# Patient Record
Sex: Female | Born: 1955
Health system: Southern US, Community
[De-identification: ages and names within clinical notes are randomized; demographics above are authoritative.]

## PROBLEM LIST (undated history)

## (undated) DIAGNOSIS — K219 Gastro-esophageal reflux disease without esophagitis: Secondary | ICD-10-CM

## (undated) DIAGNOSIS — M1990 Unspecified osteoarthritis, unspecified site: Secondary | ICD-10-CM

## (undated) DIAGNOSIS — M1612 Unilateral primary osteoarthritis, left hip: Secondary | ICD-10-CM

## (undated) DIAGNOSIS — R0602 Shortness of breath: Secondary | ICD-10-CM

## (undated) DIAGNOSIS — G5600 Carpal tunnel syndrome, unspecified upper limb: Secondary | ICD-10-CM

## (undated) DIAGNOSIS — R569 Unspecified convulsions: Secondary | ICD-10-CM

## (undated) DIAGNOSIS — I10 Essential (primary) hypertension: Secondary | ICD-10-CM

## (undated) DIAGNOSIS — K589 Irritable bowel syndrome without diarrhea: Secondary | ICD-10-CM

## (undated) DIAGNOSIS — T148XXA Other injury of unspecified body region, initial encounter: Secondary | ICD-10-CM

## (undated) HISTORY — PX: FOOT ARTHROTOMY: SUR104

## (undated) HISTORY — DX: Irritable bowel syndrome, unspecified: K58.9

## (undated) HISTORY — PX: ANTERIOR CERVICAL DECOMP/DISCECTOMY FUSION: SHX1161

## (undated) HISTORY — PX: OTHER SURGICAL HISTORY: SHX169

## (undated) HISTORY — PX: VAGINAL HYSTERECTOMY: SUR661

## (undated) HISTORY — DX: Carpal tunnel syndrome, unspecified upper limb: G56.00

## (undated) HISTORY — PX: JOINT REPLACEMENT: SHX530

---

## 1976-12-09 HISTORY — PX: TUBAL LIGATION: SHX77

## 2001-12-09 HISTORY — PX: CHOLECYSTECTOMY: SHX55

## 2002-03-30 ENCOUNTER — Encounter: Payer: Self-pay | Admitting: Unknown Physician Specialty

## 2002-03-30 ENCOUNTER — Ambulatory Visit (HOSPITAL_COMMUNITY): Admission: RE | Admit: 2002-03-30 | Discharge: 2002-03-30 | Payer: Self-pay | Admitting: Unknown Physician Specialty

## 2002-05-02 ENCOUNTER — Encounter: Payer: Self-pay | Admitting: Orthopaedic Surgery

## 2002-05-02 ENCOUNTER — Ambulatory Visit (HOSPITAL_COMMUNITY): Admission: RE | Admit: 2002-05-02 | Discharge: 2002-05-02 | Payer: Self-pay | Admitting: Orthopaedic Surgery

## 2002-05-31 ENCOUNTER — Inpatient Hospital Stay (HOSPITAL_COMMUNITY): Admission: RE | Admit: 2002-05-31 | Discharge: 2002-06-01 | Payer: Self-pay | Admitting: Neurosurgery

## 2002-10-12 ENCOUNTER — Encounter: Admission: RE | Admit: 2002-10-12 | Discharge: 2002-12-01 | Payer: Self-pay | Admitting: Neurosurgery

## 2003-04-01 ENCOUNTER — Encounter: Payer: Self-pay | Admitting: Unknown Physician Specialty

## 2003-04-01 ENCOUNTER — Ambulatory Visit (HOSPITAL_COMMUNITY): Admission: RE | Admit: 2003-04-01 | Discharge: 2003-04-01 | Payer: Self-pay | Admitting: Unknown Physician Specialty

## 2007-04-28 ENCOUNTER — Ambulatory Visit (HOSPITAL_COMMUNITY): Admission: RE | Admit: 2007-04-28 | Discharge: 2007-04-28 | Payer: Self-pay | Admitting: Family Medicine

## 2009-02-22 ENCOUNTER — Ambulatory Visit (HOSPITAL_COMMUNITY): Admission: RE | Admit: 2009-02-22 | Discharge: 2009-02-22 | Payer: Self-pay | Admitting: Family Medicine

## 2011-04-26 NOTE — Op Note (Signed)
. [facility]  Patient:    Kathy Barr, Kathy Barr Visit Number: [id number] MRN: [id number]          Service Type: SUR Location: [id number] Attending Physician:  Cristi Loron Dictated by:   Cristi Loron, M.D. Proc. Date: 05/31/02 Admit Date:  05/31/2002 Discharge Date: 06/01/2002                             Operative Report  BRIEF HISTORY:  The patient is a 55 year old white female who has suffered from neck and right arm pain.  She failed medical management and was worked up with a cervical MRI that demonstrated a herniated disk at C6-7 on the right. The patients signs, symptoms and physical examination are consistent with a right C7 radiculopathy.  She therefore weighed the risks, benefits and alternatives of surgery and decided to proceed with an anterior cervical diskectomy, fusion and plating.  PREOPERATIVE DIAGNOSES: 1. C6-7 herniated nucleus pulposus. 2. Spinal stenosis. 3. Degenerative disease. 4. Cervical radiculopathy and cervicalgia.  POSTOPERATIVE DIAGNOSES: 1. C6-7 herniated nucleus pulposus. 2. Spinal stenosis. 3. Degenerative disease. 4. Cervical radiculopathy and cervicalgia.  PROCEDURE: C6-7 extensive anterior cervical diskectomy, interbody iliac crest Allograft arthrodesis, anterior cervical plating (Synthes titanium plate and screws).  SURGEON:  Cristi Loron, M.D.  ASSISTANT:  Barnett Abu, M.D.  ANESTHESIA:  General endotracheal anesthesia.  ESTIMATED BLOOD LOSS: 100 cc.  SPECIMENS:  None.  DRAINS:  None.  COMPLICATIONS: None.  DESCRIPTION OF PROCEDURE: The patient was brought to the operating room by the anesthesia team.  General endotracheal anesthesia was induced.  The patient remained in the supine position. A roll was placed under her shoulder and placed in slight extension.  The anterior cervical region was then prepared with Betadine scrub and Betadine solution. Sterile drapes were  applied.  I then injected the area to be incised with Marcaine with epinephrine solution and then used the scalpel to make a transverse incision in the patients left anterior neck.  I used the Metzenbaum scissors to divide the platysma muscle and then dissect medial to the sternocleidomastoid muscle, jugular vein and carotid artery.  I carefully dissected down towards the anterior cervical spine carefully identifying the esophagus and retracting it medially.  I exposed the soft tissue from the anterior cervical spine using Kitner swabs and then inserted a bent spinal needle into the upper exposed interspace.  I then obtained an intraoperative radiograph to confirm the location and then used electrocautery to detach the medial border up the longus coli muscle bilaterally from the C6-7 intervertebral disk space.  I then inserted the Caspar self-retaining retractor for exposure and then incised the C6-7 intervertebral disk with a 15 blade scalpel.  I performed a partial diskectomy using the pituitary forceps and the Carlens curettes.  I inserted the distraction screws at C6 and C7, distracting the interspace.  I then brought the operative microscope into the field and under magnification and illumination, I completed the decompression.  I used a high speed drill decorticate the few membranes at C6-7 and drilled the remainder of the C6-7 intervertebral disk.  I thinned out the posterior longitudinal ligament with the drill and then incised it with a vascular knife and removed it with the Kerrison punch.  I then incised the vertebral end plates at C5-8 decompressing the thecal sac.  I performed a foraminotomy about the left C7 nerve root and then the right C7 nerve  root.  On the right there was a combination of spondylosis and a soft herniated disk which was compressing the right C7 nerve root significantly. I performed a generous foraminotomy about the nerve root getting a good  decompression.  Having completed the decompression of the thecal sac and the bilateral C7 nerve roots, I now turned my attention to the arthrodesis.  I obtained iliac crest tricortical Allograft bone graft and fashioned it to its approximate dimension, 7 mm in height and 1 cm in depth.  I placed the bone graft into the C6-7 intervertebral disk space.  Dr. Danielle Dess carefully tapped it into place using the mount and bone tamp.  I then removed the distraction screws.  There was a good snug fit of the bone graft.  I now turned my attention to the anterior spinal instrumentation.  I obtained the appropriate length of the Synthes anterior cervical plate and laid it along the anterior aspect of the vertebral bodies at C6 and C7 and while Dr. Danielle Dess held it in place, I drilled two holes at C6 and two at C7 and tapped the holes and secured the plate to the vertebral bodies using two 14 mm screws at each vertebral body.  We then obtained an intraoperative radiograph and it was suboptimal because of the patients body habitus, but the instrumentation looked good in vivo.  We then secured the screws to the plates and the locking screws at each screw.  I then achieved hemostasis using bipolar cautery.  I copiously irrigated the wound out with Bacitracin solution.  I removed the solution and then removed the Caspar self-retaining retractor.  I inspected the esophagus for any damage; there was none apparent. I then reapproximated the patients platysma muscle with the 3-0 Vicryl suture and subcutaneous tissues with 3-0 Vicryl suture and the skin with Steri-Strips and Benzoin. The wound was then covered with Bacitracin ointment and a sterile dressing was applied.  The drapes were removed.  The patient was subsequently extubated by the anesthesia team and transported to the post-anesthesia care unit in stable condition. All sponge, instrument and needle counts were correct at the end of the case. Dictated  by:   Cristi Loron, M.D. Attending Physician:  Tressie Stalker D DD:  05/31/02 TD:  06/01/02 Job: [id number]

## 2011-12-10 DIAGNOSIS — T148XXA Other injury of unspecified body region, initial encounter: Secondary | ICD-10-CM

## 2011-12-10 HISTORY — DX: Other injury of unspecified body region, initial encounter: T14.8XXA

## 2012-05-26 ENCOUNTER — Encounter (HOSPITAL_COMMUNITY): Payer: Self-pay | Admitting: Pharmacy Technician

## 2012-05-27 ENCOUNTER — Encounter (HOSPITAL_COMMUNITY): Payer: Self-pay

## 2012-05-27 ENCOUNTER — Encounter (HOSPITAL_COMMUNITY)
Admission: RE | Admit: 2012-05-27 | Discharge: 2012-05-27 | Disposition: A | Discharge status: Home / Self Care | Payer: PRIVATE HEALTH INSURANCE | Source: Ambulatory Visit | Attending: Physician Assistant | Admitting: Physician Assistant

## 2012-05-27 ENCOUNTER — Encounter (HOSPITAL_COMMUNITY)
Admission: RE | Admit: 2012-05-27 | Discharge: 2012-05-27 | Disposition: A | Discharge status: Home / Self Care | Payer: PRIVATE HEALTH INSURANCE | Source: Ambulatory Visit | Attending: Specialist | Admitting: Specialist

## 2012-05-27 DIAGNOSIS — Z01812 Encounter for preprocedural laboratory examination: Secondary | ICD-10-CM | POA: Insufficient documentation

## 2012-05-27 DIAGNOSIS — Z01818 Encounter for other preprocedural examination: Secondary | ICD-10-CM | POA: Insufficient documentation

## 2012-05-27 DIAGNOSIS — Z01810 Encounter for preprocedural cardiovascular examination: Secondary | ICD-10-CM | POA: Insufficient documentation

## 2012-05-27 DIAGNOSIS — R0602 Shortness of breath: Secondary | ICD-10-CM | POA: Insufficient documentation

## 2012-05-27 DIAGNOSIS — I10 Essential (primary) hypertension: Secondary | ICD-10-CM | POA: Insufficient documentation

## 2012-05-27 DIAGNOSIS — F17200 Nicotine dependence, unspecified, uncomplicated: Secondary | ICD-10-CM | POA: Insufficient documentation

## 2012-05-27 HISTORY — DX: Unspecified osteoarthritis, unspecified site: M19.90

## 2012-05-27 HISTORY — DX: Essential (primary) hypertension: I10

## 2012-05-27 HISTORY — DX: Shortness of breath: R06.02

## 2012-05-27 LAB — CBC
Hemoglobin: 13.5 g/dL (ref 12.0–15.0)
MCV: 82.8 fL (ref 78.0–100.0)
Platelets: 209 10*3/uL (ref 150–400)
RBC: 4.82 MIL/uL (ref 3.87–5.11)
WBC: 8.7 10*3/uL (ref 4.0–10.5)

## 2012-05-27 LAB — TYPE AND SCREEN: ABO/RH(D): O NEG

## 2012-05-27 LAB — COMPREHENSIVE METABOLIC PANEL
ALT: 13 U/L (ref 0–35)
AST: 17 U/L (ref 0–37)
CO2: 28 mEq/L (ref 19–32)
Chloride: 100 mEq/L (ref 96–112)
Creatinine, Ser: 0.66 mg/dL (ref 0.50–1.10)
GFR calc non Af Amer: >90 mL/min (ref >90)
Glucose, Bld: 86 mg/dL (ref 70–99)
Total Bilirubin: 0.9 mg/dL (ref 0.3–1.2)

## 2012-05-27 LAB — URINE MICROSCOPIC-ADD ON

## 2012-05-27 LAB — APTT: aPTT: 31 seconds (ref 24–37)

## 2012-05-27 LAB — URINALYSIS, ROUTINE W REFLEX MICROSCOPIC
Hgb urine dipstick: NEGATIVE
Protein, ur: NEGATIVE mg/dL
Urobilinogen, UA: 0.2 mg/dL (ref 0.0–1.0)

## 2012-05-27 NOTE — Progress Notes (Signed)
ekg at [facility]. ED for chest pain & told that it was muscle strain. >1 yr. Ago

## 2012-05-27 NOTE — Pre-Procedure Instructions (Signed)
20 SHATYRA BECKA  05/27/2012   Your procedure is scheduled on:  06/02/2012  Report to [facility] at 10:30 AM.  Call this number if you have problems the morning of surgery: [phone]   Remember:   Do not eat food:After Midnight.  MONDAY    Take these medicines the morning of surgery with A SIP OF WATER:  NOTHING   Do not wear jewelry, make-up or nail polish.  Do not wear lotions, powders, or perfumes. You may wear deodorant.  Do not shave 48 hours prior to surgery. Men may shave face and neck.  Do not bring valuables to the hospital.  Contacts, dentures or bridgework may not be worn into surgery.  Leave suitcase in the car. After surgery it may be brought to your room.  For patients admitted to the hospital, checkout time is 11:00 AM the day of discharge.   Patients discharged the day of surgery will not be allowed to drive home.  Name and phone number of your driver: /w boyfriend   Special Instructions: CHG Shower Use Special Wash: 1/2 bottle night before surgery and 1/2 bottle morning of surgery.   Please read over the following fact sheets that you were given: Pain Booklet, Coughing and Deep Breathing, Blood Transfusion Information, Total Joint Packet, MRSA Information and Surgical Site Infection Prevention

## 2012-06-01 DIAGNOSIS — M1612 Unilateral primary osteoarthritis, left hip: Secondary | ICD-10-CM | POA: Diagnosis present

## 2012-06-01 MED ORDER — CEFAZOLIN SODIUM-DEXTROSE 2-3 GM-% IV SOLR: 2.0000 g | INTRAVENOUS | Status: DC

## 2012-06-01 NOTE — H&P (Signed)
Kathy Barr is an 56 y.o. female.   Chief Complaint: left hip pain HPI: Pt with chronic and progressive pain in the left hip.  Now with worsening of her symptoms with inability to ambulate distances.  She has difficulty rising from a seated position and putting on her shoes.  Radiographs show severe degenerative changes of the left hip with loss of joint space.  She has failed conservative treatment with NSAIDS and activity modification.  She would like to proceed with left total hip replacement.  Past Medical History  Diagnosis Date  . Hypertension   . Shortness of breath   . Arthritis     L hip- osteoarthritis    Past Surgical History  Procedure Date  . Cervical fusion   . Tubal ligation     1978  . Foot arthrotomy     R foot - pin, after removing a bone   . Abdominal hysterectomy   . Cholecystectomy     2003    No family history on file. Social History:  reports that she has been smoking Cigarettes.  She has been smoking about 1 pack per day. She does not have any smokeless tobacco history on file. She reports that she does not drink alcohol or use illicit drugs.  Allergies: No Known Allergies  No prescriptions prior to admission    No results found for this or any previous visit (from the past 48 hour(s)). No results found.  Review of Systems  Constitutional: Negative.   HENT: Negative.   Eyes: Negative.   Cardiovascular: Negative.   Gastrointestinal: Negative.   Genitourinary: Negative.   Musculoskeletal: Positive for joint pain.       Left hip  Skin: Negative.   Neurological: Negative.   Endo/Heme/Allergies: Negative.   Psychiatric/Behavioral: Negative.     There were no vitals taken for this visit. Physical Exam  Constitutional: She is oriented to person, place, and time. She appears well-developed and well-nourished.  HENT:  Head: Normocephalic and atraumatic.  Eyes: EOM are normal. Pupils are equal, round, and reactive to light.  Neck: Normal range  of motion. Neck supple.  Cardiovascular: Normal rate, regular rhythm, normal heart sounds and intact distal pulses.   Respiratory: Effort normal and breath sounds normal.  GI: Soft. Bowel sounds are normal.  Musculoskeletal:       Limited and painful ROM left hip.  Pain radiating to groin and down anterior thigh with weight bearing of left hip  Neurological: She is alert and oriented to person, place, and time.  Skin: Skin is warm and dry.  Psychiatric: She has a normal mood and affect.     Assessment/Plan Osteoarthritis of left hip  PLAN:  Left total hip replacement.  Wende Neighbors 06/01/2012, 3:43 PM

## 2012-06-01 NOTE — H&P (Signed)
Reviewed, jen 

## 2012-06-02 ENCOUNTER — Encounter (HOSPITAL_COMMUNITY): Admission: RE | Payer: Self-pay | Source: Ambulatory Visit

## 2012-06-02 ENCOUNTER — Ambulatory Visit (HOSPITAL_COMMUNITY): Admission: RE | Admit: 2012-06-02 | Payer: PRIVATE HEALTH INSURANCE | Source: Ambulatory Visit | Admitting: Specialist

## 2012-06-02 SURGERY — ARTHROPLASTY, HIP, TOTAL,POSTERIOR APPROACH
Anesthesia: General | Laterality: Left

## 2012-06-16 ENCOUNTER — Encounter (HOSPITAL_COMMUNITY): Payer: Self-pay | Admitting: Pharmacy Technician

## 2012-06-24 ENCOUNTER — Inpatient Hospital Stay (HOSPITAL_COMMUNITY): Admission: RE | Admit: 2012-06-24 | Discharge: 2012-06-24 | Payer: PRIVATE HEALTH INSURANCE | Source: Ambulatory Visit

## 2012-06-24 ENCOUNTER — Encounter (HOSPITAL_COMMUNITY): Payer: Self-pay

## 2012-06-24 HISTORY — DX: Other injury of unspecified body region, initial encounter: T14.8XXA

## 2012-06-24 NOTE — Pre-Procedure Instructions (Signed)
20 Kathy Barr  06/24/2012   Your procedure is scheduled on:  06/30/12  Report to [facility] at 1000AM.  Call this number if you have problems the morning of surgery: [phone]   Remember:   Do not eat food or drink:After Midnight.  .  Take these medicines the morning of surgery with A SIP OF WATER:none   STOP naproxen, any other nsaids, aspirin, herbal meds ,blood thinners*   Do not wear jewelry, make-up or nail polish.  Do not wear lotions, powders, or perfumes. You may wear deodorant.  Do not shave 48 hours prior to surgery. Men may shave face and neck.  Do not bring valuables to the hospital.  Contacts, dentures or bridgework may not be worn into surgery.  Leave suitcase in the car. After surgery it may be brought to your room.  For patients admitted to the hospital, checkout time is 11:00 AM the day of discharge.   Patients discharged the day of surgery will not be allowed to drive home.  Name and phone number of your driver:  Special Instructions: Incentive Spirometry - Practice and bring it with you on the day of surgery. and CHG Shower Use Special Wash: 1/2 bottle night before surgery and 1/2 bottle morning of surgery.   Please read over the following fact sheets that you were given: Pain Booklet, Coughing and Deep Breathing, Blood Transfusion Information, Total Joint Packet, MRSA Information and Surgical Site Infection Prevention

## 2012-06-24 NOTE — Progress Notes (Addendum)
Patient told by sheila pa at office did not have to come back for preadmit appt Office also called for new orders. Spoke with sherry

## 2012-06-29 MED ORDER — CHLORHEXIDINE GLUCONATE 4 % EX LIQD: 60.0000 mL | Freq: Once | CUTANEOUS | Status: DC

## 2012-06-29 MED ORDER — CEFAZOLIN SODIUM-DEXTROSE 2-3 GM-% IV SOLR
2.0000 g | INTRAVENOUS | Status: AC
  Administered 2012-06-30: 2 g via INTRAVENOUS
  Filled 2012-06-29: qty 50

## 2012-06-30 ENCOUNTER — Inpatient Hospital Stay (HOSPITAL_COMMUNITY)
Admission: RE | Admit: 2012-06-30 | Discharge: 2012-07-03 | DRG: 470 | Disposition: A | Discharge status: Home Health | Payer: PRIVATE HEALTH INSURANCE | Source: Ambulatory Visit | Attending: Specialist | Admitting: Specialist

## 2012-06-30 ENCOUNTER — Inpatient Hospital Stay (HOSPITAL_COMMUNITY): Payer: PRIVATE HEALTH INSURANCE

## 2012-06-30 ENCOUNTER — Encounter (HOSPITAL_COMMUNITY)
Admission: RE | Disposition: A | Discharge status: Home Health | Payer: Self-pay | Source: Ambulatory Visit | Attending: Specialist

## 2012-06-30 ENCOUNTER — Encounter (HOSPITAL_COMMUNITY): Payer: Self-pay | Admitting: *Deleted

## 2012-06-30 ENCOUNTER — Encounter (HOSPITAL_COMMUNITY): Payer: Self-pay | Admitting: Anesthesiology

## 2012-06-30 ENCOUNTER — Ambulatory Visit (HOSPITAL_COMMUNITY): Payer: PRIVATE HEALTH INSURANCE | Admitting: Anesthesiology

## 2012-06-30 DIAGNOSIS — Z23 Encounter for immunization: Secondary | ICD-10-CM

## 2012-06-30 DIAGNOSIS — M1612 Unilateral primary osteoarthritis, left hip: Secondary | ICD-10-CM | POA: Diagnosis present

## 2012-06-30 DIAGNOSIS — Q762 Congenital spondylolisthesis: Secondary | ICD-10-CM

## 2012-06-30 DIAGNOSIS — Z79899 Other long term (current) drug therapy: Secondary | ICD-10-CM

## 2012-06-30 DIAGNOSIS — I10 Essential (primary) hypertension: Secondary | ICD-10-CM | POA: Diagnosis present

## 2012-06-30 DIAGNOSIS — D62 Acute posthemorrhagic anemia: Secondary | ICD-10-CM | POA: Diagnosis not present

## 2012-06-30 DIAGNOSIS — Z981 Arthrodesis status: Secondary | ICD-10-CM

## 2012-06-30 DIAGNOSIS — M87059 Idiopathic aseptic necrosis of unspecified femur: Secondary | ICD-10-CM | POA: Diagnosis present

## 2012-06-30 DIAGNOSIS — M169 Osteoarthritis of hip, unspecified: Principal | ICD-10-CM | POA: Diagnosis present

## 2012-06-30 DIAGNOSIS — F17200 Nicotine dependence, unspecified, uncomplicated: Secondary | ICD-10-CM | POA: Diagnosis present

## 2012-06-30 DIAGNOSIS — Z7901 Long term (current) use of anticoagulants: Secondary | ICD-10-CM

## 2012-06-30 DIAGNOSIS — M1610 Unilateral primary osteoarthritis, unspecified hip: Principal | ICD-10-CM | POA: Diagnosis present

## 2012-06-30 HISTORY — PX: TOTAL HIP ARTHROPLASTY: SHX124

## 2012-06-30 LAB — BASIC METABOLIC PANEL
CO2: 29 mEq/L (ref 19–32)
Calcium: 9.8 mg/dL (ref 8.4–10.5)
Creatinine, Ser: 0.67 mg/dL (ref 0.50–1.10)
GFR calc non Af Amer: >90 mL/min (ref >90)
Sodium: 142 mEq/L (ref 135–145)

## 2012-06-30 LAB — TYPE AND SCREEN
ABO/RH(D): O NEG
Antibody Screen: NEGATIVE

## 2012-06-30 LAB — CBC
MCH: 28.3 pg (ref 26.0–34.0)
MCHC: 34.5 g/dL (ref 30.0–36.0)
MCV: 81.9 fL (ref 78.0–100.0)
Platelets: 233 10*3/uL (ref 150–400)
RBC: 4.92 MIL/uL (ref 3.87–5.11)
RDW: 13.5 % (ref 11.5–15.5)

## 2012-06-30 LAB — PROTIME-INR
INR: 1.03 (ref 0.00–1.49)
Prothrombin Time: 13.7 seconds (ref 11.6–15.2)

## 2012-06-30 LAB — SURGICAL PCR SCREEN: MRSA, PCR: NEGATIVE

## 2012-06-30 SURGERY — ARTHROPLASTY, HIP, TOTAL,POSTERIOR APPROACH
Anesthesia: General | Site: Hip | Laterality: Left | Wound class: Clean

## 2012-06-30 MED ORDER — MUPIROCIN 2 % EX OINT
TOPICAL_OINTMENT | Freq: Two times a day (BID) | CUTANEOUS | Status: DC
  Administered 2012-06-30: 10:00:00 via NASAL

## 2012-06-30 MED ORDER — ACETAMINOPHEN 650 MG RE SUPP: 650.0000 mg | Freq: Four times a day (QID) | RECTAL | Status: DC | PRN

## 2012-06-30 MED ORDER — METHOCARBAMOL 100 MG/ML IJ SOLN
500.0000 mg | Freq: Four times a day (QID) | INTRAVENOUS | Status: DC | PRN
  Filled 2012-06-30: qty 5

## 2012-06-30 MED ORDER — ALUM & MAG HYDROXIDE-SIMETH 200-200-20 MG/5ML PO SUSP: 30.0000 mL | ORAL | Status: DC | PRN

## 2012-06-30 MED ORDER — METOCLOPRAMIDE HCL 10 MG PO TABS: 5.0000 mg | ORAL_TABLET | Freq: Three times a day (TID) | ORAL | Status: DC | PRN

## 2012-06-30 MED ORDER — ONDANSETRON HCL 4 MG/2ML IJ SOLN
INTRAMUSCULAR | Status: DC | PRN
  Administered 2012-06-30: 4 mg via INTRAVENOUS

## 2012-06-30 MED ORDER — ONDANSETRON HCL 4 MG/2ML IJ SOLN: 4.0000 mg | Freq: Four times a day (QID) | INTRAMUSCULAR | Status: DC | PRN

## 2012-06-30 MED ORDER — DEXTROSE 5 % IV SOLN
INTRAVENOUS | Status: DC | PRN
  Administered 2012-06-30: 13:00:00 via INTRAVENOUS

## 2012-06-30 MED ORDER — ENOXAPARIN SODIUM 30 MG/0.3ML ~~LOC~~ SOLN
30.0000 mg | Freq: Two times a day (BID) | SUBCUTANEOUS | Status: DC
Start: 2012-07-01 — End: 2012-07-03
  Administered 2012-07-01 – 2012-07-03 (×5): 30 mg via SUBCUTANEOUS
  Filled 2012-06-30 (×7): qty 0.3

## 2012-06-30 MED ORDER — CEFAZOLIN SODIUM 1-5 GM-% IV SOLN
1.0000 g | Freq: Four times a day (QID) | INTRAVENOUS | Status: AC
  Administered 2012-06-30 – 2012-07-01 (×2): 1 g via INTRAVENOUS
  Filled 2012-06-30 (×2): qty 50

## 2012-06-30 MED ORDER — OXYCODONE HCL 5 MG PO TABS
5.0000 mg | ORAL_TABLET | ORAL | Status: DC | PRN
  Administered 2012-07-01 – 2012-07-03 (×13): 10 mg via ORAL
  Filled 2012-06-30 (×13): qty 2

## 2012-06-30 MED ORDER — PROPOFOL 10 MG/ML IV EMUL
INTRAVENOUS | Status: DC | PRN
  Administered 2012-06-30: 160 mg via INTRAVENOUS

## 2012-06-30 MED ORDER — LIDOCAINE HCL (CARDIAC) 20 MG/ML IV SOLN
INTRAVENOUS | Status: DC | PRN
  Administered 2012-06-30: 100 mg via INTRAVENOUS

## 2012-06-30 MED ORDER — MORPHINE SULFATE (PF) 1 MG/ML IV SOLN
INTRAVENOUS | Status: DC
  Administered 2012-06-30: 25 mL via INTRAVENOUS
  Administered 2012-07-01: 05:00:00 via INTRAVENOUS
  Administered 2012-07-01: 3 mg via INTRAVENOUS
  Filled 2012-06-30: qty 25

## 2012-06-30 MED ORDER — HYDROMORPHONE HCL PF 1 MG/ML IJ SOLN
0.2500 mg | INTRAMUSCULAR | Status: DC | PRN
  Administered 2012-06-30 (×2): 0.5 mg via INTRAVENOUS

## 2012-06-30 MED ORDER — LACTATED RINGERS IV SOLN
INTRAVENOUS | Status: DC | PRN
  Administered 2012-06-30 (×2): via INTRAVENOUS

## 2012-06-30 MED ORDER — CHLORHEXIDINE GLUCONATE 4 % EX LIQD: 60.0000 mL | Freq: Once | CUTANEOUS | Status: DC

## 2012-06-30 MED ORDER — COUMADIN BOOK
Freq: Once | Status: DC
  Filled 2012-06-30: qty 1

## 2012-06-30 MED ORDER — WARFARIN - PHARMACIST DOSING INPATIENT: Freq: Every day | Status: DC

## 2012-06-30 MED ORDER — DIPHENHYDRAMINE HCL 12.5 MG/5ML PO ELIX: 12.5000 mg | ORAL_SOLUTION | ORAL | Status: DC | PRN

## 2012-06-30 MED ORDER — VALSARTAN-HYDROCHLOROTHIAZIDE 160-25 MG PO TABS: 1.0000 | ORAL_TABLET | Freq: Every day | ORAL | Status: DC

## 2012-06-30 MED ORDER — ONDANSETRON HCL 4 MG PO TABS: 4.0000 mg | ORAL_TABLET | Freq: Four times a day (QID) | ORAL | Status: DC | PRN

## 2012-06-30 MED ORDER — PHENOL 1.4 % MT LIQD: 1.0000 | OROMUCOSAL | Status: DC | PRN

## 2012-06-30 MED ORDER — HYDROCHLOROTHIAZIDE 25 MG PO TABS
25.0000 mg | ORAL_TABLET | Freq: Every day | ORAL | Status: DC
  Administered 2012-07-02: 25 mg via ORAL
  Filled 2012-06-30 (×3): qty 1

## 2012-06-30 MED ORDER — LACTATED RINGERS IV SOLN
INTRAVENOUS | Status: DC
  Administered 2012-06-30: 11:00:00 via INTRAVENOUS

## 2012-06-30 MED ORDER — METHOCARBAMOL 500 MG PO TABS
500.0000 mg | ORAL_TABLET | Freq: Four times a day (QID) | ORAL | Status: DC | PRN
  Administered 2012-07-01 (×2): 500 mg via ORAL
  Filled 2012-06-30 (×2): qty 1

## 2012-06-30 MED ORDER — SODIUM CHLORIDE 0.9 % IJ SOLN: 9.0000 mL | INTRAMUSCULAR | Status: DC | PRN

## 2012-06-30 MED ORDER — SODIUM CHLORIDE 0.9 % IR SOLN
Status: DC | PRN
  Administered 2012-06-30: 1000 mL

## 2012-06-30 MED ORDER — ACETAMINOPHEN 325 MG PO TABS: 650.0000 mg | ORAL_TABLET | Freq: Four times a day (QID) | ORAL | Status: DC | PRN

## 2012-06-30 MED ORDER — HYDROMORPHONE HCL PF 1 MG/ML IJ SOLN
INTRAMUSCULAR | Status: AC
  Filled 2012-06-30: qty 1

## 2012-06-30 MED ORDER — KETOROLAC TROMETHAMINE 30 MG/ML IJ SOLN
15.0000 mg | Freq: Once | INTRAMUSCULAR | Status: AC | PRN
  Administered 2012-06-30: 30 mg via INTRAVENOUS

## 2012-06-30 MED ORDER — NALOXONE HCL 0.4 MG/ML IJ SOLN: 0.4000 mg | INTRAMUSCULAR | Status: DC | PRN

## 2012-06-30 MED ORDER — PROMETHAZINE HCL 25 MG/ML IJ SOLN: 6.2500 mg | INTRAMUSCULAR | Status: DC | PRN

## 2012-06-30 MED ORDER — FENTANYL CITRATE 0.05 MG/ML IJ SOLN
INTRAMUSCULAR | Status: DC | PRN
  Administered 2012-06-30: 100 ug via INTRAVENOUS
  Administered 2012-06-30: 25 ug via INTRAVENOUS
  Administered 2012-06-30 (×2): 50 ug via INTRAVENOUS
  Administered 2012-06-30: 25 ug via INTRAVENOUS

## 2012-06-30 MED ORDER — WARFARIN SODIUM 7.5 MG PO TABS
7.5000 mg | ORAL_TABLET | Freq: Once | ORAL | Status: AC
  Administered 2012-06-30: 7.5 mg via ORAL
  Filled 2012-06-30: qty 1

## 2012-06-30 MED ORDER — MIDAZOLAM HCL 5 MG/5ML IJ SOLN
INTRAMUSCULAR | Status: DC | PRN
  Administered 2012-06-30 (×2): 0.5 mg via INTRAVENOUS

## 2012-06-30 MED ORDER — MENTHOL 3 MG MT LOZG: 1.0000 | LOZENGE | OROMUCOSAL | Status: DC | PRN

## 2012-06-30 MED ORDER — KETOROLAC TROMETHAMINE 30 MG/ML IJ SOLN
INTRAMUSCULAR | Status: AC
  Filled 2012-06-30: qty 1

## 2012-06-30 MED ORDER — MUPIROCIN 2 % EX OINT
TOPICAL_OINTMENT | CUTANEOUS | Status: AC
  Filled 2012-06-30: qty 22

## 2012-06-30 MED ORDER — MEPERIDINE HCL 25 MG/ML IJ SOLN: 6.2500 mg | INTRAMUSCULAR | Status: DC | PRN

## 2012-06-30 MED ORDER — POLYETHYLENE GLYCOL 3350 17 G PO PACK: 17.0000 g | PACK | Freq: Every day | ORAL | Status: DC | PRN

## 2012-06-30 MED ORDER — IRBESARTAN 150 MG PO TABS
150.0000 mg | ORAL_TABLET | Freq: Every day | ORAL | Status: DC
  Administered 2012-07-02: 150 mg via ORAL
  Filled 2012-06-30 (×3): qty 1

## 2012-06-30 MED ORDER — POTASSIUM CHLORIDE IN NACL 20-0.9 MEQ/L-% IV SOLN
INTRAVENOUS | Status: DC
  Administered 2012-06-30 – 2012-07-01 (×2): via INTRAVENOUS
  Filled 2012-06-30 (×7): qty 1000

## 2012-06-30 MED ORDER — TEMAZEPAM 15 MG PO CAPS: 15.0000 mg | ORAL_CAPSULE | Freq: Every evening | ORAL | Status: DC | PRN

## 2012-06-30 MED ORDER — DIPHENHYDRAMINE HCL 50 MG/ML IJ SOLN: 12.5000 mg | Freq: Four times a day (QID) | INTRAMUSCULAR | Status: DC | PRN

## 2012-06-30 MED ORDER — ROCURONIUM BROMIDE 100 MG/10ML IV SOLN
INTRAVENOUS | Status: DC | PRN
  Administered 2012-06-30: 50 mg via INTRAVENOUS
  Administered 2012-06-30: 10 mg via INTRAVENOUS

## 2012-06-30 MED ORDER — WARFARIN VIDEO: Freq: Once | Status: DC

## 2012-06-30 MED ORDER — SORBITOL 70 % SOLN: 30.0000 mL | Freq: Every day | Status: DC | PRN

## 2012-06-30 MED ORDER — MORPHINE SULFATE 2 MG/ML IJ SOLN: 1.0000 mg | INTRAMUSCULAR | Status: DC | PRN

## 2012-06-30 MED ORDER — MORPHINE SULFATE (PF) 1 MG/ML IV SOLN
INTRAVENOUS | Status: AC
  Filled 2012-06-30: qty 25

## 2012-06-30 MED ORDER — DOCUSATE SODIUM 100 MG PO CAPS
100.0000 mg | ORAL_CAPSULE | Freq: Two times a day (BID) | ORAL | Status: DC
  Administered 2012-06-30 – 2012-07-03 (×6): 100 mg via ORAL
  Filled 2012-06-30 (×7): qty 1

## 2012-06-30 MED ORDER — GABAPENTIN 300 MG PO CAPS
300.0000 mg | ORAL_CAPSULE | Freq: Every day | ORAL | Status: DC
  Administered 2012-06-30 – 2012-07-02 (×3): 300 mg via ORAL
  Filled 2012-06-30 (×4): qty 1

## 2012-06-30 MED ORDER — METOCLOPRAMIDE HCL 5 MG/ML IJ SOLN
5.0000 mg | Freq: Three times a day (TID) | INTRAMUSCULAR | Status: DC | PRN
  Administered 2012-06-30: 10 mg via INTRAVENOUS

## 2012-06-30 MED ORDER — DIPHENHYDRAMINE HCL 12.5 MG/5ML PO ELIX: 12.5000 mg | ORAL_SOLUTION | Freq: Four times a day (QID) | ORAL | Status: DC | PRN

## 2012-06-30 MED ORDER — CELECOXIB 200 MG PO CAPS
200.0000 mg | ORAL_CAPSULE | Freq: Two times a day (BID) | ORAL | Status: DC
  Administered 2012-06-30 – 2012-07-03 (×6): 200 mg via ORAL
  Filled 2012-06-30 (×7): qty 1

## 2012-06-30 SURGICAL SUPPLY — 64 items
ADH SKN CLS APL DERMABOND .7 (GAUZE/BANDAGES/DRESSINGS) ×1
APL SKNCLS STERI-STRIP NONHPOA (GAUZE/BANDAGES/DRESSINGS) ×1
BENZOIN TINCTURE PRP APPL 2/3 (GAUZE/BANDAGES/DRESSINGS) ×2 IMPLANT
BLADE SAW SAG 73X25 THK (BLADE) ×1
BLADE SAW SGTL 73X25 THK (BLADE) ×1 IMPLANT
BRUSH FEMORAL CANAL (MISCELLANEOUS) IMPLANT
CLOTH BEACON ORANGE TIMEOUT ST (SAFETY) ×2 IMPLANT
COVER BACK TABLE 24X17X13 BIG (DRAPES) ×1 IMPLANT
COVER SURGICAL LIGHT HANDLE (MISCELLANEOUS) ×2 IMPLANT
DERMABOND ADVANCED (GAUZE/BANDAGES/DRESSINGS) ×1
DERMABOND ADVANCED .7 DNX12 (GAUZE/BANDAGES/DRESSINGS) ×1 IMPLANT
DRAPE INCISE IOBAN 66X45 STRL (DRAPES) ×2 IMPLANT
DRAPE ORTHO SPLIT 77X108 STRL (DRAPES) ×4
DRAPE SURG ORHT 6 SPLT 77X108 (DRAPES) ×2 IMPLANT
DRAPE U-SHAPE 47X51 STRL (DRAPES) ×2 IMPLANT
DRILL BIT 7/64X5 (BIT) ×2 IMPLANT
DRSG MEPILEX BORDER 4X12 (GAUZE/BANDAGES/DRESSINGS) ×1 IMPLANT
DRSG MEPILEX BORDER 4X4 (GAUZE/BANDAGES/DRESSINGS) ×1 IMPLANT
DRSG MEPILEX BORDER 4X8 (GAUZE/BANDAGES/DRESSINGS) ×2 IMPLANT
DURAPREP 26ML APPLICATOR (WOUND CARE) ×2 IMPLANT
ELECT BLADE 6.5 EXT (BLADE) IMPLANT
ELECT REM PT RETURN 9FT ADLT (ELECTROSURGICAL) ×2
ELECTRODE REM PT RTRN 9FT ADLT (ELECTROSURGICAL) ×1 IMPLANT
EVACUATOR 1/8 PVC DRAIN (DRAIN) ×2 IMPLANT
FACESHIELD LNG OPTICON STERILE (SAFETY) ×4 IMPLANT
FILTER STRAW FLUID ASPIR (MISCELLANEOUS) ×2 IMPLANT
GLOVE BIOGEL PI IND STRL 7.5 (GLOVE) ×1 IMPLANT
GLOVE BIOGEL PI INDICATOR 7.5 (GLOVE) ×1
GLOVE ECLIPSE 7.0 STRL STRAW (GLOVE) ×2 IMPLANT
GLOVE ECLIPSE 8.5 STRL (GLOVE) ×2 IMPLANT
GLOVE SURG 8.5 LATEX PF (GLOVE) ×2 IMPLANT
GOWN PREVENTION PLUS LG XLONG (DISPOSABLE) ×2 IMPLANT
GOWN PREVENTION PLUS XXLARGE (GOWN DISPOSABLE) ×4 IMPLANT
GOWN STRL NON-REIN LRG LVL3 (GOWN DISPOSABLE) ×4 IMPLANT
HANDPIECE INTERPULSE COAX TIP (DISPOSABLE)
IMMOBILIZER KNEE 20 (SOFTGOODS) ×2
IMMOBILIZER KNEE 20 THIGH 36 (SOFTGOODS) ×1 IMPLANT
KIT BASIN OR (CUSTOM PROCEDURE TRAY) ×2 IMPLANT
KIT ROOM TURNOVER OR (KITS) ×2 IMPLANT
MANIFOLD NEPTUNE II (INSTRUMENTS) ×2 IMPLANT
NEEDLE 22X1 1/2 (OR ONLY) (NEEDLE) ×2 IMPLANT
NS IRRIG 1000ML POUR BTL (IV SOLUTION) ×2 IMPLANT
PACK TOTAL JOINT (CUSTOM PROCEDURE TRAY) ×2 IMPLANT
PAD ARMBOARD 7.5X6 YLW CONV (MISCELLANEOUS) ×4 IMPLANT
PASSER SUT SWANSON 36MM LOOP (INSTRUMENTS) ×2 IMPLANT
PRESSURIZER FEMORAL UNIV (MISCELLANEOUS) IMPLANT
SET HNDPC FAN SPRY TIP SCT (DISPOSABLE) IMPLANT
SPONGE LAP 4X18 X RAY DECT (DISPOSABLE) ×2 IMPLANT
STRIP CLOSURE SKIN 1/2X4 (GAUZE/BANDAGES/DRESSINGS) ×4 IMPLANT
SUCTION FRAZIER TIP 10 FR DISP (SUCTIONS) ×2 IMPLANT
SUT ETHIBOND NAB CT1 #1 30IN (SUTURE) ×12 IMPLANT
SUT VIC AB 0 CT1 27 (SUTURE) ×4
SUT VIC AB 0 CT1 27XBRD ANBCTR (SUTURE) ×2 IMPLANT
SUT VIC AB 1 CT1 27 (SUTURE) ×4
SUT VIC AB 1 CT1 27XBRD ANBCTR (SUTURE) ×2 IMPLANT
SUT VIC AB 2-0 CT1 27 (SUTURE) ×4
SUT VIC AB 2-0 CT1 TAPERPNT 27 (SUTURE) ×2 IMPLANT
SUT VICRYL 4-0 PS2 18IN ABS (SUTURE) ×2 IMPLANT
SYR CONTROL 10ML LL (SYRINGE) ×2 IMPLANT
TOWEL OR 17X24 6PK STRL BLUE (TOWEL DISPOSABLE) ×2 IMPLANT
TOWEL OR 17X26 10 PK STRL BLUE (TOWEL DISPOSABLE) ×2 IMPLANT
TOWER CARTRIDGE SMART MIX (DISPOSABLE) IMPLANT
TRAY FOLEY CATH 14FR (SET/KITS/TRAYS/PACK) ×2 IMPLANT
WATER STERILE IRR 1000ML POUR (IV SOLUTION) ×8 IMPLANT

## 2012-06-30 NOTE — Op Note (Signed)
06/30/2012  2:32 PM  OPERATIVE REPORT   PATIENT:  Kathy Barr  56 y.o. female  MRN: [id number]  PRE-OPERATIVE DIAGNOSIS:  Severe osteoarthritis left hip  POST-OPERATIVE DIAGNOSIS:  Severe osteoarthritis left hip  PROCEDURE:  Procedure(s): TOTAL HIP ARTHROPLASTY Left, Depuy summit #4 pore coated femoral component with 54mm pinnacle pore coated acetabular shell +4 10 degree acetabular poly liner, -2mm 36mm femoral neck and head.     SURGEON: Kerrin Champagne, MD    ASSISTANT: Maud Deed, PA-C  (Present throughout the entire procedure and necessary for completion of procedure in a timely manner)     ANESTHESIA:  General with supplemental femoral nerve block.     COMPLICATIONS:  None.     COMPONENTS:  DePuy Summit press fit #4 femoral component, a 36 mm  outer diameter hip ball with a -2mm neck length, a 54mm outer  diameter Pinnacle acetabulum, and a polyethylene liner +4, 10-degree   posterior lip.  Components were Press-Fit.    PROCEDURE IN DETAIL: The patient was met in the holding area and  identified. The appropriate left hip was identified and marked at the operative site. Preoperative antibiotics 2 g Ancef were given . The patient was then transported to the OR and  placed under general l anesthesia. At that point, the patient was  placed in the lateral decubitus position with the operative left side up and  secured to the operating room table with the Innomed hip system.  The operative left lower extremity was prepped from the iliac crest to the distal  leg with DuraPrep. Sterile draping was  performed.  A routine southern incision was utilized and via sharp dissection  carried down to the subcutaneous tissue. Gross bleeders were Bovie  coagulated. The iliotibial band was quickly identified and incised  along the length of the skin incision. Self-retaining retractors were  inserted. With the hip internally rotated, the short external rotators  were identified.  Tendinous structures were tagged with 0 Ethibond  suture. The hip capsule was identified and incised along the femoral  neck and head. There was a moderate clear yellow joint effusion. Hip was  dislocated posteriorly. It was misshapen and subchondral fracture with cartilage  flap. The femoral neck was then osteotomized using a  calcar guide and removed from the wound. Labral resection was performed  from the acetabulum.  The femoral neck osteotomy was placed about 5 mm proximal to the lesser trochanter.  A starter hole was then made through the piriformis fossa. Canal finder was utilized then lateralizing reamer. Reaming was  performed to the appropriate size #4. I had nice endosteal purchase. Rasping was performed sequentially to the appropriate #4 uncemented rasp.  Retractors were then placed about the acetabulum. Further capsule section  was performed. There was a large degenerative labrum that was also  excised. Retractor was placed about the acetabulum. It was somewhat  misshapen because of the superior migration of the femoral head.  Reaming was performed sequentially to the 53mm size.  It had very nice bleeding circumferentially and a nice strong thick  acetabulum. I then trialed the 52 mm acetabular component. It had  complete seating. Accordingly, the trial acetabular component was  impacted into the acetabulum. It was a very nice fit. It was nice and  stable.  The trial 10 polyethylene liner was inserted followed by the femoral rasp. We trialed a number of neck lengths and felt like trial -2 mm neck and 36 mm ball was the  most stable. At that point, there was minimal toggling and complete stability. Leg lengths were appropriate.  The trial components were then removed. The joint was copiously  irrigated with saline solution. A permanent Pinnacle porous-coated 54mm acetabular implant was then impacted into place observed to be fully seated in approximately 30 of abduction and 20  anteversion. Apex hole eliminator was inserted into  the acetabular component followed by the final Marathon 10 polyethylene liner.  The femoral component was then impacted onto the calcar. Wound was again irrigated.  The trial head was then removed. We cleaned the [facility] taper neck and  inserted the final head. This was reduced, and through a full range of motion, it was perfectly stable and there was no subluxation. There was no evidence of  instability. It had a very nice construct.  Wound was then irrigated with saline solution. The capsule was closed  anatomically with #1 Ethibond. The short external rotators were closed  with similar material through drill hole to the greater trochanter using a suture passer to pass the suture. The wound was again irrigated with saline  solution. The iliotibial band was closed with reluctant #1 Ethibond, subcu  was closed with #1 Vicryl and 0 Vicryl, subcutaneous layers were reapproximated with interrupted 0 and 2-0 Vicryl sutures.Skin was closed with skin  clips. All instruments and sponge counts were correct Mepilex dressing was applied and held in place with Hypafix tape. Knee immobilizer was placed. The patient was then placed in the supine position,  awoken, placed on the operating stretcher, and returned to the  postanesthesia recovery room in satisfactory condition.   NITKA,JAMES E  06/30/2012 2:32 PM

## 2012-06-30 NOTE — Preoperative (Signed)
Beta Blockers   Reason not to administer Beta Blockers:Not Applicable 

## 2012-06-30 NOTE — Transfer of Care (Signed)
Immediate Anesthesia Transfer of Care Note  Patient: Kathy Barr  Procedure(s) Performed: Procedure(s) (LRB): TOTAL HIP ARTHROPLASTY (Left)  Patient Location: PACU  Anesthesia Type: General  Level of Consciousness: awake and sedated  Airway & Oxygen Therapy: Patient Spontanous Breathing and Patient connected to nasal cannula oxygen  Post-op Assessment: Report given to PACU RN, Post -op Vital signs reviewed and stable and Patient moving all extremities  Post vital signs: Reviewed and stable  Complications: No apparent anesthesia complications

## 2012-06-30 NOTE — Brief Op Note (Signed)
06/30/2012  2:26 PM  PATIENT:  Kathy Barr  56 y.o. female  PRE-OPERATIVE DIAGNOSIS:  Severe osteoarthritis left hip  POST-OPERATIVE DIAGNOSIS:  Severe osteoarthritis left hip  PROCEDURE:  Procedure(s) (LRB): TOTAL HIP ARTHROPLASTY (Left)Depuy summit #4 pore coated femoral component with 54mm pinnacle acetabular shell +4 10degree acetabular poly liner, -2mm 36mm femoral neck and head.  SURGEON:  Surgeon(s) and Role:    * Kerrin Champagne, MD - Primary  PHYSICIAN ASSISTANT: Maud Deed PA-C  ANESTHESIA:   general, Dr. Jacklynn Bue  EBL:  Total I/O In: 1050 [I.V.:1050] Out: 575 [Urine:300; Blood:275]  BLOOD ADMINISTERED:none  DRAINS: (one ) Hemovact drain(s) in the left hip with  Suction Open and Urinary Catheter (Foley)   LOCAL MEDICATIONS USED:  NONE  DISPOSITION OF SPECIMEN:  PATHOLOGY  COUNTS:  YES  TOURNIQUET:  * No tourniquets in log *  DICTATION: .Dragon Dictation  PLAN OF CARE: Admit to inpatient   PATIENT DISPOSITION:  PACU - hemodynamically stable.   Delay start of Pharmacological VTE agent (>24hrs) due to surgical blood loss or risk of bleeding: no

## 2012-06-30 NOTE — Anesthesia Preprocedure Evaluation (Signed)
Anesthesia Evaluation  Patient identified by MRN, date of birth, ID band Patient awake    Reviewed: Allergy & Precautions, H&P , NPO status   History of Anesthesia Complications Negative for: history of anesthetic complications  Airway Mallampati: I  Neck ROM: Full    Dental  (+) Edentulous Upper and Edentulous Lower   Pulmonary neg pulmonary ROS, shortness of breath,  breath sounds clear to auscultation        Cardiovascular hypertension, Rhythm:Regular Rate:Normal     Neuro/Psych    GI/Hepatic hiatal hernia,   Endo/Other    Renal/GU      Musculoskeletal   Abdominal (+) + obese,   Peds  Hematology   Anesthesia Other Findings   Reproductive/Obstetrics                           Anesthesia Physical Anesthesia Plan  ASA: II  Anesthesia Plan: General   Post-op Pain Management:    Induction: Intravenous  Airway Management Planned: Oral ETT  Additional Equipment:   Intra-op Plan:   Post-operative Plan: Extubation in OR  Informed Consent: I have reviewed the patients History and Physical, chart, labs and discussed the procedure including the risks, benefits and alternatives for the proposed anesthesia with the patient or authorized representative who has indicated his/her understanding and acceptance.   Dental advisory given  Plan Discussed with: CRNA and Surgeon  Anesthesia Plan Comments:         Anesthesia Quick Evaluation

## 2012-06-30 NOTE — Progress Notes (Addendum)
ANTICOAGULATION CONSULT NOTE - Initial Consult  Pharmacy Consult for coumadin Indication: VTE prophylaxis  No Known Allergies  Patient Measurements: Height: 5\' 5"  (165.1 cm) Weight: 188 lb (85.276 kg) IBW/kg (Calculated) : 57  Heparin Dosing Weight:   Vital Signs: Temp: 97 F (36.1 C) (07/23 1454) Temp src: Oral (07/23 1006) BP: 105/71 mmHg (07/23 1554) Pulse Rate: 93  (07/23 1600)  Labs:  Basename 06/30/12 1010  HGB 13.9  HCT 40.3  PLT 233  APTT --  LABPROT --  INR --  HEPARINUNFRC --  CREATININE 0.67  CKTOTAL --  CKMB --  TROPONINI --    Estimated Creatinine Clearance: 83.7 ml/min (by C-G formula based on Cr of 0.67).   Medical History: Past Medical History  Diagnosis Date  . Hypertension   . Shortness of breath   . Arthritis     L hip- osteoarthritis  . Fracture     ? rt lower leg per patient    Medications:  Scheduled:    .  ceFAZolin (ANCEF) IV  1 g Intravenous Q6H  .  ceFAZolin (ANCEF) IV  2 g Intravenous 60 min Pre-Op  . celecoxib  200 mg Oral Q12H  . docusate sodium  100 mg Oral BID  . enoxaparin (LOVENOX) injection  30 mg Subcutaneous Q12H  . gabapentin  300 mg Oral QHS  . HYDROmorphone      . ketorolac      . morphine   Intravenous Q4H  . morphine      . valsartan-hydrochlorothiazide  1 tablet Oral QAC breakfast  . DISCONTD: chlorhexidine  60 mL Topical Once  . DISCONTD: chlorhexidine  60 mL Topical Once  . DISCONTD: mupirocin ointment   Nasal BID   Infusions:    . 0.9 % NaCl with KCl 20 mEq / L    . lactated ringers 50 mL/hr at 06/30/12 1103    Assessment: 56 yo female s/p total hip arthroplasty will be started on coumadin for VTE prophylaxis.  Not on coumadin prior to admission.  Last PT/INR was in June 2013 and was <1.  Coumadin score = 4.  INR today 1.03  Goal of Therapy:  INR 2-3 Monitor platelets by anticoagulation protocol: Yes   Plan:  1) Coumadin 7.5mg  po x1 2) Stat PT/INR 3) Daily PT/INR 4) Coumadin booklet and  video. 5) D/c lovenox when INR >2  Perry Molla, Tsz-Yin 06/30/2012,4:46 PM

## 2012-06-30 NOTE — Progress Notes (Signed)
Orthopedic Tech Progress Note Patient Details:  Kathy Barr February 25, 1956 [id number] Applied overhead frame and trapeze bar. Patient ID: [name], female   DOB: 1956/11/02, 56 y.o.   MRN: [id number]   Nyana, Haren 06/30/2012, 7:38 PM

## 2012-06-30 NOTE — Anesthesia Procedure Notes (Signed)
Procedure Name: Intubation Date/Time: 06/30/2012 12:41 PM Performed by: Marni Griffon Pre-anesthesia Checklist: Patient identified, Emergency Drugs available, Suction available and Patient being monitored Patient Re-evaluated:Patient Re-evaluated prior to inductionOxygen Delivery Method: Circle system utilized Preoxygenation: Pre-oxygenation with 100% oxygen Intubation Type: IV induction Ventilation: Mask ventilation without difficulty Laryngoscope Size: Mac and 3 Grade View: Grade I Tube type: Oral Tube size: 7.5 mm Number of attempts: 1 Airway Equipment and Method: Stylet Placement Confirmation: ETT inserted through vocal cords under direct vision,  breath sounds checked- equal and bilateral and positive ETCO2 Secured at: 21 (cm at gum) cm Dental Injury: Teeth and Oropharynx as per pre-operative assessment

## 2012-06-30 NOTE — Progress Notes (Signed)
Spoke with Kathy Barr states that urine culture will be obtain in OR

## 2012-06-30 NOTE — H&P (Signed)
Kathy Barr is an 56 y.o. female.   Chief Complaint:Left hip pain. HPI: 55 year old female with several year history of left hip pain. Work up of low back with L4-5 spondylolisthesis Complains of intermittant neurogenic symptoms. No bowel or bladder symptoms. MRI of the left hip and xrays show severe Arthrosis changes associated with AVN of the left femoral head. Intra articular injection with only temporal relief of Pain. Severe limitation of standing walking and stair climbing and ability to reach her feet. She uses assist devices A cane for ambulation. Requires narcotic medication to relief her pain.  Past Medical History  Diagnosis Date  . Hypertension   . Shortness of breath   . Arthritis     L hip- osteoarthritis  . Fracture     ? rt lower leg per patient    Past Surgical History  Procedure Date  . Cervical fusion   . Tubal ligation     1978  . Foot arthrotomy     R foot - pin, after removing a bone   . Abdominal hysterectomy   . Cholecystectomy     2003    History reviewed. No pertinent family history. Social History:  reports that she has been smoking Cigarettes.  She has a 25 pack-year smoking history. She does not have any smokeless tobacco history on file. She reports that she does not drink alcohol or use illicit drugs.  Allergies: No Known Allergies  Medications Prior to Admission  Medication Sig Dispense Refill  . gabapentin (NEURONTIN) 300 MG capsule Take 300 mg by mouth at bedtime.      Marland Kitchen HYDROcodone-acetaminophen (NORCO/VICODIN) 5-325 MG per tablet Take 1 tablet by mouth every 6 (six) hours as needed. Q4-6 hours PRN      . naproxen sodium (ANAPROX) 220 MG tablet Take 440 mg by mouth 2 (two) times daily with a meal.      . valsartan-hydrochlorothiazide (DIOVAN-HCT) 160-25 MG per tablet Take 1 tablet by mouth daily before breakfast.         Results for orders placed during the hospital encounter of 06/30/12 (from the past 48 hour(s))  SURGICAL PCR SCREEN      Status: Normal   Collection Time   06/30/12 10:09 AM      Component Value Range Comment   MRSA, PCR NEGATIVE  NEGATIVE    Staphylococcus aureus NEGATIVE  NEGATIVE   TYPE AND SCREEN     Status: Normal   Collection Time   06/30/12 10:10 AM      Component Value Range Comment   ABO/RH(D) O NEG      Antibody Screen NEG      Sample Expiration 07/03/2012     CBC     Status: Normal   Collection Time   06/30/12 10:10 AM      Component Value Range Comment   WBC 8.5  4.0 - 10.5 K/uL    RBC 4.92  3.87 - 5.11 MIL/uL    Hemoglobin 13.9  12.0 - 15.0 g/dL    HCT 56.2  13.0 - 86.5 %    MCV 81.9  78.0 - 100.0 fL    MCH 28.3  26.0 - 34.0 pg    MCHC 34.5  30.0 - 36.0 g/dL    RDW 78.4  69.6 - 29.5 %    Platelets 233  150 - 400 K/uL   BASIC METABOLIC PANEL     Status: Abnormal   Collection Time   06/30/12 10:10 AM  Component Value Range Comment   Sodium 142  135 - 145 mEq/L    Potassium 3.4 (*) 3.5 - 5.1 mEq/L    Chloride 102  96 - 112 mEq/L    CO2 29  19 - 32 mEq/L    Glucose, Bld 109 (*) 70 - 99 mg/dL    BUN 8  6 - 23 mg/dL    Creatinine, Ser 1.61  0.50 - 1.10 mg/dL    Calcium 9.8  8.4 - 09.6 mg/dL    GFR calc non Af Amer >90  >90 mL/min    GFR calc Af Amer >90  >90 mL/min    No results found.  Review of Systems  Constitutional: Negative.   HENT: Negative.   Eyes: Negative.   Respiratory: Negative.   Cardiovascular: Negative.   Gastrointestinal: Negative.   Genitourinary: Negative.   Musculoskeletal: Negative.   Skin: Negative.   Neurological: Positive for tingling. Negative for dizziness, tremors, sensory change, speech change, focal weakness, seizures and loss of consciousness.  Endo/Heme/Allergies: Negative for environmental allergies and polydipsia. Does not bruise/bleed easily.  Psychiatric/Behavioral: Negative.     Blood pressure 134/83, pulse 89, temperature 98.3 F (36.8 C), temperature source Oral, resp. rate 18, height 5\' 5"  (1.651 m), weight 85.276 kg (188 lb),  SpO2 93.00%. Physical Exam  Constitutional: She is oriented to person, place, and time. She appears well-developed and well-nourished. No distress.  HENT:  Head: Normocephalic and atraumatic.  Left Ear: External ear normal.  Nose: Nose normal.  Mouth/Throat: Oropharynx is clear and moist. No oropharyngeal exudate.  Eyes: Conjunctivae and EOM are normal. Pupils are equal, round, and reactive to light. Right eye exhibits no discharge. Left eye exhibits no discharge. No scleral icterus.  Neck: Normal range of motion. Neck supple. No JVD present. No tracheal deviation present. No thyromegaly present.  Cardiovascular: Normal rate, regular rhythm, normal heart sounds and intact distal pulses.  Exam reveals no gallop and no friction rub.   No murmur heard. Respiratory: Effort normal. No stridor. No respiratory distress. She has wheezes. She has no rales. She exhibits no tenderness.  GI: Soft. Bowel sounds are normal. She exhibits no distension and no mass. There is no tenderness. There is no rebound and no guarding.  Musculoskeletal: She exhibits tenderness. She exhibits no edema.  Lymphadenopathy:    She has no cervical adenopathy.  Neurological: She is alert and oriented to person, place, and time. She has normal reflexes. She displays normal reflexes. No cranial nerve deficit. She exhibits normal muscle tone. Coordination normal.  Skin: Skin is warm and dry. No rash noted. She is not diaphoretic. No erythema. No pallor.  Psychiatric: She has a normal mood and affect. Her behavior is normal. Judgment and thought content normal.  Orthopaedic Exam: Left hip with flexion 80 dgrees, IR is 5 degrees short of neutral ER is 30 degrees there is grating with ROM. Xray with severe joint line narrowing and cystic and sclerotic changes of both the femoral head and the acetabulum.   Assessment/Plan AVN left hip with severe osteoarthritis changes L4-5 Spondylolisthesis with right L5 and L4  radiculopathy.  Left total hip replacement for severe arthrosis of the left hip.  Jerod Mcquain E 06/30/2012, 12:07 PM

## 2012-06-30 NOTE — H&P (Signed)
Patient was seen and examined in the preop holding area. There has been no interval  Change in this patient's exam preop  history and physical exam  Lab tests and images have been examined and reviewed.  The Risks benefits and alternative treatments have been discussed  extensively,questions answered.  The patient has elected to undergo the discussed surgical treatment. 

## 2012-06-30 NOTE — H&P (Deleted)
Kathy Barr is an 56 y.o. female.   Chief Complaint: l HPI: l  Past Medical History  Diagnosis Date  . Hypertension   . Shortness of breath   . Arthritis     L hip- osteoarthritis  . Fracture     ? rt lower leg per patient    Past Surgical History  Procedure Date  . Cervical fusion   . Tubal ligation     1978  . Foot arthrotomy     R foot - pin, after removing a bone   . Abdominal hysterectomy   . Cholecystectomy     2003    No family history on file. Social History:  reports that she has been smoking Cigarettes.  She has been smoking about 1 pack per day. She does not have any smokeless tobacco history on file. She reports that she does not drink alcohol or use illicit drugs.  Allergies: No Known Allergies  No prescriptions prior to admission    No results found for this or any previous visit (from the past 48 hour(s)). No results found.  Review of Systems    There were no vitals taken for this visit. Physical Exam     Assessment/Plan    Alexie Samson M 06/30/2012, 8:01 AM

## 2012-07-01 ENCOUNTER — Encounter (HOSPITAL_COMMUNITY): Payer: Self-pay | Admitting: Specialist

## 2012-07-01 LAB — CBC
MCH: 28.1 pg (ref 26.0–34.0)
MCHC: 34 g/dL (ref 30.0–36.0)
Platelets: 187 10*3/uL (ref 150–400)

## 2012-07-01 LAB — PROTIME-INR: Prothrombin Time: 15.7 seconds — ABNORMAL HIGH (ref 11.6–15.2)

## 2012-07-01 LAB — BASIC METABOLIC PANEL
Calcium: 8.4 mg/dL (ref 8.4–10.5)
GFR calc non Af Amer: >90 mL/min (ref >90)
Glucose, Bld: 102 mg/dL — ABNORMAL HIGH (ref 70–99)
Sodium: 135 mEq/L (ref 135–145)

## 2012-07-01 MED ORDER — WARFARIN SODIUM 7.5 MG PO TABS
7.5000 mg | ORAL_TABLET | Freq: Once | ORAL | Status: AC
  Administered 2012-07-01: 7.5 mg via ORAL
  Filled 2012-07-01: qty 1

## 2012-07-01 NOTE — Progress Notes (Signed)
ANTICOAGULATION CONSULT NOTE - Follow Up Consult  Pharmacy Consult for warfarin Indication: VTE prophylaxis  No Known Allergies  Patient Measurements: Height: 5\' 5"  (165.1 cm) Weight: 188 lb (85.276 kg) IBW/kg (Calculated) : 57    Vital Signs: Temp: 97.8 F (36.6 C) (07/24 0209) Temp src: Oral (07/24 0209) BP: 95/56 mmHg (07/24 0921) Pulse Rate: 106  (07/24 0921)  Labs:  Basename 07/01/12 0623 06/30/12 1652 06/30/12 1010  HGB 11.0* -- 13.9  HCT 32.4* -- 40.3  PLT 187 -- 233  APTT -- -- --  LABPROT 15.7* 13.7 --  INR 1.22 1.03 --  HEPARINUNFRC -- -- --  CREATININE 0.51 -- 0.67  CKTOTAL -- -- --  CKMB -- -- --  TROPONINI -- -- --    Estimated Creatinine Clearance: 83.7 ml/min (by C-G formula based on Cr of 0.51).   Medications:  Prescriptions prior to admission  Medication Sig Dispense Refill  . gabapentin (NEURONTIN) 300 MG capsule Take 300 mg by mouth at bedtime.      . valsartan-hydrochlorothiazide (DIOVAN-HCT) 160-25 MG per tablet Take 1 tablet by mouth daily before breakfast.       . DISCONTD: HYDROcodone-acetaminophen (NORCO/VICODIN) 5-325 MG per tablet Take 1 tablet by mouth every 6 (six) hours as needed. Q4-6 hours PRN      . DISCONTD: naproxen sodium (ANAPROX) 220 MG tablet Take 440 mg by mouth 2 (two) times daily with a meal.        Assessment: 56 y/o female s/p total hip arthroplasty on 7/23.  On warfarin for VTE prophylaxis, bridging with Lovenox.  Baseline INR 1.03. Coumadin score = 4.  INR 1.22 today after one 7.5 mg dose last night.    Goal of Therapy:  INR 2-3 Monitor platelets by anticoagulation protocol: Yes   Plan:  1) Coumadin 7.5mg  po x1 tonight. 2) Daily PT/INR 3) Discontinue Lovenox when INR >/= 1.8    Doris Cheadle, PharmD Clinical Pharmacist Pager: [phone] Phone: [phone] 07/01/2012 9:49 AM

## 2012-07-01 NOTE — Progress Notes (Signed)
Physical Therapy Evaluation Patient Details Name: Kathy Barr MRN: [id number] DOB: 1956-11-07 Today's Date: 07/01/2012 Time: 4098-1191 PT Time Calculation (min): 31 min  PT Assessment / Plan / Recommendation Clinical Impression  56 yo female s/p L THA presents with decr functional mobility; Will benefit from PT to maximize independence and safety with mobility, transfers, amb, and educate pt in Posterior Hip Prec to enable safe dc home    PT Assessment  Patient needs continued PT services    Follow Up Recommendations  Home health PT;Supervision/Assistance - 24 hour    Barriers to Discharge None      Equipment Recommendations  3 in 1 bedside comode (Pt may already have bedside commode)    Recommendations for Other Services     Frequency 7X/week    Precautions / Restrictions Precautions Precautions: po Restrictions Weight Bearing Restrictions: Yes LLE Weight Bearing: Weight bearing as tolerated   Pertinent Vitals/Pain 4/10 Left LE with amb      Mobility  Bed Mobility Bed Mobility:  (Pt presented to PT on [facility]) Details for Bed Mobility Assistance: Will plan to assess bed mobility next session Transfers Transfers: Sit to Stand;Stand to Sit Sit to Stand: 4: Min assist;With armrests;From chair/3-in-1 Stand to Sit: 4: Min assist;To chair/3-in-1;With armrests Details for Transfer Assistance: Cues for precautions, LLE positioning, hand placement Ambulation/Gait Ambulation/Gait Assistance: 4: Min assist Ambulation Distance (Feet): 50 Feet Assistive device: Rolling walker Ambulation/Gait Assistance Details: Cues for gait sequence, and precautions, especially to keep from internally rrotating with turns Gait Pattern: Step-to pattern  Fatigued with ambulation  Exercises     PT Diagnosis: Difficulty walking;Acute pain  PT Problem List: Decreased strength;Decreased range of motion;Decreased activity tolerance;Decreased mobility;Decreased knowledge of use of DME;Decreased  knowledge of precautions;Pain PT Treatment Interventions: DME instruction;Gait training;Stair training;Functional mobility training;Therapeutic activities;Therapeutic exercise;Patient/family education   PT Goals Acute Rehab PT Goals PT Goal Formulation: With patient Time For Goal Achievement: 07/01/12 Potential to Achieve Goals: Good Pt will go Supine/Side to Sit: with supervision PT Goal: Supine/Side to Sit - Progress: Goal set today Pt will go Sit to Supine/Side: with supervision PT Goal: Sit to Supine/Side - Progress: Goal set today Pt will go Sit to Stand: with supervision PT Goal: Sit to Stand - Progress: Goal set today Pt will go Stand to Sit: with supervision PT Goal: Stand to Sit - Progress: Goal set today Pt will Ambulate: >150 feet;with supervision;with rolling walker PT Goal: Ambulate - Progress: Goal set today Pt will Go Up / Down Stairs: 6-9 stairs;with min assist;with rail(s) PT Goal: Up/Down Stairs - Progress: Goal set today Pt will Perform Home Exercise Program: with supervision, verbal cues required/provided PT Goal: Perform Home Exercise Program - Progress: Goal set today  Visit Information  Last PT Received On: 07/01/12 Assistance Needed: +1    Subjective Data  Subjective: Agreeable to amb Patient Stated Goal: decr pain   Prior Functioning  Home Living Lives With: Significant other Available Help at Discharge: Family;Available 24 hours/day Type of Home: House Home Access: Stairs to enter;Level entry Entrance Stairs-Number of Steps: 6 Entrance Stairs-Rails: Right;Left Home Layout: One level Bathroom Shower/Tub: Tub/shower unit Home Adaptive Equipment: Walker - rolling;Quad cane Prior Function Level of Independence: Independent with assistive device(s) Able to Take Stairs?: Yes Communication Communication: No difficulties    Cognition  Overall Cognitive Status: Appears within functional limits for tasks assessed/performed Arousal/Alertness:  Awake/alert Orientation Level: Appears intact for tasks assessed Behavior During Session: [facility] for tasks performed    Extremity/Trunk Assessment Right  Upper Extremity Assessment RUE ROM/Strength/Tone: Within functional levels Left Upper Extremity Assessment LUE ROM/Strength/Tone: Within functional levels Right Lower Extremity Assessment RLE ROM/Strength/Tone: Within functional levels Left Lower Extremity Assessment LLE ROM/Strength/Tone: Deficits;Due to precautions LLE ROM/Strength/Tone Deficits: Decr AROM and strength, limitted by pain postop Trunk Assessment Trunk Assessment: Normal   Balance    End of Session PT - End of Session Equipment Utilized During Treatment: Gait belt Activity Tolerance: Patient limited by fatigue Patient left: in chair;with call bell/phone within reach;with family/visitor present Nurse Communication: Mobility status  GP     Olen Pel [place], [place] [phone]  07/01/2012, 10:22 AM

## 2012-07-01 NOTE — Progress Notes (Signed)
UR COMPLETED  

## 2012-07-01 NOTE — Evaluation (Signed)
Occupational Therapy Evaluation Patient Details Name: Kathy Barr MRN: [id number] DOB: 30-Jan-1956 Today's Date: 07/01/2012 Time: 1191-4782 OT Time Calculation (min): 20 min  OT Assessment / Plan / Recommendation Clinical Impression  Pt doing well POD 1 LTHR. Next session to focus on tub transfers and AE ed. Skilled OT indicated to maximize I w/BADLs to supervision level in prep for d/c home with prn A from family.    OT Assessment  Patient needs continued OT Services    Follow Up Recommendations  No OT follow up    Barriers to Discharge      Equipment Recommendations  3 in 1 bedside comode    Recommendations for Other Services    Frequency  Min 2X/week    Precautions / Restrictions Precautions Precautions: Posterior Hip Precaution Booklet Issued: Yes (comment) Precaution Comments: Able to recall 2/3 hip precautions. Restrictions Weight Bearing Restrictions: No LLE Weight Bearing: Weight bearing as tolerated   Pertinent Vitals/Pain     ADL  Grooming: Performed;Wash/dry hands;Min guard Where Assessed - Grooming: Supported standing Lower Body Bathing: Simulated;Minimal assistance Where Assessed - Lower Body Bathing: Supported sit to stand Lower Body Dressing: Simulated;Moderate assistance Where Assessed - Lower Body Dressing: Supported sit to stand Toilet Transfer: Performed;Min Pension scheme manager Method: Sit to Barista: Raised toilet seat with arms (or 3-in-1 over toilet) Toileting - Clothing Manipulation and Hygiene: Performed;Min guard Where Assessed - Engineer, mining and Hygiene: Sit to stand from 3-in-1 or toilet Equipment Used: Rolling walker Transfers/Ambulation Related to ADLs: Pt ambulated to bathroom with minguard A.    OT Diagnosis: Generalized weakness  OT Problem List: Decreased activity tolerance;Decreased safety awareness;Decreased knowledge of use of DME or AE;Decreased knowledge of precautions OT Treatment  Interventions: Self-care/ADL training;Therapeutic activities;DME and/or AE instruction;Patient/family education   OT Goals Acute Rehab OT Goals OT Goal Formulation: With patient Time For Goal Achievement: 07/08/12 Potential to Achieve Goals: Good ADL Goals Pt Will Perform Grooming: with supervision;Standing at sink ADL Goal: Grooming - Progress: Goal set today Pt Will Perform Lower Body Bathing: with supervision;Sit to stand from chair;Sit to stand from bed;with adaptive equipment ADL Goal: Lower Body Bathing - Progress: Goal set today Pt Will Perform Lower Body Dressing: with supervision;Sit to stand from bed;Sit to stand from chair;with adaptive equipment ADL Goal: Lower Body Dressing - Progress: Goal set today Pt Will Transfer to Toilet: Maintaining hip precautions;3-in-1;Comfort height toilet;Ambulation;with supervision ADL Goal: Toilet Transfer - Progress: Goal set today Pt Will Perform Toileting - Clothing Manipulation: with supervision;Standing ADL Goal: Toileting - Clothing Manipulation - Progress: Goal set today Pt Will Perform Toileting - Hygiene: with supervision;Sit to stand from 3-in-1/toilet ADL Goal: Toileting - Hygiene - Progress: Goal set today Pt Will Perform Tub/Shower Transfer: Tub transfer;with min assist;Shower seat with back;Transfer tub bench;Ambulation ADL Goal: Tub/Shower Transfer - Progress: Goal set today  Visit Information  Last OT Received On: 07/01/12 Assistance Needed: +1    Subjective Data  Subjective: I guess I can get up again. Patient Stated Goal: Not asked   Prior Functioning  Vision/Perception  Home Living Lives With: Significant other Available Help at Discharge: Family;Available 24 hours/day Type of Home: House Home Access: Stairs to enter [employer] of Steps: 6 Entrance Stairs-Rails: Right;Left;Can reach both Home Layout: One level Bathroom Shower/Tub: Engineer, manufacturing systems: Standard Bathroom Accessibility:  Yes How Accessible: Accessible via walker Home Adaptive Equipment: Quad cane;Walker - rolling;Shower chair with back;Grab bars around toilet Prior Function Level of Independence: Independent with assistive  device(s) Able to Take Stairs?: Yes Driving: Yes Vocation: On disability Communication Communication: No difficulties Dominant Hand: Right      Cognition  Overall Cognitive Status: Appears within functional limits for tasks assessed/performed Arousal/Alertness: Awake/alert Orientation Level: Appears intact for tasks assessed Behavior During Session: [facility] for tasks performed Cognition - Other Comments: Consistently able to recall 2/3 back prec; Needs visual cues for third (which is no hip IR)    Extremity/Trunk Assessment Right Upper Extremity Assessment RUE ROM/Strength/Tone: [facility] for tasks assessed Left Upper Extremity Assessment LUE ROM/Strength/Tone: WFL for tasks assessed   Mobility Bed Mobility Bed Mobility: Sit to Supine Supine to Sit: 4: Min guard;HOB elevated;With rails Sitting - Scoot to Edge of Bed: 5: Supervision Sit to Supine: 4: Min guard;With rail;HOB elevated Details for Bed Mobility Assistance: Min VCs for technique and precautions Transfers Sit to Stand: 4: Min guard;With upper extremity assist;From bed;From chair/3-in-1 Stand to Sit: 4: Min guard;To chair/3-in-1;With armrests;To bed Details for Transfer Assistance: min VCs for hand placement and LLE management.   Exercise    Balance    End of Session OT - End of Session Activity Tolerance: Patient tolerated treatment well Patient left: in bed;with call bell/phone within reach;with family/visitor present  GO     Delle Andrzejewski A OTR/L [phone] 07/01/2012, 3:43 PM

## 2012-07-01 NOTE — Progress Notes (Signed)
CARE MANAGEMENT NOTE 07/01/2012  Patient:  Kathy Barr, Kathy Barr   Account Number:  [id number]  Date Initiated:  07/01/2012  Documentation initiated by:  Vance Peper  Subjective/Objective Assessment:   56 yr old female s/p left total hip arthroplasty.     Action/Plan:   CM spoke with patient regarding home health and DME needs. Choice offered. Patient states she has rolling walker, three in one and shower stool. Will have assistance at discharge.   Anticipated DC Date:  07/03/2012   Anticipated DC Plan:  HOME W HOME HEALTH SERVICES      DC Planning Services  CM consult      [facility] Choice  HOME HEALTH   Choice offered to / List presented to:  C-1 Patient        HH arranged  HH-2 PT      [facility] agency  [facility]   Status of service:  Completed, signed off Medicare Important Message given?   (If response is "NO", the following Medicare IM given date fields will be blank) Date Medicare IM given:   Date Additional Medicare IM given:    Discharge Disposition:  HOME W HOME HEALTH SERVICES

## 2012-07-01 NOTE — Progress Notes (Signed)
Subjective: 1 Day Post-Op Procedure(s) (LRB): TOTAL HIP ARTHROPLASTY (Left) Patient reports pain as mild.   C/O of right leg and foot pain which is related to her spine.  She has been evaluated for her foot pain and although she was told by someone it was fractured, per DR Otelia Sergeant our evaluation does not indicate acute fracture of her right  Foot. Reports no nausea  Objective: Vital signs in last 24 hours: Temp:  [97 F (36.1 C)-98.3 F (36.8 C)] 97.8 F (36.6 C) (07/24 0209) Pulse Rate:  [78-99] 85  (07/24 0209) Resp:  [11-24] 18  (07/24 0450) BP: (99-142)/(57-107) 99/57 mmHg (07/24 0209) SpO2:  [93 %-100 %] 94 % (07/24 0450) FiO2 (%):  [100 %] 100 % (07/23 1643) Weight:  [85.276 kg (188 lb)] 85.276 kg (188 lb) (07/23 1051)  Intake/Output from previous day: 07/23 0701 - 07/24 0700 In: 2121.7 [P.O.:420; I.V.:1701.7] Out: 1150 [Urine:750; Drains:125; Blood:275] Intake/Output this shift:     Basename 07/01/12 0623 06/30/12 1010  HGB 11.0* 13.9    Basename 07/01/12 0623 06/30/12 1010  WBC 9.0 8.5  RBC 3.92 4.92  HCT 32.4* 40.3  PLT 187 233    Basename 07/01/12 0623 06/30/12 1010  NA 135 142  K 3.3* 3.4*  CL 100 102  CO2 24 29  BUN 6 8  CREATININE 0.51 0.67  GLUCOSE 102* 109*  CALCIUM 8.4 9.8    Basename 07/01/12 0623 06/30/12 1652  LABPT -- --  INR 1.22 1.03    Neurovascular intact Sensation intact distally Intact pulses distally Dorsiflexion/Plantar flexion intact Incision: dressing C/D/I Drain removed without difficulty Assessment/Plan: 1 Day Post-Op Procedure(s) (LRB): TOTAL HIP ARTHROPLASTY (Left) Up with therapy Discharge home with home health and usual DME for THR when stable  Areta Terwilliger M 07/01/2012, 8:18 AM

## 2012-07-01 NOTE — Progress Notes (Signed)
Referral received for SNF. Chart reviewed and CSW has spoken with RNCM who indicates that patient is for DC to home with Home Health and DME.  CSW to sign off. Please re-consult if CSW needs arise.  Alexanderia Gorby T. Mauriana Dann, LCSWA  [phone]  

## 2012-07-01 NOTE — Progress Notes (Signed)
Physical Therapy Treatment Patient Details Name: Kathy Barr MRN: [id number] DOB: February 16, 1956 Today's Date: 07/01/2012 Time: 1130-1200 PT Time Calculation (min): 30 min  PT Assessment / Plan / Recommendation Comments on Treatment Session  Steady improvements; continues to need reinforcement of Post Hip Prec    Follow Up Recommendations  Home health PT;Supervision/Assistance - 24 hour    Barriers to Discharge        Equipment Recommendations  None recommended by PT    Recommendations for Other Services    Frequency 7X/week   Plan Discharge plan remains appropriate    Precautions / Restrictions Precautions Precautions: Posterior Hip Precaution Booklet Issued: Yes (comment) Restrictions LLE Weight Bearing: Weight bearing as tolerated   Pertinent Vitals/Pain 5/10 pain Left hip    Mobility  Bed Mobility Bed Mobility: Supine to Sit;Sitting - Scoot to Edge of Bed Supine to Sit: 4: Min guard (without physical contact) Sitting - Scoot to Edge of Bed: 4: Min guard (without physical contact) Details for Bed Mobility Assistance: Cues fro technique and precautions; did not require physical assist, but close monitoring for Left hip rotation; Bed adjusted to approximate home Transfers Transfers: Sit to Stand;Stand to Sit Sit to Stand: 4: Min guard;From bed;With upper extremity assist Stand to Sit: 4: Min guard;To chair/3-in-1 Details for Transfer Assistance: Cues for precautions, LLE positioning, hand placement Ambulation/Gait Ambulation/Gait Assistance: 4: Min guard Ambulation Distance (Feet): 100 Feet Assistive device: Rolling walker Ambulation/Gait Assistance Details: continued need for cues to avoid hip rotation with turns; Boyfriend present and able to give appropriate cues as well; Much better activity tol this afternoon Gait Pattern: Step-to pattern    Exercises  Pt politely declining therex as she had an eminent need for bathroom, and then chose to eat lunch   PT  Diagnosis:    PT Problem List:   PT Treatment Interventions:     PT Goals Acute Rehab PT Goals Time For Goal Achievement: 07/08/12 Potential to Achieve Goals: Good Pt will go Supine/Side to Sit: with supervision PT Goal: Supine/Side to Sit - Progress: Progressing toward goal Pt will go Sit to Stand: with supervision PT Goal: Sit to Stand - Progress: Progressing toward goal Pt will go Stand to Sit: with supervision PT Goal: Stand to Sit - Progress: Progressing toward goal Pt will Ambulate: >150 feet;with supervision;with rolling walker PT Goal: Ambulate - Progress: Progressing toward goal Pt will Go Up / Down Stairs:  (Pt also has a level entry option, but farther to walk)  Visit Information  Last PT Received On: 07/01/12 Assistance Needed: +1    Subjective Data  Subjective: Eminient need to go to bathroom Patient Stated Goal: decr pain   Cognition  Overall Cognitive Status: Appears within functional limits for tasks assessed/performed Arousal/Alertness: Awake/alert Orientation Level: Appears intact for tasks assessed Behavior During Session: [facility] for tasks performed Cognition - Other Comments: Consistently able to recall 2/3 back prec; Needs visual cues for third (which is no hip IR)    Balance     End of Session PT - End of Session Equipment Utilized During Treatment: Gait belt Activity Tolerance: Patient tolerated treatment well Patient left: in chair;with call bell/phone within reach;with family/visitor present (setup for lunch) Nurse Communication: Mobility status   GP     Olen Pel Romeo, [place] [phone]  07/01/2012, 2:52 PM

## 2012-07-02 ENCOUNTER — Encounter (HOSPITAL_COMMUNITY): Payer: Self-pay | Admitting: General Practice

## 2012-07-02 LAB — PROTIME-INR
INR: 1.64 — ABNORMAL HIGH (ref 0.00–1.49)
Prothrombin Time: 19.7 seconds — ABNORMAL HIGH (ref 11.6–15.2)

## 2012-07-02 LAB — CBC
HCT: 29.3 % — ABNORMAL LOW (ref 36.0–46.0)
MCH: 27.6 pg (ref 26.0–34.0)
MCHC: 33.8 g/dL (ref 30.0–36.0)
RDW: 13.5 % (ref 11.5–15.5)

## 2012-07-02 LAB — URINE CULTURE

## 2012-07-02 MED ORDER — FERROUS SULFATE 325 (65 FE) MG PO TABS
325.0000 mg | ORAL_TABLET | Freq: Every day | ORAL | Status: DC
  Administered 2012-07-02 – 2012-07-03 (×2): 325 mg via ORAL
  Filled 2012-07-02 (×3): qty 1

## 2012-07-02 MED ORDER — WARFARIN SODIUM 5 MG PO TABS
5.0000 mg | ORAL_TABLET | Freq: Once | ORAL | Status: AC
  Administered 2012-07-02: 5 mg via ORAL
  Filled 2012-07-02: qty 1

## 2012-07-02 NOTE — Progress Notes (Signed)
Physical Therapy Treatment Patient Details Name: Kathy Barr MRN: [id number] DOB: 18-Dec-1955 Today's Date: 07/02/2012 Time: 1027-2536 PT Time Calculation (min): 14 min  PT Assessment / Plan / Recommendation Comments on Treatment Session  Pt continues to improve with mobility.  Pt should be appropriate to d/c home tomorrow barring set back.     Follow Up Recommendations  Home health PT;Supervision/Assistance - 24 hour    Barriers to Discharge        Equipment Recommendations  3 in 1 bedside comode    Recommendations for Other Services    Frequency 7X/week   Plan Discharge plan remains appropriate;Frequency remains appropriate    Precautions / Restrictions Precautions Precautions: Posterior Hip Precaution Booklet Issued: Yes (comment) Precaution Comments: Pt able to verbalize and demonstrate 3/3 posterior hip precautions.  Restrictions Weight Bearing Restrictions: Yes LLE Weight Bearing: Weight bearing as tolerated   Pertinent Vitals/Pain No c/o pain    Mobility  Bed Mobility Bed Mobility: Supine to Sit Supine to Sit: 5: Supervision Sitting - Scoot to Edge of Bed: 5: Supervision Sit to Supine: Not Tested (comment) Transfers Transfers: Sit to Stand;Stand to Sit Sit to Stand: 5: Supervision;From bed;From chair/3-in-1;With upper extremity assist Stand to Sit: 5: Supervision;With upper extremity assist;To chair/3-in-1;With armrests Details for Transfer Assistance: Min Verbal cueing for hand placement and to position LLE to maintain hip precautions. Ambulation/Gait Ambulation/Gait Assistance: 5: Supervision Ambulation Distance (Feet): 150 Feet (2 trials of 150 each. ) Assistive device: Rolling walker Ambulation/Gait Assistance Details: Instructed pt in reciprocal gait pattern and sequencing with RW.  Pt tolerated well.     Gait Pattern: Step-through pattern;Decreased step length - right;Decreased stance time - left Gait velocity: WFL General Gait Details: Pt's L LE step  length too long and RLE step length too short.   Stairs: No Wheelchair Mobility Wheelchair Mobility: No    Exercises Total Joint Exercises Ankle Circles/Pumps: 10 reps;AROM;Both;Supine   PT Diagnosis:    PT Problem List:   PT Treatment Interventions:     PT Goals Acute Rehab PT Goals PT Goal Formulation: With patient Time For Goal Achievement: 07/08/12 Potential to Achieve Goals: Good Pt will go Supine/Side to Sit: with supervision PT Goal: Supine/Side to Sit - Progress: Met Pt will go Sit to Supine/Side: with supervision Pt will go Sit to Stand: with supervision PT Goal: Sit to Stand - Progress: Met Pt will go Stand to Sit: with supervision PT Goal: Stand to Sit - Progress: Met Pt will Ambulate: >150 feet;with supervision;with rolling walker PT Goal: Ambulate - Progress: Progressing toward goal Pt will Go Up / Down Stairs: 6-9 stairs;with min assist;with rail(s) Pt will Perform Home Exercise Program: with supervision, verbal cues required/provided PT Goal: Perform Home Exercise Program - Progress: Progressing toward goal  Visit Information  Last PT Received On: 07/02/12 Assistance Needed: +1    Subjective Data  Subjective: I feel pretty good Patient Stated Goal: Walk normally.    Cognition  Overall Cognitive Status: Appears within functional limits for tasks assessed/performed Arousal/Alertness: Awake/alert Orientation Level: Appears intact for tasks assessed Behavior During Session: [facility] for tasks performed    Balance  Balance Balance Assessed: No  End of Session PT - End of Session Equipment Utilized During Treatment: Gait belt Activity Tolerance: Patient tolerated treatment well Patient left: in chair;with call bell/phone within reach;with family/visitor present Nurse Communication: Mobility status   GP     Kathy Barr 07/02/2012, 5:38 PM Kathy Barr L. Kathy Barr DPT [phone]

## 2012-07-02 NOTE — Anesthesia Postprocedure Evaluation (Signed)
  Anesthesia Post-op Note  Patient: Kathy Barr  Procedure(s) Performed: Procedure(s) (LRB): TOTAL HIP ARTHROPLASTY (Left)  Patient Location: Nursing Unit  Anesthesia Type: General  Level of Consciousness: awake, alert , oriented and patient cooperative  Airway and Oxygen Therapy: Patient Spontanous Breathing  Post-op Pain: mild  Post-op Assessment: Post-op Vital signs reviewed, Patient's Cardiovascular Status Stable, Respiratory Function Stable, Patent Airway, No signs of Nausea or vomiting, Adequate PO intake and Pain level controlled  Post-op Vital Signs: Reviewed and stable  Complications: No apparent anesthesia complications

## 2012-07-02 NOTE — Progress Notes (Addendum)
ANTICOAGULATION CONSULT NOTE - Follow Up Consult  Pharmacy Consult for warfarin Indication: VTE prophylaxis  No Known Allergies  Patient Measurements: Height: 5\' 5"  (165.1 cm) Weight: 188 lb (85.276 kg) IBW/kg (Calculated) : 57    Vital Signs: Temp: 98.8 F (37.1 C) (07/25 0556) Temp src: Oral (07/25 0556) BP: 111/60 mmHg (07/25 1031) Pulse Rate: 104  (07/25 1031)  Labs:  Basename 07/02/12 0627 07/01/12 0623 06/30/12 1652 06/30/12 1010  HGB 9.9* 11.0* -- --  HCT 29.3* 32.4* -- 40.3  PLT 192 187 -- 233  APTT -- -- -- --  LABPROT 19.7* 15.7* 13.7 --  INR 1.64* 1.22 1.03 --  HEPARINUNFRC -- -- -- --  CREATININE -- 0.51 -- 0.67  CKTOTAL -- -- -- --  CKMB -- -- -- --  TROPONINI -- -- -- --    Estimated Creatinine Clearance: 83.7 ml/min (by C-G formula based on Cr of 0.51).   Medications:  Prescriptions prior to admission  Medication Sig Dispense Refill  . gabapentin (NEURONTIN) 300 MG capsule Take 300 mg by mouth at bedtime.      . valsartan-hydrochlorothiazide (DIOVAN-HCT) 160-25 MG per tablet Take 1 tablet by mouth daily before breakfast.       . DISCONTD: HYDROcodone-acetaminophen (NORCO/VICODIN) 5-325 MG per tablet Take 1 tablet by mouth every 6 (six) hours as needed. Q4-6 hours PRN      . DISCONTD: naproxen sodium (ANAPROX) 220 MG tablet Take 440 mg by mouth 2 (two) times daily with a meal.        Assessment: 56 y/o female s/p total hip arthroplasty on 7/23.  On warfarin for VTE prophylaxis, bridging with Lovenox.  Baseline INR 1.03. Coumadin score = 4.  INR 1.64 today after second 7.5 mg dose last night. Hg has dropped from 11 to 9.9, ortho noted pt is asymptomatic. They are monitoring and started oral iron.    Goal of Therapy:  INR 2-3 Monitor platelets by anticoagulation protocol: Yes   Plan:  1) Coumadin 5mg  po x1 tonight. 2) Daily PT/INR 3) Discontinue Lovenox when INR >/= 1.8    Vania Rea. Darin Engels.D. Clinical Pharmacist Pager  [phone] Phone [phone] 07/02/2012 12:00 PM   Addendum completed by: Rolland Porter, Pharm.D., BCPS Clinical Pharmacist Pager: [phone] 07/02/2012 14:25 PM

## 2012-07-02 NOTE — Progress Notes (Signed)
Subjective: 2 Days Post-Op Procedure(s) (LRB): TOTAL HIP ARTHROPLASTY (Left) Patient reports pain as mild.   Walked with PT yesterday No nausea, +flatus and taking a regular diet Objective: Vital signs in last 24 hours: Temp:  [97.3 F (36.3 C)-99.5 F (37.5 C)] 98.8 F (37.1 C) (07/25 0556) Pulse Rate:  [102-111] 102  (07/25 0556) Resp:  [18] 18  (07/25 0800) BP: (95-112)/(49-68) 112/68 mmHg (07/25 0556) SpO2:  [91 %-95 %] 95 % (07/25 0800)  Intake/Output from previous day: 07/24 0701 - 07/25 0700 In: 720 [P.O.:720] Out: 600 [Urine:600] Intake/Output this shift: Total I/O In: 360 [P.O.:360] Out: -    Basename 07/02/12 0627 07/01/12 0623 06/30/12 1010  HGB 9.9* 11.0* 13.9    Basename 07/02/12 0627 07/01/12 0623  WBC 10.0 9.0  RBC 3.59* 3.92  HCT 29.3* 32.4*  PLT 192 187    Basename 07/01/12 0623 06/30/12 1010  NA 135 142  K 3.3* 3.4*  CL 100 102  CO2 24 29  BUN 6 8  CREATININE 0.51 0.67  GLUCOSE 102* 109*  CALCIUM 8.4 9.8    Basename 07/02/12 0627 07/01/12 0623  LABPT -- --  INR 1.64* 1.22    Neurovascular intact Sensation intact distally Intact pulses distally Dorsiflexion/Plantar flexion intact Incision: no drainage  Assessment/Plan: 2 Days Post-Op Procedure(s) (LRB): TOTAL HIP ARTHROPLASTY (Left) Up with therapy Discharge home with home health tomorrow Acute blood loss anemia:  Pt asymptomatic will start iron and monitor  Sidonia Nutter M 07/02/2012, 8:34 AM

## 2012-07-02 NOTE — Plan of Care (Signed)
Problem: Consults Goal: Diagnosis- Total Joint Replacement Primary Total Hip right     

## 2012-07-03 LAB — CBC
HCT: 28.1 % — ABNORMAL LOW (ref 36.0–46.0)
MCH: 27.5 pg (ref 26.0–34.0)
MCHC: 33.8 g/dL (ref 30.0–36.0)
RDW: 13.4 % (ref 11.5–15.5)

## 2012-07-03 LAB — PROTIME-INR: INR: 1.67 — ABNORMAL HIGH (ref 0.00–1.49)

## 2012-07-03 MED ORDER — METHOCARBAMOL 500 MG PO TABS: 500.0000 mg | ORAL_TABLET | Freq: Four times a day (QID) | ORAL | Status: AC | PRN

## 2012-07-03 MED ORDER — WARFARIN SODIUM 5 MG PO TABS: 5.0000 mg | ORAL_TABLET | Freq: Every day | ORAL | Status: DC

## 2012-07-03 MED ORDER — GABAPENTIN 300 MG PO CAPS: 300.0000 mg | ORAL_CAPSULE | Freq: Every day | ORAL | Status: DC

## 2012-07-03 MED ORDER — OXYCODONE HCL 5 MG PO TABS: 5.0000 mg | ORAL_TABLET | ORAL | Status: AC | PRN

## 2012-07-03 MED ORDER — HYDROCHLOROTHIAZIDE 25 MG PO TABS: 12.5000 mg | ORAL_TABLET | Freq: Every day | ORAL | Status: DC

## 2012-07-03 NOTE — Progress Notes (Signed)
Physical Therapy Treatment Patient Details Name: Kathy Barr MRN: [id number] DOB: Jul 12, 1956 Today's Date: 07/03/2012 Time: 4098-1191 PT Time Calculation (min): 21 min  PT Assessment / Plan / Recommendation Comments on Treatment Session  Pt has met all acute PT goals and is appropriate for d/c to home.    Follow Up Recommendations  Home health PT;Supervision/Assistance - 24 hour    Barriers to Discharge        Equipment Recommendations  3 in 1 bedside comode    Recommendations for Other Services    Frequency     Plan Discharge plan remains appropriate;All goals met and education completed, patient dischaged from PT services    Precautions / Restrictions Precautions Precautions: Posterior Hip Precaution Booklet Issued: Yes (comment) Precaution Comments: Pt able to verbalize and demonstrate 3/3 posterior hip precautions.  Restrictions Weight Bearing Restrictions: Yes LLE Weight Bearing: Weight bearing as tolerated   Pertinent Vitals/Pain No c/o pain    Mobility  Bed Mobility Bed Mobility: Not assessed Transfers Transfers: Sit to Stand;Stand to Sit Sit to Stand: 6: Modified independent (Device/Increase time);With upper extremity assist;From chair/3-in-1 Stand to Sit: 6: Modified independent (Device/Increase time);To chair/3-in-1;With upper extremity assist;With armrests Details for Transfer Assistance: Pt demonstrates safe technique.  Ambulation/Gait Ambulation/Gait Assistance: 6: Modified independent (Device/Increase time) Ambulation Distance (Feet): 200 Feet Assistive device: Rolling walker Ambulation/Gait Assistance Details: Pt demonstrate safety and modified independence.  Gait Pattern: Step-through pattern;Decreased step length - right;Decreased stance time - left Gait velocity: WFL Stairs: Yes Stairs Assistance: 6: Modified independent (Device/Increase time) Stair Management Technique: Forwards;With walker Number of Stairs: 1  Wheelchair Mobility Wheelchair  Mobility: No    Exercises Total Joint Exercises Ankle Circles/Pumps: 10 reps;AROM;Both;Supine Quad Sets: 5 reps;Both Gluteal Sets: 5 reps;Both   PT Diagnosis:    PT Problem List:   PT Treatment Interventions:     PT Goals Acute Rehab PT Goals PT Goal Formulation: With patient Time For Goal Achievement: 07/08/12 Potential to Achieve Goals: Good Pt will go Supine/Side to Sit: with supervision PT Goal: Supine/Side to Sit - Progress: Met Pt will go Sit to Supine/Side: with supervision PT Goal: Sit to Supine/Side - Progress: Met Pt will go Sit to Stand: with supervision PT Goal: Sit to Stand - Progress: Met Pt will go Stand to Sit: with supervision PT Goal: Stand to Sit - Progress: Met Pt will Ambulate: >150 feet;with supervision;with rolling walker PT Goal: Ambulate - Progress: Met Pt will Go Up / Down Stairs: 1-2 stairs;with min assist;with rolling walker PT Goal: Up/Down Stairs - Progress: Met Pt will Perform Home Exercise Program: with supervision, verbal cues required/provided PT Goal: Perform Home Exercise Program - Progress: Met  Visit Information  Last PT Received On: 07/03/12    Subjective Data  Subjective: I am ready to go Patient Stated Goal: Walk normally.    Cognition  Overall Cognitive Status: Appears within functional limits for tasks assessed/performed Arousal/Alertness: Awake/alert Orientation Level: Appears intact for tasks assessed Behavior During Session: [facility] for tasks performed    Balance  Balance Balance Assessed: No  End of Session PT - End of Session Equipment Utilized During Treatment: Gait belt Activity Tolerance: Patient tolerated treatment well Patient left: in chair;with call bell/phone within reach;with family/visitor present Nurse Communication: Mobility status   GP     Benedetto Ryder 07/03/2012, 5:47 PM Loye Vento L. Doran Nestle DPT [phone]

## 2012-07-03 NOTE — Progress Notes (Signed)
PT PROGRESS NOTE  07/02/12 1739  PT Visit Information  Last PT Received On 07/03/12  Assistance Needed +1  PT Time Calculation  PT Start Time 1730  PT Stop Time 1744  PT Time Calculation (min) 14 min  Precautions  Precautions Posterior Hip  Precaution Booklet Issued Yes (comment)  Precaution Comments Pt able to verbalize and demonstrate 3/3 posterior hip precautions.   Restrictions  Weight Bearing Restrictions Yes  Cognition  Overall Cognitive Status Appears within functional limits for tasks assessed/performed  Arousal/Alertness Awake/alert  Orientation Level Appears intact for tasks assessed  Behavior During Session [facility] for tasks performed  Bed Mobility  Bed Mobility Not assessed  Transfers  Transfers Not assessed  Ambulation/Gait  Ambulation/Gait Assistance Not tested (comment)  Total Joint Exercises  Ankle Circles/Pumps 10 reps;AROM;Both;Supine  Quad Sets Both;10 reps;Supine  Gluteal Sets 10 reps;Supine  Short Arc Quad 5 reps;Both;Supine  Heel Slides 10 reps;Supine  Hip ABduction/ADduction 5 reps;Both;Supine  PT - End of Session  Activity Tolerance Patient tolerated treatment well  Patient left in bed;with call bell/phone within reach  Nurse Communication Mobility status  PT - Assessment/Plan  Comments on Treatment Session Reviewed HEP with pt  PT Plan Discharge plan remains appropriate;Frequency remains appropriate  PT Frequency 7X/week  Follow Up Recommendations Home health PT;Supervision/Assistance - 24 hour  Equipment Recommended 3 in 1 bedside comode  Acute Rehab PT Goals  PT Goal Formulation With patient  Time For Goal Achievement 07/08/12  Potential to Achieve Goals Good  Pt will Perform Home Exercise Program with supervision, verbal cues required/provided  PT Goal: Perform Home Exercise Program - Progress Progressing toward goal  PT General Charges  $$ ACUTE PT VISIT 1 Procedure  PT Treatments  $Therapeutic Exercise 8-22 mins  Kathy Barr L. Iretha Kirley DPT  [phone]

## 2012-07-03 NOTE — Progress Notes (Signed)
Subjective: 3 Days Post-Op Procedure(s) (LRB): TOTAL HIP ARTHROPLASTY (Left) Patient reports pain as 2 on 0-10 scale and mild.    Objective: Vital signs in last 24 hours: Temp:  [97.7 F (36.5 C)-98.5 F (36.9 C)] 97.8 F (36.6 C) (07/26 0550) Pulse Rate:  [95-104] 95  (07/26 0550) Resp:  [16-18] 18  (07/26 0550) BP: (94-111)/(51-60) 97/58 mmHg (07/26 0550) SpO2:  [95 %-100 %] 97 % (07/26 0550)  Intake/Output from previous day: 07/25 0701 - 07/26 0700 In: 600 [P.O.:600] Out: -  Intake/Output this shift:     Basename 07/03/12 0525 07/02/12 0627 07/01/12 0623 06/30/12 1010  HGB 9.5* 9.9* 11.0* 13.9    Basename 07/03/12 0525 07/02/12 0627  WBC 6.5 10.0  RBC 3.45* 3.59*  HCT 28.1* 29.3*  PLT 172 192    Basename 07/01/12 0623 06/30/12 1010  NA 135 142  K 3.3* 3.4*  CL 100 102  CO2 24 29  BUN 6 8  CREATININE 0.51 0.67  GLUCOSE 102* 109*  CALCIUM 8.4 9.8    Basename 07/03/12 0525 07/02/12 0627  LABPT -- --  INR 1.67* 1.64*    Neurologically intact ABD soft Neurovascular intact Sensation intact distally Incision: dressing C/D/I  Assessment/Plan: 3 Days Post-Op Procedure(s) (LRB): TOTAL HIP ARTHROPLASTY (Left) Advance diet Up with therapy Discharge home with home health  Kathy Barr E 07/03/2012, 8:17 AM

## 2012-07-03 NOTE — Progress Notes (Signed)
PT Progress note:  Pt has met all acute PT goals and is appropriate for discharge to home today. Pt will benefit from a Rolling walker for safe ambulation at home.  Thank you for allowing me to be involved with the care of this delightful lady.    Justice Aguirre L. Neytiri Asche DPT [phone] 07/03/2012

## 2012-07-03 NOTE — Discharge Summary (Signed)
Physician Discharge Summary  Patient ID: Kathy Barr MRN: [id number] DOB/AGE: [birth year] 56 y.o.  Admit date: 06/30/2012 Discharge date: 07/03/2012  Admission Diagnoses:  Principal Problem:  *Osteoarthritis of left hip   Discharge Diagnoses:  Same  Past Medical History  Diagnosis Date  . Hypertension   . Shortness of breath   . Arthritis     L hip- osteoarthritis  . Fracture     ? rt lower leg per patient    Surgeries: Procedure(s): TOTAL HIP ARTHROPLASTY on 06/30/2012   Consultants:    Discharged Condition: Improved  Hospital Course: Kathy Barr is an 56 y.o. female who was admitted 06/30/2012 with a chief complaint of No chief complaint on file. , and found to have a diagnosis of Osteoarthritis of left hip.  They were brought to the operating room on 06/30/2012 and underwent the above named procedures.    They were given perioperative antibiotics:  Anti-infectives     Start     Dose/Rate Route Frequency Ordered Stop   06/30/12 1900   ceFAZolin (ANCEF) IVPB 1 g/50 mL premix        1 g 100 mL/hr over 30 Minutes Intravenous Every 6 hours 06/30/12 1642 07/01/12 0055   06/29/12 1420   ceFAZolin (ANCEF) IVPB 2 g/50 mL premix        2 g 100 mL/hr over 30 Minutes Intravenous 60 min pre-op 06/29/12 1420 06/30/12 1245        .  They were given sequential compression devices, early ambulation, and chemoprophylaxis for DVT prophylaxis.  They benefited maximally from their hospital stay and there were no complications.    Recent vital signs:  Filed Vitals:   07/03/12 0550  BP: 97/58  Pulse: 95  Temp: 97.8 F (36.6 C)  Resp: 18    Recent laboratory studies:  Results for orders placed during the hospital encounter of 06/30/12  TYPE AND SCREEN      Component Value Range   ABO/RH(D) O NEG     Antibody Screen NEG     Sample Expiration 07/03/2012    SURGICAL PCR SCREEN      Component Value Range   MRSA, PCR NEGATIVE  NEGATIVE   Staphylococcus aureus NEGATIVE   NEGATIVE  CBC      Component Value Range   WBC 8.5  4.0 - 10.5 K/uL   RBC 4.92  3.87 - 5.11 MIL/uL   Hemoglobin 13.9  12.0 - 15.0 g/dL   HCT 47.8  29.5 - 62.1 %   MCV 81.9  78.0 - 100.0 fL   MCH 28.3  26.0 - 34.0 pg   MCHC 34.5  30.0 - 36.0 g/dL   RDW 30.8  65.7 - 84.6 %   Platelets 233  150 - 400 K/uL  BASIC METABOLIC PANEL      Component Value Range   Sodium 142  135 - 145 mEq/L   Potassium 3.4 (*) 3.5 - 5.1 mEq/L   Chloride 102  96 - 112 mEq/L   CO2 29  19 - 32 mEq/L   Glucose, Bld 109 (*) 70 - 99 mg/dL   BUN 8  6 - 23 mg/dL   Creatinine, Ser 9.62  0.50 - 1.10 mg/dL   Calcium 9.8  8.4 - 95.2 mg/dL   GFR calc non Af Amer >90  >90 mL/min   GFR calc Af Amer >90  >90 mL/min  URINE CULTURE      Component Value Range   Specimen Description URINE, CATHETERIZED  Special Requests NONE     Culture  Setup Time 07/01/2012 01:07     Colony Count >=100,000 COLONIES/ML     Culture ESCHERICHIA COLI     Report Status 07/02/2012 FINAL     Organism ID, Bacteria ESCHERICHIA COLI    PROTIME-INR      Component Value Range   Prothrombin Time 13.7  11.6 - 15.2 seconds   INR 1.03  0.00 - 1.49  PROTIME-INR      Component Value Range   Prothrombin Time 15.7 (*) 11.6 - 15.2 seconds   INR 1.22  0.00 - 1.49  CBC      Component Value Range   WBC 9.0  4.0 - 10.5 K/uL   RBC 3.92  3.87 - 5.11 MIL/uL   Hemoglobin 11.0 (*) 12.0 - 15.0 g/dL   HCT 16.1 (*) 09.6 - 04.5 %   MCV 82.7  78.0 - 100.0 fL   MCH 28.1  26.0 - 34.0 pg   MCHC 34.0  30.0 - 36.0 g/dL   RDW 40.9  81.1 - 91.4 %   Platelets 187  150 - 400 K/uL  BASIC METABOLIC PANEL      Component Value Range   Sodium 135  135 - 145 mEq/L   Potassium 3.3 (*) 3.5 - 5.1 mEq/L   Chloride 100  96 - 112 mEq/L   CO2 24  19 - 32 mEq/L   Glucose, Bld 102 (*) 70 - 99 mg/dL   BUN 6  6 - 23 mg/dL   Creatinine, Ser 7.82  0.50 - 1.10 mg/dL   Calcium 8.4  8.4 - 95.6 mg/dL   GFR calc non Af Amer >90  >90 mL/min   GFR calc Af Amer >90  >90 mL/min    PROTIME-INR      Component Value Range   Prothrombin Time 19.7 (*) 11.6 - 15.2 seconds   INR 1.64 (*) 0.00 - 1.49  CBC      Component Value Range   WBC 10.0  4.0 - 10.5 K/uL   RBC 3.59 (*) 3.87 - 5.11 MIL/uL   Hemoglobin 9.9 (*) 12.0 - 15.0 g/dL   HCT 21.3 (*) 08.6 - 57.8 %   MCV 81.6  78.0 - 100.0 fL   MCH 27.6  26.0 - 34.0 pg   MCHC 33.8  30.0 - 36.0 g/dL   RDW 46.9  62.9 - 52.8 %   Platelets 192  150 - 400 K/uL  PROTIME-INR      Component Value Range   Prothrombin Time 20.0 (*) 11.6 - 15.2 seconds   INR 1.67 (*) 0.00 - 1.49  CBC      Component Value Range   WBC 6.5  4.0 - 10.5 K/uL   RBC 3.45 (*) 3.87 - 5.11 MIL/uL   Hemoglobin 9.5 (*) 12.0 - 15.0 g/dL   HCT 41.3 (*) 24.4 - 01.0 %   MCV 81.4  78.0 - 100.0 fL   MCH 27.5  26.0 - 34.0 pg   MCHC 33.8  30.0 - 36.0 g/dL   RDW 27.2  53.6 - 64.4 %   Platelets 172  150 - 400 K/uL    Discharge Medications:   Medication List  As of 07/03/2012  8:41 AM   TAKE these medications         gabapentin 300 MG capsule   Commonly known as: NEURONTIN   Take 1 capsule (300 mg total) by mouth at bedtime.      gabapentin 300 MG  capsule   Commonly known as: NEURONTIN   Take 300 mg by mouth at bedtime.      hydrochlorothiazide 25 MG tablet   Commonly known as: HYDRODIURIL   Take 0.5 tablets (12.5 mg total) by mouth daily.      methocarbamol 500 MG tablet   Commonly known as: ROBAXIN   Take 1 tablet (500 mg total) by mouth every 6 (six) hours as needed.      oxyCODONE 5 MG immediate release tablet   Commonly known as: Oxy IR/ROXICODONE   Take 1-2 tablets (5-10 mg total) by mouth every 3 (three) hours as needed for pain.      valsartan-hydrochlorothiazide 160-25 MG per tablet   Commonly known as: DIOVAN-HCT   Take 1 tablet by mouth daily before breakfast.      warfarin 5 MG tablet   Commonly known as: COUMADIN   Take 1 tablet (5 mg total) by mouth daily.         ASK your doctor about these medications          HYDROcodone-acetaminophen 5-325 MG per tablet   Commonly known as: NORCO/VICODIN   Take 1 tablet by mouth every 6 (six) hours as needed. Q4-6 hours PRN      naproxen sodium 220 MG tablet   Commonly known as: ANAPROX   Take 440 mg by mouth 2 (two) times daily with a meal.            Diagnostic Studies: Dg Pelvis Portable  06/30/2012  *RADIOLOGY REPORT*  Clinical Data: Postop.  PORTABLE PELVIS  Comparison: None  Findings: Patient has had left hip are plasty.  Surgical drain overlies the hip.  No evidence for new fracture dislocation. Degenerative changes are seen in the right hip.  IMPRESSION: Status post left hip arthroplasty.  No adverse features.  Original Report Authenticated By: Patterson Hammersmith, M.D.   Dg Hip Portable 1 View Left  06/30/2012  *RADIOLOGY REPORT*  Clinical Data: Status post left total hip arthroplasty.  PORTABLE LEFT HIP - 1 VIEW  Comparison: AP view of the pelvis on the same day.  Findings: The patient has undergone left total hip arthroplasty. The femoral head component is seated in the acetabular component. Surgical drain overlies the hip.  Soft tissue gas is present.  IMPRESSION: No evidence for dislocation following total hip arthroplasty.  Original Report Authenticated By: Patterson Hammersmith, M.D.    Disposition:   Discharge Orders    Future Orders Please Complete By Expires   Diet - low sodium heart healthy      Call MD / Call 911      Comments:   If you experience chest pain or shortness of breath, CALL 911 and be transported to the hospital emergency room.  If you develope a fever above 101 F, pus (white drainage) or increased drainage or redness at the wound, or calf pain, call your surgeon's office.   Constipation Prevention      Comments:   Drink plenty of fluids.  Prune juice may be helpful.  You may use a stool softener, such as Colace (over the counter) 100 mg twice a day.  Use MiraLax (over the counter) for constipation as needed.   Increase  activity slowly as tolerated      Discharge instructions      Comments:   Keep knee incision dry for 5 days post op then may wet while bathing. Therapy daily . Call if fever or chills or increased drainage. Go  to ER if acutely short of breath or call for ambulance. Return for follow up in 2 weeks. May full weight bear on the surgical leg unless told otherwise. In house walking for first 2 weeks.   Driving restrictions      Comments:   No driving for 2 weeks   Lifting restrictions      Comments:   No lifting for 6 weeks   Follow the hip precautions as taught in Physical Therapy      Change dressing      Comments:   You may change your dressing on 7/28, then change the dressing daily with sterile 4 x 4 inch gauze dressing and paper tape.  You may clean the incision with alcohol prior to redressing      Follow-up Information    Follow up with NITKA,JAMES E, MD in 2 weeks.   Contact information:   [employer] [address] [place] [address] [phone]           Signed: Kerrin Champagne 07/03/2012, 8:41 AM

## 2012-07-03 NOTE — Progress Notes (Signed)
Patient examined and lab reviewed with Vernon PA-C. 

## 2012-10-27 ENCOUNTER — Ambulatory Visit (INDEPENDENT_AMBULATORY_CARE_PROVIDER_SITE_OTHER): Payer: PRIVATE HEALTH INSURANCE | Admitting: Family Medicine

## 2012-10-27 ENCOUNTER — Encounter: Payer: Self-pay | Admitting: Family Medicine

## 2012-10-27 VITALS — BP 126/74 | HR 78 | Resp 18 | Ht 64.5 in | Wt 203.1 lb

## 2012-10-27 DIAGNOSIS — F17200 Nicotine dependence, unspecified, uncomplicated: Secondary | ICD-10-CM

## 2012-10-27 DIAGNOSIS — Z1239 Encounter for other screening for malignant neoplasm of breast: Secondary | ICD-10-CM

## 2012-10-27 DIAGNOSIS — M542 Cervicalgia: Secondary | ICD-10-CM

## 2012-10-27 DIAGNOSIS — M159 Polyosteoarthritis, unspecified: Secondary | ICD-10-CM

## 2012-10-27 DIAGNOSIS — E669 Obesity, unspecified: Secondary | ICD-10-CM

## 2012-10-27 DIAGNOSIS — Z13820 Encounter for screening for osteoporosis: Secondary | ICD-10-CM

## 2012-10-27 DIAGNOSIS — G8929 Other chronic pain: Secondary | ICD-10-CM

## 2012-10-27 DIAGNOSIS — I10 Essential (primary) hypertension: Secondary | ICD-10-CM

## 2012-10-27 DIAGNOSIS — Z720 Tobacco use: Secondary | ICD-10-CM

## 2012-10-27 LAB — CBC
HCT: 40.8 % (ref 36.0–46.0)
Hemoglobin: 13.9 g/dL (ref 12.0–15.0)
RBC: 5.28 MIL/uL — ABNORMAL HIGH (ref 3.87–5.11)
WBC: 8.3 10*3/uL (ref 4.0–10.5)

## 2012-10-27 LAB — LIPID PANEL
HDL: 59 mg/dL (ref >39)
LDL Cholesterol: 104 mg/dL — ABNORMAL HIGH (ref 0–99)
Total CHOL/HDL Ratio: 3.3 Ratio
Triglycerides: 169 mg/dL — ABNORMAL HIGH (ref <150)
VLDL: 34 mg/dL (ref 0–40)

## 2012-10-27 LAB — COMPREHENSIVE METABOLIC PANEL
AST: 18 U/L (ref 0–37)
BUN: 12 mg/dL (ref 6–23)
CO2: 29 mEq/L (ref 19–32)
Calcium: 9.6 mg/dL (ref 8.4–10.5)
Chloride: 102 mEq/L (ref 96–112)
Creat: 0.69 mg/dL (ref 0.50–1.10)
Glucose, Bld: 95 mg/dL (ref 70–99)

## 2012-10-27 MED ORDER — VALSARTAN-HYDROCHLOROTHIAZIDE 160-25 MG PO TABS: 1.0000 | ORAL_TABLET | Freq: Every day | ORAL | Status: DC

## 2012-10-27 NOTE — Patient Instructions (Signed)
Mammogram and Bone Density to be scheduled Continue blood pressure medication Get the labs done fasting- if normal you will receive a letter I will get your records F/U 4 months

## 2012-10-27 NOTE — Progress Notes (Signed)
  Subjective:    Patient ID: Kathy Barr, female    DOB: Feb 01, 1956, 56 y.o.   MRN: [id number]  HPI PT here to establish care. Previous PCP Dr. Polly Cobia Orthopedic surgeon Dr. Otelia Sergeant Medications and history reviewed Patient's history of hypertension she's been well-controlled on Diovan. She is history of chronic pain she's had both neck surgery for pinched nerve as well as left hip replacement. She's currently on pain medication and anti-inflammatories by orthopedic surgery. She has a remote injury to her right foot which also causes pain. She is overdue for mammogram and bone density. She's never had a colonoscopy She had her flu shot last week. She's been on disability for 3 years secondary to her arthritis  Review of Systems  GEN- denies fatigue, fever, weight loss,weakness, recent illness HEENT- denies eye drainage, change in vision, nasal discharge, CVS- denies chest pain, palpitations RESP- denies SOB, cough, wheeze ABD- denies N/V, change in stools, abd pain GU- denies dysuria, hematuria, dribbling, incontinence MSK- denies joint pain, muscle aches, injury Neuro- denies headache, dizziness, syncope, seizure activity      Objective:   Physical Exam GEN- NAD, alert and oriented x3, obese  HEENT- PERRL, EOMI, non injected sclera, pink conjunctiva, MMM, oropharynx clear Neck- Supple,  CVS- RRR, no murmur RESP-CTAB ABD-NABS,soft,NT,ND EXT- No edema Pulses- Radial, DP- 2+ Psych-normal affect and Mood       Assessment & Plan:

## 2012-10-29 DIAGNOSIS — E6609 Other obesity due to excess calories: Secondary | ICD-10-CM | POA: Insufficient documentation

## 2012-10-29 DIAGNOSIS — G8929 Other chronic pain: Secondary | ICD-10-CM | POA: Insufficient documentation

## 2012-10-29 DIAGNOSIS — I10 Essential (primary) hypertension: Secondary | ICD-10-CM | POA: Insufficient documentation

## 2012-10-29 DIAGNOSIS — M542 Cervicalgia: Secondary | ICD-10-CM | POA: Insufficient documentation

## 2012-10-29 DIAGNOSIS — E669 Obesity, unspecified: Secondary | ICD-10-CM | POA: Insufficient documentation

## 2012-10-29 DIAGNOSIS — M159 Polyosteoarthritis, unspecified: Secondary | ICD-10-CM | POA: Insufficient documentation

## 2012-10-29 NOTE — Assessment & Plan Note (Signed)
Chronic pain meds by ortho

## 2012-10-29 NOTE — Assessment & Plan Note (Signed)
Well controlled continue diovan

## 2012-10-29 NOTE — Assessment & Plan Note (Signed)
Counseled on cessation 

## 2012-10-29 NOTE — Assessment & Plan Note (Signed)
Chronic pain meds by ortho 

## 2012-10-30 ENCOUNTER — Ambulatory Visit (HOSPITAL_COMMUNITY): Payer: PRIVATE HEALTH INSURANCE

## 2012-11-03 ENCOUNTER — Ambulatory Visit (HOSPITAL_COMMUNITY)
Admission: RE | Admit: 2012-11-03 | Discharge: 2012-11-03 | Disposition: A | Discharge status: Home / Self Care | Payer: PRIVATE HEALTH INSURANCE | Source: Ambulatory Visit | Attending: Family Medicine | Admitting: Family Medicine

## 2012-11-03 DIAGNOSIS — Z720 Tobacco use: Secondary | ICD-10-CM

## 2012-11-03 DIAGNOSIS — Z13820 Encounter for screening for osteoporosis: Secondary | ICD-10-CM

## 2012-11-03 DIAGNOSIS — Z780 Asymptomatic menopausal state: Secondary | ICD-10-CM | POA: Insufficient documentation

## 2012-11-03 DIAGNOSIS — F17200 Nicotine dependence, unspecified, uncomplicated: Secondary | ICD-10-CM | POA: Insufficient documentation

## 2012-11-03 DIAGNOSIS — Z1239 Encounter for other screening for malignant neoplasm of breast: Secondary | ICD-10-CM

## 2012-11-03 DIAGNOSIS — Z1231 Encounter for screening mammogram for malignant neoplasm of breast: Secondary | ICD-10-CM | POA: Insufficient documentation

## 2012-12-28 ENCOUNTER — Ambulatory Visit (INDEPENDENT_AMBULATORY_CARE_PROVIDER_SITE_OTHER): Payer: Medicare Other | Admitting: Family Medicine

## 2012-12-28 ENCOUNTER — Encounter: Payer: Self-pay | Admitting: Family Medicine

## 2012-12-28 VITALS — BP 120/80 | HR 61 | Resp 16 | Ht 64.5 in | Wt 203.1 lb

## 2012-12-28 DIAGNOSIS — R21 Rash and other nonspecific skin eruption: Secondary | ICD-10-CM

## 2012-12-28 MED ORDER — PERMETHRIN 5 % EX CREA: TOPICAL_CREAM | CUTANEOUS | Status: DC

## 2012-12-28 NOTE — Patient Instructions (Signed)
Use the cream from neck down,  Try benadryl for itching,do not drive if it makes you sleepy Try oatmeal  Keep previous follow-appointment

## 2012-12-28 NOTE — Progress Notes (Signed)
  Subjective:    Patient ID: Kathy Barr, female    DOB: September 10, 1956, 57 y.o.   MRN: [id number]  HPI  Patient presents with rash for the past week she has bites all over that that it all over. She denies any recent change in detergent or soap she denies any contact with any plans to cause dermatitis. She denies any bed bugs her partner does not have a rash.   Review of Systems   GEN- denies fatigue, fever, weight loss,weakness, recent illness HEENT- denies eye drainage, change in vision, nasal discharge, CVS- denies chest pain, palpitations RESP- denies SOB, cough, wheeze Neuro- denies headache, dizziness, syncope, seizure activity      Objective:   Physical Exam Gen-NAD, alert and oriented x 3 Skin- multiple bites on legs, arms, back and abdomen, +excoriations, no erythema surrounding, no fluctuant lesions        Assessment & Plan:

## 2012-12-29 DIAGNOSIS — R21 Rash and other nonspecific skin eruption: Secondary | ICD-10-CM | POA: Insufficient documentation

## 2012-12-29 NOTE — Assessment & Plan Note (Signed)
She has multiple what appears to be bug bites all over her whole treat her for bed bugs/scabies with permethrin she's also given Benadryl for itch if this does not help will give her course of prednisone, no superinfected lesions

## 2013-02-19 ENCOUNTER — Encounter (HOSPITAL_COMMUNITY): Payer: Self-pay | Admitting: Pharmacy Technician

## 2013-02-24 ENCOUNTER — Other Ambulatory Visit (HOSPITAL_COMMUNITY): Payer: Self-pay | Admitting: Specialist

## 2013-02-25 ENCOUNTER — Ambulatory Visit: Payer: PRIVATE HEALTH INSURANCE | Admitting: Family Medicine

## 2013-03-01 ENCOUNTER — Ambulatory Visit (INDEPENDENT_AMBULATORY_CARE_PROVIDER_SITE_OTHER): Payer: Medicare Other | Admitting: Family Medicine

## 2013-03-01 ENCOUNTER — Encounter: Payer: Self-pay | Admitting: Family Medicine

## 2013-03-01 VITALS — BP 132/76 | HR 86 | Resp 18 | Ht 64.5 in | Wt 206.1 lb

## 2013-03-01 DIAGNOSIS — I10 Essential (primary) hypertension: Secondary | ICD-10-CM

## 2013-03-01 DIAGNOSIS — Z720 Tobacco use: Secondary | ICD-10-CM

## 2013-03-01 DIAGNOSIS — J209 Acute bronchitis, unspecified: Secondary | ICD-10-CM

## 2013-03-01 DIAGNOSIS — F17200 Nicotine dependence, unspecified, uncomplicated: Secondary | ICD-10-CM

## 2013-03-01 MED ORDER — PREDNISONE 10 MG PO TABS: ORAL_TABLET | ORAL | Status: DC

## 2013-03-01 MED ORDER — DOXYCYCLINE HYCLATE 100 MG PO TABS: 100.0000 mg | ORAL_TABLET | Freq: Two times a day (BID) | ORAL | Status: AC

## 2013-03-01 MED ORDER — ALBUTEROL SULFATE HFA 108 (90 BASE) MCG/ACT IN AERS: 2.0000 | INHALATION_SPRAY | Freq: Four times a day (QID) | RESPIRATORY_TRACT | Status: DC | PRN

## 2013-03-01 NOTE — Patient Instructions (Signed)
Treating for bronchitis Take the antibiotics, prednisone and inhaler as directed Continue blood pressure medication F/U 3 months

## 2013-03-01 NOTE — Progress Notes (Signed)
  Subjective:    Patient ID: Kathy Barr, female    DOB: September 18, 1956, 57 y.o.   MRN: [id number]  HPI  Patient here to follow chronic medical problems. She's scheduled to have right hip replacement first week of April. She complains today of cough with production and wheezing at bedtime and sore ribs for the past week at least. She continues to smoke. She denies any fever or shortness of breath. Or chest pain. She has been using Robitussin over-the-counter  Review of Systems   GEN- denies fatigue, fever, weight loss,weakness, recent illness HEENT- denies eye drainage, change in vision, nasal discharge, CVS- denies chest pain, palpitations RESP- denies SOB, +cough, +wheeze ABD- denies N/V, change in stools, abd pain GU- denies dysuria, hematuria, dribbling, incontinence MSK- + joint pain, muscle aches, injury Neuro- denies headache, dizziness, syncope, seizure activity      Objective:   Physical Exam GEN- NAD, alert and oriented x3 HEENT- PERRL, EOMI, non injected sclera, pink conjunctiva, MMM, oropharynx clear, TM clear bilat  Neck- Supple, no LAD  CVS- RRR, no murmur RESP-scattered bilateral wheeze, upper airway congestion, no rales, normal WOB EXT- No edema Pulses- Radial, DP- 2+        Assessment & Plan:

## 2013-03-02 DIAGNOSIS — J209 Acute bronchitis, unspecified: Secondary | ICD-10-CM | POA: Insufficient documentation

## 2013-03-02 NOTE — Assessment & Plan Note (Addendum)
Treat with antibiotics, steroids, albuterol inhaler  She is at smoke or concerns she has underlying COPD. She's not had a hip replacement done we will have PFT studies done afterwards she will have chest x-ray with her pre-op

## 2013-03-02 NOTE — Assessment & Plan Note (Signed)
Well controlled 

## 2013-03-02 NOTE — Assessment & Plan Note (Signed)
She is cutting back discussed importance of complete cessation which will aide in healing from her surgery as well as development of COPD

## 2013-03-04 ENCOUNTER — Encounter (HOSPITAL_COMMUNITY)
Admission: RE | Admit: 2013-03-04 | Discharge: 2013-03-04 | Disposition: A | Discharge status: Home / Self Care | Payer: Medicare Other | Source: Ambulatory Visit | Attending: Specialist | Admitting: Specialist

## 2013-03-04 NOTE — Pre-Procedure Instructions (Signed)
Kathy Barr  03/04/2013   Your procedure is scheduled on:  April 8  Report to [facility] at 05:30 AM.  Call this number if you have problems the morning of surgery: [phone]   Remember:   Do not eat food or drink liquids after midnight.   Take these medicines the morning of surgery with A SIP OF WATER: Albuterol Inhaler, Doxycycline (if still taking), Predinsone (if still taking)   Do not wear jewelry, make-up or nail polish.  Do not wear lotions, powders, or perfumes. You may wear deodorant.  Do not shave 48 hours prior to surgery. Men may shave face and neck.  Do not bring valuables to the hospital.  Contacts, dentures or bridgework may not be worn into surgery.  Leave suitcase in the car. After surgery it may be brought to your room.  For patients admitted to the hospital, checkout time is 11:00 AM the day of  discharge.   Special Instructions: Shower using CHG 2 nights before surgery and the night before surgery.  If you shower the day of surgery use CHG.  Use special wash - you have one bottle of CHG for all showers.  You should use approximately 1/3 of the bottle for each shower.   Please read over the following fact sheets that you were given: Pain Booklet, Coughing and Deep Breathing, Blood Transfusion Information and Surgical Site Infection Prevention

## 2013-03-09 ENCOUNTER — Other Ambulatory Visit: Payer: Self-pay

## 2013-03-09 MED ORDER — ALBUTEROL SULFATE HFA 108 (90 BASE) MCG/ACT IN AERS: 2.0000 | INHALATION_SPRAY | Freq: Four times a day (QID) | RESPIRATORY_TRACT | Status: DC | PRN

## 2013-03-11 ENCOUNTER — Encounter (HOSPITAL_COMMUNITY): Payer: Self-pay

## 2013-03-11 ENCOUNTER — Encounter (HOSPITAL_COMMUNITY)
Admission: RE | Admit: 2013-03-11 | Discharge: 2013-03-11 | Disposition: A | Discharge status: Home / Self Care | Payer: PRIVATE HEALTH INSURANCE | Source: Ambulatory Visit | Attending: Specialist | Admitting: Specialist

## 2013-03-11 HISTORY — DX: Gastro-esophageal reflux disease without esophagitis: K21.9

## 2013-03-11 LAB — CBC
HCT: 41.3 % (ref 36.0–46.0)
MCHC: 34.9 g/dL (ref 30.0–36.0)
Platelets: 223 10*3/uL (ref 150–400)
RDW: 13.8 % (ref 11.5–15.5)
WBC: 6.6 10*3/uL (ref 4.0–10.5)

## 2013-03-11 LAB — URINALYSIS, ROUTINE W REFLEX MICROSCOPIC
Bilirubin Urine: NEGATIVE
Glucose, UA: NEGATIVE mg/dL
Ketones, ur: NEGATIVE mg/dL
Leukocytes, UA: NEGATIVE
Nitrite: NEGATIVE
Specific Gravity, Urine: 1.01 (ref 1.005–1.030)
pH: 5.5 (ref 5.0–8.0)

## 2013-03-11 LAB — BASIC METABOLIC PANEL
BUN: 8 mg/dL (ref 6–23)
Calcium: 10 mg/dL (ref 8.4–10.5)
Creatinine, Ser: 0.69 mg/dL (ref 0.50–1.10)
GFR calc Af Amer: >90 mL/min (ref >90)
GFR calc non Af Amer: >90 mL/min (ref >90)

## 2013-03-11 LAB — APTT: aPTT: 36 seconds (ref 24–37)

## 2013-03-11 LAB — TYPE AND SCREEN: Antibody Screen: NEGATIVE

## 2013-03-11 LAB — SURGICAL PCR SCREEN
MRSA, PCR: NEGATIVE
Staphylococcus aureus: NEGATIVE

## 2013-03-11 NOTE — H&P (Signed)
TOTAL HIP ADMISSION H&P  Patient is admitted for right total hip arthroplasty.  Subjective:  Chief Complaint: right hip pain  HPI: Lebron Quam, 57 y.o. female, has a history of pain and functional disability in the right hip(s) due to arthritis and patient has failed non-surgical conservative treatments for greater than 12 weeks to include NSAID's and/or analgesics, use of assistive devices and activity modification.  Onset of symptoms was gradual starting 5 years ago with gradually worsening course since that time.The patient noted no past surgery on the right hip(s).  Patient currently rates pain in the right hip at 8 out of 10 with activity. Patient has night pain, worsening of pain with activity and weight bearing and pain that interfers with activities of daily living. Patient has evidence of subchondral sclerosis, periarticular osteophytes and joint space narrowing by imaging studies. This condition presents safety issues increasing the risk of falls. This patient has had left total hip arthroplasty and had excellent results..  There is no current active infection.  Patient Active Problem List   Diagnosis Date Noted  . Osteoarthritis of right hip 03/16/2013    Priority: High    Class: Chronic  . Acute bronchitis 03/02/2013  . Generalized OA 10/29/2012  . Essential hypertension, benign 10/29/2012  . Obesity, unspecified 10/29/2012  . Chronic neck pain 10/29/2012  . Tobacco user 10/29/2012   Past Medical History  Diagnosis Date  . Hypertension   . Arthritis     L hip- osteoarthritis  . Fracture     ? rt lower leg per patient  . Carpal tunnel syndrome   . Shortness of breath     better  . GERD (gastroesophageal reflux disease)     OTC    Past Surgical History  Procedure Laterality Date  . Cervical fusion    . Tubal ligation      1978  . Foot arthrotomy      R foot - pin, after removing a bone   . Abdominal hysterectomy    . Cholecystectomy      2003  . Total hip  arthroplasty  06/30/2012    Procedure: TOTAL HIP ARTHROPLASTY;  Surgeon: Kerrin Champagne, MD;  Location: [facility] OR;  Service: Orthopedics;  Laterality: Left;  Left total hip replacement with metal and polypropylene pore coated implants  . Hip arthroplasty  07/01/2012  . Joint replacement Left     hip    Prescriptions prior to admission  Medication Sig Dispense Refill  . cholecalciferol (VITAMIN D) 400 UNITS TABS Take 400 Units by mouth daily.      . predniSONE (DELTASONE) 10 MG tablet Take 40 x 5 days  20 tablet  0  . valsartan-hydrochlorothiazide (DIOVAN-HCT) 160-25 MG per tablet Take 1 tablet by mouth daily before breakfast.  90 tablet  1  . albuterol (PROAIR HFA) 108 (90 BASE) MCG/ACT inhaler Inhale 2 puffs into the lungs every 6 (six) hours as needed for wheezing.  6.7 g  3  . albuterol (PROVENTIL HFA;VENTOLIN HFA) 108 (90 BASE) MCG/ACT inhaler Inhale 2 puffs into the lungs every 6 (six) hours as needed for wheezing.  1 Inhaler  2   No Known Allergies  History  Substance Use Topics  . Smoking status: Current Every Day Smoker -- 0.25 packs/day for 25 years    Types: Cigarettes  . Smokeless tobacco: Never Used  . Alcohol Use: No    Family History  Problem Relation Age of Onset  . Hypertension Father   .  Diabetes Father   . Hypertension Sister   . Diabetes Sister   . Hypertension Brother   . Diabetes Brother      Review of Systems  Musculoskeletal: Positive for back pain.       Has degenerative spondylolisthesis L4-5 which have been treated in the past with ESIs. S/P THR left   All other systems reviewed and are negative.    Objective:  Physical Exam  Constitutional: She is oriented to person, place, and time. She appears well-developed and well-nourished.  HENT:  Head: Normocephalic and atraumatic.  Eyes: EOM are normal. Pupils are equal, round, and reactive to light.  Neck: Normal range of motion. Neck supple.  Cardiovascular: Normal rate, regular rhythm and normal heart  sounds.   Respiratory: Effort normal and breath sounds normal.  GI: Soft.  Musculoskeletal:  Painful and limited ROM of right hip.  Distal pulses and sensation intact.  No edema.  Well healed hip wound of left hip   Neurological: She is alert and oriented to person, place, and time.  Skin: Skin is warm and dry.  Psychiatric: She has a normal mood and affect.    Vital signs in last 24 hours: Temp:  [97.3 F (36.3 C)] 97.3 F (36.3 C) (04/08 0622) Pulse Rate:  [90] 90 (04/08 0622) Resp:  [18] 18 (04/08 0622) BP: (112)/(79) 112/79 mmHg (04/08 0622) SpO2:  [94 %] 94 % (04/08 0622)  Labs:   Estimated body mass index is 33.80 kg/(m^2) as calculated from the following:   Height as of 03/11/13: 5\' 5"  (1.651 m).   Weight as of 12/28/12: 92.135 kg (203 lb 1.9 oz).   Imaging Review Plain radiographs demonstrate moderate degenerative joint disease of the right hip(s). The bone quality appears to be adequate for age and reported activity level.  Assessment/Plan:  End stage arthritis, right hip(s)  The patient history, physical examination, clinical judgement of the provider and imaging studies are consistent with end stage degenerative joint disease of the right hip(s) and total hip arthroplasty is deemed medically necessary. The treatment options including medical management, injection therapy, arthroscopy and arthroplasty were discussed at length. The risks and benefits of total hip arthroplasty were presented and reviewed. The risks due to aseptic loosening, infection, stiffness, dislocation/subluxation,  thromboembolic complications and other imponderables were discussed.  The patient acknowledged the explanation, agreed to proceed with the plan and consent was signed. Patient is being admitted for inpatient treatment for surgery, pain control, PT, OT, prophylactic antibiotics, VTE prophylaxis, progressive ambulation and ADL's and discharge planning.The patient is planning to be discharged home  with home health services

## 2013-03-15 MED ORDER — CEFAZOLIN SODIUM-DEXTROSE 2-3 GM-% IV SOLR
2.0000 g | INTRAVENOUS | Status: AC
  Administered 2013-03-16: 2 g via INTRAVENOUS
  Filled 2013-03-15: qty 50

## 2013-03-16 ENCOUNTER — Encounter (HOSPITAL_COMMUNITY)
Admission: RE | Disposition: A | Discharge status: Home Health | Payer: Self-pay | Source: Ambulatory Visit | Attending: Specialist

## 2013-03-16 ENCOUNTER — Inpatient Hospital Stay (HOSPITAL_COMMUNITY): Payer: PRIVATE HEALTH INSURANCE

## 2013-03-16 ENCOUNTER — Inpatient Hospital Stay (HOSPITAL_COMMUNITY): Payer: PRIVATE HEALTH INSURANCE | Admitting: Certified Registered"

## 2013-03-16 ENCOUNTER — Encounter (HOSPITAL_COMMUNITY): Payer: Self-pay | Admitting: Specialist

## 2013-03-16 ENCOUNTER — Inpatient Hospital Stay (HOSPITAL_COMMUNITY)
Admission: RE | Admit: 2013-03-16 | Discharge: 2013-03-18 | DRG: 470 | Disposition: A | Discharge status: Home Health | Payer: PRIVATE HEALTH INSURANCE | Source: Ambulatory Visit | Attending: Specialist | Admitting: Specialist

## 2013-03-16 ENCOUNTER — Encounter (HOSPITAL_COMMUNITY): Payer: Self-pay | Admitting: Certified Registered"

## 2013-03-16 DIAGNOSIS — E669 Obesity, unspecified: Secondary | ICD-10-CM | POA: Diagnosis present

## 2013-03-16 DIAGNOSIS — Z981 Arthrodesis status: Secondary | ICD-10-CM

## 2013-03-16 DIAGNOSIS — Z01812 Encounter for preprocedural laboratory examination: Secondary | ICD-10-CM

## 2013-03-16 DIAGNOSIS — Z8249 Family history of ischemic heart disease and other diseases of the circulatory system: Secondary | ICD-10-CM

## 2013-03-16 DIAGNOSIS — M1611 Unilateral primary osteoarthritis, right hip: Secondary | ICD-10-CM | POA: Diagnosis present

## 2013-03-16 DIAGNOSIS — I10 Essential (primary) hypertension: Secondary | ICD-10-CM | POA: Diagnosis present

## 2013-03-16 DIAGNOSIS — Z6833 Body mass index (BMI) 33.0-33.9, adult: Secondary | ICD-10-CM

## 2013-03-16 DIAGNOSIS — Z833 Family history of diabetes mellitus: Secondary | ICD-10-CM

## 2013-03-16 DIAGNOSIS — G8929 Other chronic pain: Secondary | ICD-10-CM | POA: Diagnosis present

## 2013-03-16 DIAGNOSIS — F17200 Nicotine dependence, unspecified, uncomplicated: Secondary | ICD-10-CM | POA: Diagnosis present

## 2013-03-16 DIAGNOSIS — M169 Osteoarthritis of hip, unspecified: Principal | ICD-10-CM | POA: Diagnosis present

## 2013-03-16 DIAGNOSIS — Z23 Encounter for immunization: Secondary | ICD-10-CM

## 2013-03-16 DIAGNOSIS — M4310 Spondylolisthesis, site unspecified: Secondary | ICD-10-CM | POA: Diagnosis present

## 2013-03-16 DIAGNOSIS — K219 Gastro-esophageal reflux disease without esophagitis: Secondary | ICD-10-CM | POA: Diagnosis present

## 2013-03-16 DIAGNOSIS — M1610 Unilateral primary osteoarthritis, unspecified hip: Principal | ICD-10-CM | POA: Diagnosis present

## 2013-03-16 DIAGNOSIS — Z96649 Presence of unspecified artificial hip joint: Secondary | ICD-10-CM

## 2013-03-16 HISTORY — DX: Unilateral primary osteoarthritis, left hip: M16.12

## 2013-03-16 HISTORY — PX: TOTAL HIP ARTHROPLASTY: SHX124

## 2013-03-16 HISTORY — DX: Unspecified convulsions: R56.9

## 2013-03-16 SURGERY — ARTHROPLASTY, HIP, TOTAL,POSTERIOR APPROACH
Anesthesia: General | Site: Hip | Laterality: Right | Wound class: Clean

## 2013-03-16 MED ORDER — MIDAZOLAM HCL 5 MG/5ML IJ SOLN
INTRAMUSCULAR | Status: DC | PRN
  Administered 2013-03-16: 2 mg via INTRAVENOUS

## 2013-03-16 MED ORDER — PREDNISONE 20 MG PO TABS
40.0000 mg | ORAL_TABLET | Freq: Every day | ORAL | Status: AC
  Administered 2013-03-17 – 2013-03-18 (×2): 40 mg via ORAL
  Filled 2013-03-16 (×2): qty 2

## 2013-03-16 MED ORDER — OXYCODONE HCL 5 MG PO TABS: 5.0000 mg | ORAL_TABLET | Freq: Once | ORAL | Status: DC | PRN

## 2013-03-16 MED ORDER — MENTHOL 3 MG MT LOZG: 1.0000 | LOZENGE | OROMUCOSAL | Status: DC | PRN

## 2013-03-16 MED ORDER — ROCURONIUM BROMIDE 100 MG/10ML IV SOLN
INTRAVENOUS | Status: DC | PRN
  Administered 2013-03-16 (×2): 20 mg via INTRAVENOUS
  Administered 2013-03-16: 50 mg via INTRAVENOUS

## 2013-03-16 MED ORDER — OXYCODONE HCL 5 MG PO TABS
5.0000 mg | ORAL_TABLET | ORAL | Status: DC | PRN
  Administered 2013-03-17: 10 mg via ORAL
  Administered 2013-03-17: 5 mg via ORAL
  Filled 2013-03-16: qty 2
  Filled 2013-03-16: qty 1

## 2013-03-16 MED ORDER — ALBUTEROL SULFATE HFA 108 (90 BASE) MCG/ACT IN AERS: 2.0000 | INHALATION_SPRAY | Freq: Four times a day (QID) | RESPIRATORY_TRACT | Status: DC | PRN

## 2013-03-16 MED ORDER — ONDANSETRON HCL 4 MG/2ML IJ SOLN
4.0000 mg | Freq: Four times a day (QID) | INTRAMUSCULAR | Status: DC | PRN
  Administered 2013-03-16: 4 mg via INTRAVENOUS
  Filled 2013-03-16: qty 2

## 2013-03-16 MED ORDER — HYDROCHLOROTHIAZIDE 25 MG PO TABS
25.0000 mg | ORAL_TABLET | Freq: Every day | ORAL | Status: DC
  Administered 2013-03-17 – 2013-03-18 (×2): 25 mg via ORAL
  Filled 2013-03-16 (×3): qty 1

## 2013-03-16 MED ORDER — PHENOL 1.4 % MT LIQD: 1.0000 | OROMUCOSAL | Status: DC | PRN

## 2013-03-16 MED ORDER — ACETAMINOPHEN 10 MG/ML IV SOLN
1000.0000 mg | Freq: Four times a day (QID) | INTRAVENOUS | Status: AC
  Administered 2013-03-16 (×2): 1000 mg via INTRAVENOUS
  Filled 2013-03-16 (×4): qty 100

## 2013-03-16 MED ORDER — HYDROMORPHONE HCL PF 1 MG/ML IJ SOLN
1.0000 mg | INTRAMUSCULAR | Status: DC | PRN
  Administered 2013-03-16: 1 mg via INTRAVENOUS
  Filled 2013-03-16: qty 1

## 2013-03-16 MED ORDER — GLYCOPYRROLATE 0.2 MG/ML IJ SOLN
INTRAMUSCULAR | Status: DC | PRN
  Administered 2013-03-16: 0.6 mg via INTRAVENOUS

## 2013-03-16 MED ORDER — RIVAROXABAN 10 MG PO TABS
10.0000 mg | ORAL_TABLET | Freq: Every day | ORAL | Status: DC
  Administered 2013-03-17 – 2013-03-18 (×2): 10 mg via ORAL
  Filled 2013-03-16 (×3): qty 1

## 2013-03-16 MED ORDER — PROPOFOL 10 MG/ML IV BOLUS
INTRAVENOUS | Status: DC | PRN
  Administered 2013-03-16: 150 mg via INTRAVENOUS

## 2013-03-16 MED ORDER — MEPERIDINE HCL 25 MG/ML IJ SOLN: 6.2500 mg | INTRAMUSCULAR | Status: DC | PRN

## 2013-03-16 MED ORDER — CELECOXIB 200 MG PO CAPS
200.0000 mg | ORAL_CAPSULE | Freq: Two times a day (BID) | ORAL | Status: DC
  Administered 2013-03-16 – 2013-03-18 (×5): 200 mg via ORAL
  Filled 2013-03-16 (×6): qty 1

## 2013-03-16 MED ORDER — KCL IN DEXTROSE-NACL 20-5-0.9 MEQ/L-%-% IV SOLN
INTRAVENOUS | Status: DC
  Administered 2013-03-16: 14:00:00 via INTRAVENOUS
  Filled 2013-03-16 (×5): qty 1000

## 2013-03-16 MED ORDER — ONDANSETRON HCL 4 MG/2ML IJ SOLN
INTRAMUSCULAR | Status: DC | PRN
  Administered 2013-03-16: 4 mg via INTRAVENOUS

## 2013-03-16 MED ORDER — ACETAMINOPHEN 650 MG RE SUPP: 650.0000 mg | Freq: Four times a day (QID) | RECTAL | Status: DC | PRN

## 2013-03-16 MED ORDER — ACETAMINOPHEN 10 MG/ML IV SOLN
INTRAVENOUS | Status: AC
  Administered 2013-03-16: 1000 mg via INTRAVENOUS
  Filled 2013-03-16: qty 100

## 2013-03-16 MED ORDER — HYDROMORPHONE HCL PF 1 MG/ML IJ SOLN
INTRAMUSCULAR | Status: AC
  Filled 2013-03-16: qty 1

## 2013-03-16 MED ORDER — CHLORHEXIDINE GLUCONATE 4 % EX LIQD: 60.0000 mL | Freq: Once | CUTANEOUS | Status: DC

## 2013-03-16 MED ORDER — NEOSTIGMINE METHYLSULFATE 1 MG/ML IJ SOLN
INTRAMUSCULAR | Status: DC | PRN
  Administered 2013-03-16: 4 mg via INTRAVENOUS

## 2013-03-16 MED ORDER — ALUM & MAG HYDROXIDE-SIMETH 200-200-20 MG/5ML PO SUSP: 30.0000 mL | ORAL | Status: DC | PRN

## 2013-03-16 MED ORDER — HYDROCODONE-ACETAMINOPHEN 7.5-325 MG PO TABS
1.0000 | ORAL_TABLET | Freq: Four times a day (QID) | ORAL | Status: DC
  Administered 2013-03-17 – 2013-03-18 (×6): 1 via ORAL
  Filled 2013-03-16 (×7): qty 1

## 2013-03-16 MED ORDER — LIDOCAINE HCL (CARDIAC) 20 MG/ML IV SOLN
INTRAVENOUS | Status: DC | PRN
  Administered 2013-03-16: 80 mg via INTRAVENOUS

## 2013-03-16 MED ORDER — PNEUMOCOCCAL VAC POLYVALENT 25 MCG/0.5ML IJ INJ
0.5000 mL | INJECTION | INTRAMUSCULAR | Status: AC
  Administered 2013-03-18: 0.5 mL via INTRAMUSCULAR
  Filled 2013-03-16 (×2): qty 0.5

## 2013-03-16 MED ORDER — METHYLPREDNISOLONE SODIUM SUCC 40 MG IJ SOLR
40.0000 mg | Freq: Two times a day (BID) | INTRAMUSCULAR | Status: AC
  Administered 2013-03-16 – 2013-03-17 (×2): 40 mg via INTRAVENOUS
  Filled 2013-03-16 (×2): qty 1

## 2013-03-16 MED ORDER — ACETAMINOPHEN 325 MG PO TABS: 650.0000 mg | ORAL_TABLET | Freq: Four times a day (QID) | ORAL | Status: DC | PRN

## 2013-03-16 MED ORDER — ALBUTEROL SULFATE HFA 108 (90 BASE) MCG/ACT IN AERS
2.0000 | INHALATION_SPRAY | Freq: Four times a day (QID) | RESPIRATORY_TRACT | Status: DC | PRN
  Filled 2013-03-16: qty 6.7

## 2013-03-16 MED ORDER — METOCLOPRAMIDE HCL 5 MG/ML IJ SOLN
5.0000 mg | Freq: Three times a day (TID) | INTRAMUSCULAR | Status: DC | PRN
  Administered 2013-03-16: 10 mg via INTRAVENOUS
  Filled 2013-03-16: qty 2

## 2013-03-16 MED ORDER — IRBESARTAN 150 MG PO TABS
150.0000 mg | ORAL_TABLET | Freq: Every day | ORAL | Status: DC
  Administered 2013-03-16 – 2013-03-18 (×3): 150 mg via ORAL
  Filled 2013-03-16 (×3): qty 1

## 2013-03-16 MED ORDER — CHOLECALCIFEROL 10 MCG (400 UNIT) PO TABS
400.0000 [IU] | ORAL_TABLET | Freq: Every day | ORAL | Status: DC
  Administered 2013-03-16 – 2013-03-18 (×3): 400 [IU] via ORAL
  Filled 2013-03-16 (×4): qty 1

## 2013-03-16 MED ORDER — FENTANYL CITRATE 0.05 MG/ML IJ SOLN
INTRAMUSCULAR | Status: DC | PRN
  Administered 2013-03-16: 100 ug via INTRAVENOUS
  Administered 2013-03-16: 50 ug via INTRAVENOUS
  Administered 2013-03-16: 150 ug via INTRAVENOUS
  Administered 2013-03-16 (×2): 50 ug via INTRAVENOUS
  Administered 2013-03-16: 100 ug via INTRAVENOUS

## 2013-03-16 MED ORDER — HYDROMORPHONE HCL PF 1 MG/ML IJ SOLN
0.2500 mg | INTRAMUSCULAR | Status: DC | PRN
  Administered 2013-03-16 (×3): 0.5 mg via INTRAVENOUS

## 2013-03-16 MED ORDER — OXYCODONE HCL 5 MG/5ML PO SOLN: 5.0000 mg | Freq: Once | ORAL | Status: DC | PRN

## 2013-03-16 MED ORDER — ONDANSETRON HCL 4 MG PO TABS: 4.0000 mg | ORAL_TABLET | Freq: Four times a day (QID) | ORAL | Status: DC | PRN

## 2013-03-16 MED ORDER — BUPIVACAINE-EPINEPHRINE (PF) 0.5% -1:200000 IJ SOLN
INTRAMUSCULAR | Status: AC
  Filled 2013-03-16: qty 10

## 2013-03-16 MED ORDER — ONDANSETRON HCL 4 MG/2ML IJ SOLN: 4.0000 mg | Freq: Once | INTRAMUSCULAR | Status: DC | PRN

## 2013-03-16 MED ORDER — VALSARTAN-HYDROCHLOROTHIAZIDE 160-25 MG PO TABS: 1.0000 | ORAL_TABLET | Freq: Every day | ORAL | Status: DC

## 2013-03-16 MED ORDER — SODIUM CHLORIDE 0.9 % IR SOLN
Status: DC | PRN
  Administered 2013-03-16: 1000 mL

## 2013-03-16 MED ORDER — LACTATED RINGERS IV SOLN
INTRAVENOUS | Status: DC | PRN
  Administered 2013-03-16 (×2): via INTRAVENOUS

## 2013-03-16 MED ORDER — METOCLOPRAMIDE HCL 10 MG PO TABS: 5.0000 mg | ORAL_TABLET | Freq: Three times a day (TID) | ORAL | Status: DC | PRN

## 2013-03-16 SURGICAL SUPPLY — 67 items
ADH SKN CLS APL DERMABOND .7 (GAUZE/BANDAGES/DRESSINGS) ×1
APL SKNCLS STERI-STRIP NONHPOA (GAUZE/BANDAGES/DRESSINGS) ×1
BENZOIN TINCTURE PRP APPL 2/3 (GAUZE/BANDAGES/DRESSINGS) ×2 IMPLANT
BLADE SAW SAG 73X25 THK (BLADE) ×1
BLADE SAW SGTL 73X25 THK (BLADE) ×1 IMPLANT
BRUSH FEMORAL CANAL (MISCELLANEOUS) IMPLANT
CLOTH BEACON ORANGE TIMEOUT ST (SAFETY) ×2 IMPLANT
COVER BACK TABLE 24X17X13 BIG (DRAPES) IMPLANT
COVER SURGICAL LIGHT HANDLE (MISCELLANEOUS) ×2 IMPLANT
DERMABOND ADVANCED (GAUZE/BANDAGES/DRESSINGS) ×1
DERMABOND ADVANCED .7 DNX12 (GAUZE/BANDAGES/DRESSINGS) ×1 IMPLANT
DRAPE INCISE IOBAN 66X45 STRL (DRAPES) ×2 IMPLANT
DRAPE ORTHO SPLIT 77X108 STRL (DRAPES) ×4
DRAPE SURG ORHT 6 SPLT 77X108 (DRAPES) ×2 IMPLANT
DRAPE U-SHAPE 47X51 STRL (DRAPES) ×2 IMPLANT
DRILL BIT 7/64X5 (BIT) ×2 IMPLANT
DRSG MEPILEX BORDER 4X8 (GAUZE/BANDAGES/DRESSINGS) ×2 IMPLANT
DURAPREP 26ML APPLICATOR (WOUND CARE) ×2 IMPLANT
ELECT BLADE 6.5 EXT (BLADE) ×1 IMPLANT
ELECT REM PT RETURN 9FT ADLT (ELECTROSURGICAL) ×2
ELECTRODE REM PT RTRN 9FT ADLT (ELECTROSURGICAL) ×1 IMPLANT
EVACUATOR 1/8 PVC DRAIN (DRAIN) ×2 IMPLANT
FACESHIELD LNG OPTICON STERILE (SAFETY) ×4 IMPLANT
FILTER STRAW FLUID ASPIR (MISCELLANEOUS) ×2 IMPLANT
GLOVE BIO SURGEON STRL SZ8.5 (GLOVE) ×1 IMPLANT
GLOVE BIOGEL PI IND STRL 7.5 (GLOVE) ×1 IMPLANT
GLOVE BIOGEL PI INDICATOR 7.5 (GLOVE) ×1
GLOVE ECLIPSE 7.0 STRL STRAW (GLOVE) ×3 IMPLANT
GLOVE ECLIPSE 8.5 STRL (GLOVE) ×2 IMPLANT
GLOVE SURG 8.5 LATEX PF (GLOVE) ×2 IMPLANT
GLOVE SURG SS PI 8.5 STRL IVOR (GLOVE) ×1
GLOVE SURG SS PI 8.5 STRL STRW (GLOVE) IMPLANT
GOWN PREVENTION PLUS LG XLONG (DISPOSABLE) IMPLANT
GOWN PREVENTION PLUS XXLARGE (GOWN DISPOSABLE) ×4 IMPLANT
GOWN STRL NON-REIN LRG LVL3 (GOWN DISPOSABLE) ×4 IMPLANT
GOWN STRL REIN XL XLG (GOWN DISPOSABLE) ×1 IMPLANT
HANDPIECE INTERPULSE COAX TIP (DISPOSABLE)
IMMOBILIZER KNEE 20 (SOFTGOODS) ×2
IMMOBILIZER KNEE 20 THIGH 36 (SOFTGOODS) ×1 IMPLANT
IMMOBILIZER KNEE 22 UNIV (SOFTGOODS) ×1 IMPLANT
KIT BASIN OR (CUSTOM PROCEDURE TRAY) ×2 IMPLANT
KIT ROOM TURNOVER OR (KITS) ×2 IMPLANT
MANIFOLD NEPTUNE II (INSTRUMENTS) ×2 IMPLANT
NEEDLE 22X1 1/2 (OR ONLY) (NEEDLE) ×2 IMPLANT
NS IRRIG 1000ML POUR BTL (IV SOLUTION) ×2 IMPLANT
PACK TOTAL JOINT (CUSTOM PROCEDURE TRAY) ×2 IMPLANT
PAD ARMBOARD 7.5X6 YLW CONV (MISCELLANEOUS) ×4 IMPLANT
PASSER SUT SWANSON 36MM LOOP (INSTRUMENTS) ×2 IMPLANT
PRESSURIZER FEMORAL UNIV (MISCELLANEOUS) IMPLANT
SET HNDPC FAN SPRY TIP SCT (DISPOSABLE) IMPLANT
SPONGE LAP 4X18 X RAY DECT (DISPOSABLE) ×2 IMPLANT
STRIP CLOSURE SKIN 1/2X4 (GAUZE/BANDAGES/DRESSINGS) ×4 IMPLANT
SUCTION FRAZIER TIP 10 FR DISP (SUCTIONS) ×2 IMPLANT
SUT ETHIBOND NAB CT1 #1 30IN (SUTURE) ×8 IMPLANT
SUT VIC AB 0 CT1 27 (SUTURE) ×4
SUT VIC AB 0 CT1 27XBRD ANBCTR (SUTURE) ×2 IMPLANT
SUT VIC AB 1 CT1 27 (SUTURE) ×4
SUT VIC AB 1 CT1 27XBRD ANBCTR (SUTURE) ×2 IMPLANT
SUT VIC AB 2-0 CT1 27 (SUTURE) ×4
SUT VIC AB 2-0 CT1 TAPERPNT 27 (SUTURE) ×2 IMPLANT
SUT VICRYL 4-0 PS2 18IN ABS (SUTURE) ×2 IMPLANT
SYR CONTROL 10ML LL (SYRINGE) ×2 IMPLANT
TOWEL OR 17X24 6PK STRL BLUE (TOWEL DISPOSABLE) ×2 IMPLANT
TOWEL OR 17X26 10 PK STRL BLUE (TOWEL DISPOSABLE) ×2 IMPLANT
TOWER CARTRIDGE SMART MIX (DISPOSABLE) IMPLANT
TRAY FOLEY CATH 14FR (SET/KITS/TRAYS/PACK) ×2 IMPLANT
WATER STERILE IRR 1000ML POUR (IV SOLUTION) ×6 IMPLANT

## 2013-03-16 NOTE — Plan of Care (Signed)
Problem: Consults Goal: Diagnosis- Total Joint Replacement Primary Total Hip Right     

## 2013-03-16 NOTE — Preoperative (Signed)
Beta Blockers   Reason not to administer Beta Blockers:Not Applicable 

## 2013-03-16 NOTE — Brief Op Note (Signed)
03/16/2013  9:55 AM  PATIENT:  Kathy Barr  57 y.o. female  PRE-OPERATIVE DIAGNOSIS:  Severe osteoarthritis right hip  POST-OPERATIVE DIAGNOSIS:  Severe osteoarthritis right hip  PROCEDURE:  Procedure(s): RIGHT TOTAL HIP ARTHROPLASTY- right  (Right)  SURGEON:  Surgeon(s) and Role:    * Kerrin Champagne, MD - Primary  PHYSICIAN ASSISTANT: Maud Deed, PA-C  ANESTHESIA:   general and Dr. Michelle Piper  EBL:  Total I/O In: 500 [I.V.:500] Out: 500 [Urine:100; Blood:400]  BLOOD ADMINISTERED:none  DRAINS: Urinary Catheter (Foley)   LOCAL MEDICATIONS USED:  NONE  SPECIMEN:  No Specimen  DISPOSITION OF SPECIMEN:  N/A  COUNTS:  YES  TOURNIQUET:  * No tourniquets in log *  DICTATION: .Dragon Dictation  PLAN OF CARE: Admit to inpatient   PATIENT DISPOSITION:  PACU - hemodynamically stable.   Delay start of Pharmacological VTE agent (>24hrs) due to surgical blood loss or risk of bleeding: no

## 2013-03-16 NOTE — Anesthesia Postprocedure Evaluation (Signed)
Anesthesia Post Note  Patient: Kathy Barr  Procedure(s) Performed: Procedure(s) (LRB): RIGHT TOTAL HIP ARTHROPLASTY- right  (Right)  Anesthesia type: general  Patient location: PACU  Post pain: Pain level controlled  Post assessment: Patient's Cardiovascular Status Stable  Last Vitals:  Filed Vitals:   03/16/13 1205  BP: 112/64  Pulse: 77  Temp: 36.5 C  Resp: 16    Post vital signs: Reviewed and stable  Level of consciousness: sedated  Complications: No apparent anesthesia complications

## 2013-03-16 NOTE — Op Note (Signed)
03/16/2013  9:57 AM  OPERATIVE REPORT   PATIENT:  Kathy Barr  57 y.o. female  MRN: [id number]  PRE-OPERATIVE DIAGNOSIS:  Severe osteoarthritis right hip  POST-OPERATIVE DIAGNOSIS:  Severe osteoarthritis right hip  PROCEDURE:  Procedure(s): RIGHT TOTAL HIP ARTHROPLASTY- right     SURGEON: Kerrin Champagne, MD    ASSISTANT: Maud Deed, PA-C  (Present throughout the entire procedure and necessary for completion of procedure in a timely manner)     ANESTHESIA:  General, Dr. Michelle Piper.    COMPLICATIONS:  None.     COMPONENTS:  [place] [place] press fit # 4 femoral component, a 36 -mm  outer diameter metal ball with a -  2 mm neck length, a 54-mm outer  diameter Pinnacle acetabulum, and a polyethylene liner  +4, 10-degree   posterior lip.  Components were Press-Fit.    PROCEDURE IN DETAIL: The patient was met in the holding area and  identified. The appropriate right hip was identified and marked at the operative site. Preoperative antibiotics 2 g Ancef were given . The patient was then transported to the OR and  placed under general l anesthesia. At that point, the patient was  placed in the lateral decubitus position with the operative side up and  secured to the operating room table with the Innomed hip system.  The operative lower extremity was prepped from the iliac crest to the distal  leg with Betadine scrub and then DuraPrep. Sterile draping was  performed.  A routine southern incision was utilized and via sharp dissection  carried down to the subcutaneous tissue. Gross bleeders were Bovie  coagulated. The iliotibial band was quickly identified and incised  along the length of the skin incision. Self-retaining retractors were  inserted. With the hip internally rotated, the short external rotators  were identified. Tendinous structures were tagged with 0 Ethibond  suture. The hip capsule was identified and incised along the femoral  neck and head. There was a moderate clear  yellow joint effusion. Hip was  dislocated posteriorly. It was misshapen and subchondral fracture with cartilage  flap. The femoral neck was then osteotomized using a  calcar guide and removed from the wound. Labral resection was performed  from the acetabulum.  The femoral neck osteotomy was placed about 5 mm proximal to the lesser trochanter.  A starter hole was then made through the piriformis fossa. Canal finder was utilized then lateralizing reamer. Reaming was  performed to the appropriate size #4. I had nice endosteal purchase. Rasping was performed sequentially to the appropriate #4 uncemented rasp.  Retractors were then placed about the acetabulum. Further capsule section  was performed. There was a large degenerative labrum that was also  excised. Retractor was placed about the acetabulum. It was somewhat  misshapen because of the superior migration of the femoral head.  Reaming was performed sequentially to the 53mm size.  It had very nice bleeding circumferentially and a nice strong thick  acetabulum. I then trialed the 54 mm acetabular component. It had  complete seating. Accordingly, the trial acetabular component was  impacted into the acetabulum. It was a very nice fit. It was nice and  stable.  The trial 10 polyethylene liner was inserted followed by the femoral rasp. We trialed a number of neck lengths and felt like trial -2 mm neck and 36 mm ball was the most stable. At that point, there was minimal toggling and complete stability. Leg lengths were appropriate.  The trial components were then removed.  The joint was copiously  irrigated with saline solution. A permanent Pinnacle porous-coated 54 mm acetabular implant was then impacted into place observed to be fully seated in approximately 30 of abduction and 20 anteversion. Apex hole eliminator was inserted into  the acetabular component followed by the final Marathon 10 polyethylene liner.  The #4 uncemented femoral  component was then impacted onto the calcar. Wound was again irrigated.  The trial head was then removed. We cleaned the [facility] taper neck and  inserted the final 36mm -2mm head. This was reduced, and through a full range of motion, it was perfectly stable and there was no subluxation. There was no evidence of  instability. It had a very nice construct.  Wound was then irrigated with saline solution. The capsule was closed  anatomically with #1 Ethibond. The short external rotators were closed  with similar material through drill hole to the greater trochanter using a suture passer to pass the suture. The wound was again irrigated with saline  solution. The iliotibial band was closed with reluctant #1 Ethibond, subcu  was closed with #1 Vicryl and 0 Vicryl, subcutaneous layers were reapproximated with interrupted 0 and 2-0 Vicryl sutures.Skin was closed with skin  clips. All instruments and sponge counts were correct Mepilex dressing was applied and held in place with Hypafix tape. Knee immobilizer was placed. The patient was then placed in the supine position,  awoken, placed on the operating stretcher, and returned to the  postanesthesia recovery room in satisfactory condition. Maud Deed PA-C perform the duties of assistant surgeon she performed careful retraction of soft tissues, assisted in addition, the patient positioning on OR table. She was present from beginning to the end of the case and performed closure of the incision from the iliotibial band and tensor fascia to the skin and application of dressing.   NITKA,JAMES E  03/16/2013 9:57 AM

## 2013-03-16 NOTE — Progress Notes (Signed)
SW will await PT/OT notes for discharge disposition.  Sabino Niemann, MSW,  [phone]

## 2013-03-16 NOTE — Anesthesia Preprocedure Evaluation (Addendum)
Anesthesia Evaluation  Patient identified by MRN, date of birth, ID band Patient awake    Reviewed: Allergy & Precautions, H&P , NPO status , Patient's Chart, lab work & pertinent test results, reviewed documented beta blocker date and time   Airway Mallampati: II  Neck ROM: Full    Dental  (+) Edentulous Upper, Edentulous Lower and Dental Advisory Given   Pulmonary shortness of breath and with exertion,  breath sounds clear to auscultation        Cardiovascular hypertension, Pt. on medications Rhythm:Regular Rate:Normal     Neuro/Psych  Neuromuscular disease    GI/Hepatic GERD-  Controlled,  Endo/Other    Renal/GU      Musculoskeletal   Abdominal (+)  Abdomen: soft. Bowel sounds: normal.  Peds  Hematology   Anesthesia Other Findings   Reproductive/Obstetrics                         Anesthesia Physical Anesthesia Plan  ASA: III  Anesthesia Plan: General   Post-op Pain Management:    Induction: Intravenous  Airway Management Planned: Oral ETT  Additional Equipment:   Intra-op Plan:   Post-operative Plan: Extubation in OR  Informed Consent:   Plan Discussed with: CRNA and Surgeon  Anesthesia Plan Comments:         Anesthesia Quick Evaluation

## 2013-03-16 NOTE — Evaluation (Signed)
Physical Therapy Evaluation Patient Details Name: Kathy Barr MRN: [id number] DOB: Mar 28, 1956 Today's Date: 03/16/2013 Time: 4098-1191 PT Time Calculation (min): 34 min  PT Assessment / Plan / Recommendation Clinical Impression  Pt is a 57 yo female s/p R TKA presenting with R hip pain, R LE weakness, and difficulty walking as anticipated post R TKA. Pt however mobilizing extremely well for same day surgery. Anticipate pt to be safe for d/c home with HHPT, use of RW, 3n1 commode and assist from significant other. Pt familar with rehab as she had her L THA a few years ago.    PT Assessment  Patient needs continued PT services    Follow Up Recommendations  Home health PT;Supervision/Assistance - 24 hour    Does the patient have the potential to tolerate intense rehabilitation      Barriers to Discharge None      Equipment Recommendations  None recommended by PT (pt with all recommended DME)    Recommendations for Other Services     Frequency 7X/week    Precautions / Restrictions Precautions Precautions: Posterior Hip;Fall Precaution Booklet Issued: Yes (comment) Restrictions Weight Bearing Restrictions: Yes RLE Weight Bearing: Weight bearing as tolerated   Pertinent Vitals/Pain 8/10 R hip surgical pain      Mobility  Bed Mobility Bed Mobility: Supine to Sit;Sitting - Scoot to Edge of Bed Supine to Sit: 4: Min guard;HOB elevated;With rails Sitting - Scoot to Edge of Bed: 4: Min assist Details for Bed Mobility Assistance: pt required increased time due to nausea but completed transfer with good technique Transfers Transfers: Sit to Stand;Stand to Sit Sit to Stand: 4: Min assist;With upper extremity assist;From bed Stand to Sit: With upper extremity assist;To chair/3-in-1;4: Min assist Details for Transfer Assistance: v/c's for hand placement, minA to manage R LE during stand to sit Ambulation/Gait Ambulation/Gait Assistance: 4: Min assist Ambulation Distance (Feet):  50 Feet Assistive device: Rolling walker Ambulation/Gait Assistance Details: v/c's for sequencing and walker management Gait Pattern: Step-to pattern;Decreased step length - right;Decreased stance time - right Gait velocity: slow Stairs: No    Exercises Total Joint Exercises Ankle Circles/Pumps: AROM;Both;10 reps;Supine Quad Sets: AROM;Both;10 reps;Supine Gluteal Sets: AROM;Both;10 reps;Supine Heel Slides: AROM;Right;10 reps;Supine   PT Diagnosis: Difficulty walking  PT Problem List: Decreased activity tolerance;Decreased balance;Decreased mobility;Decreased knowledge of use of DME;Decreased knowledge of precautions PT Treatment Interventions: DME instruction;Gait training;Stair training;Functional mobility training;Therapeutic exercise;Therapeutic activities;Balance training   PT Goals Acute Rehab PT Goals PT Goal Formulation: With patient Time For Goal Achievement: 03/23/13 Potential to Achieve Goals: Good Pt will go Supine/Side to Sit: with modified independence;with HOB 0 degrees PT Goal: Supine/Side to Sit - Progress: Goal set today Pt will go Sit to Supine/Side: with modified independence;with HOB 0 degrees PT Goal: Sit to Supine/Side - Progress: Goal set today Pt will go Sit to Stand: with modified independence;with upper extremity assist (up to RW) PT Goal: Sit to Stand - Progress: Goal set today Pt will Ambulate: >150 feet;with modified independence;with rolling walker PT Goal: Ambulate - Progress: Goal set today Additional Goals Additional Goal #1: Pt to recall post hip prec independently and be 100% compliant. PT Goal: Additional Goal #1 - Progress: Goal set today  Visit Information  Last PT Received On: 03/16/13 Assistance Needed: +1    Subjective Data  Subjective: Pt received supine in bed agreeable to PT. C/o 8/10 R hip pain   Prior Functioning  Home Living Lives With: Significant other Available Help at Discharge: Family;Available 24 hours/day Type  of Home:  House Home Access: Level entry Home Layout: One level Bathroom Shower/Tub: Engineer, manufacturing systems: Standard Bathroom Accessibility: Yes How Accessible: Accessible via walker Home Adaptive Equipment: Bedside commode/3-in-1;Shower chair with back;Walker - rolling Prior Function Level of Independence: Independent Able to Take Stairs?: Yes Driving: Yes Vocation: On disability Communication Communication: No difficulties Dominant Hand: Left    Cognition  Cognition Overall Cognitive Status: Appears within functional limits for tasks assessed/performed Arousal/Alertness: Awake/alert Orientation Level: Oriented X4 / Intact Behavior During Session: [facility] for tasks performed    Extremity/Trunk Assessment Right Upper Extremity Assessment RUE ROM/Strength/Tone: Within functional levels Left Upper Extremity Assessment LUE ROM/Strength/Tone: Within functional levels Right Lower Extremity Assessment RLE ROM/Strength/Tone: Deficits RLE ROM/Strength/Tone Deficits: ankle/knee WFL, hip not tested but pt able to move R LE off EOB without assist RLE Sensation: WFL - Light Touch Left Lower Extremity Assessment LLE ROM/Strength/Tone: Within functional levels LLE Sensation: WFL - Light Touch Trunk Assessment Trunk Assessment: Normal   Balance    End of Session PT - End of Session Equipment Utilized During Treatment: Gait belt Activity Tolerance: Patient tolerated treatment well Patient left: in chair;with call bell/phone within reach;with family/visitor present Nurse Communication: Mobility status  GP     Marcene Brawn 03/16/2013, 4:54 PM  Lewis Shock, PT, DPT Pager #: [phone] Office #: [phone]

## 2013-03-16 NOTE — H&P (Signed)
Patient was seen and examined in the preop holding area. There has been no interval  Change in this patient's exam preop  history and physical exam  Lab tests and images have been examined and reviewed.  The Risks benefits and alternative treatments have been discussed  extensively,questions answered.  The patient has elected to undergo the discussed surgical treatment. 

## 2013-03-16 NOTE — Progress Notes (Signed)
Orthopedic Tech Progress Note Patient Details:  Kathy Barr Apr 09, 1956 [id number] Applied overhead frame to bed.     Citlally, Captain 03/16/2013, 5:12 PM

## 2013-03-16 NOTE — Progress Notes (Signed)
UR COMPLETED  

## 2013-03-16 NOTE — Anesthesia Procedure Notes (Signed)
Procedure Name: Intubation Date/Time: 03/16/2013 7:40 AM Performed by: Ellin Goodie Pre-anesthesia Checklist: Patient identified, Emergency Drugs available, Suction available, Patient being monitored and Timeout performed Patient Re-evaluated:Patient Re-evaluated prior to inductionOxygen Delivery Method: Circle system utilized Preoxygenation: Pre-oxygenation with 100% oxygen Intubation Type: IV induction Ventilation: Mask ventilation without difficulty Laryngoscope Size: Mac and 3 Grade View: Grade I Tube type: Oral Tube size: 7.5 mm Number of attempts: 1 Airway Equipment and Method: Stylet Placement Confirmation: ETT inserted through vocal cords under direct vision,  positive ETCO2 and breath sounds checked- equal and bilateral Secured at: 22 cm Tube secured with: Tape Dental Injury: Teeth and Oropharynx as per pre-operative assessment

## 2013-03-16 NOTE — Transfer of Care (Signed)
Immediate Anesthesia Transfer of Care Note  Patient: Kathy Barr  Procedure(s) Performed: Procedure(s): RIGHT TOTAL HIP ARTHROPLASTY- right  (Right)  Patient Location: PACU  Anesthesia Type:General  Level of Consciousness: awake, alert  and oriented  Airway & Oxygen Therapy: Patient Spontanous Breathing and Patient connected to face mask  Post-op Assessment: Report given to PACU RN  Post vital signs: stable  Complications: No apparent anesthesia complications

## 2013-03-17 ENCOUNTER — Encounter (HOSPITAL_COMMUNITY): Payer: Self-pay | Admitting: Specialist

## 2013-03-17 LAB — CBC
HCT: 31.7 % — ABNORMAL LOW (ref 36.0–46.0)
Hemoglobin: 11.1 g/dL — ABNORMAL LOW (ref 12.0–15.0)
WBC: 13.2 10*3/uL — ABNORMAL HIGH (ref 4.0–10.5)

## 2013-03-17 LAB — BASIC METABOLIC PANEL
BUN: 6 mg/dL (ref 6–23)
Chloride: 104 mEq/L (ref 96–112)
Glucose, Bld: 183 mg/dL — ABNORMAL HIGH (ref 70–99)
Potassium: 3.6 mEq/L (ref 3.5–5.1)

## 2013-03-17 NOTE — Progress Notes (Signed)
Physical Therapy Treatment Patient Details Name: Kathy Barr MRN: [id number] DOB: 09-22-1956 Today's Date: 03/17/2013 Time: 1478-2956 PT Time Calculation (min): 32 min  PT Assessment / Plan / Recommendation Comments on Treatment Session  pt presents with R THA.  pt moving great and anticipate will continue to make good progress.      Follow Up Recommendations  Home health PT;Supervision/Assistance - 24 hour     Does the patient have the potential to tolerate intense rehabilitation     Barriers to Discharge        Equipment Recommendations  None recommended by PT    Recommendations for Other Services    Frequency 7X/week   Plan Discharge plan remains appropriate;Frequency remains appropriate    Precautions / Restrictions Precautions Precautions: Posterior Hip;Fall Precaution Comments: pt verbalized 2/3 hip precautions.  Reviewed precautions.   Restrictions Weight Bearing Restrictions: Yes RLE Weight Bearing: Weight bearing as tolerated   Pertinent Vitals/Pain Indicates pain as tolerable.      Mobility  Bed Mobility Bed Mobility: Supine to Sit;Sitting - Scoot to Edge of Bed Supine to Sit: 6: Modified independent (Device/Increase time) Sitting - Scoot to Edge of Bed: 6: Modified independent (Device/Increase time) Details for Bed Mobility Assistance: Needs increased time, but demos good technique.   Transfers Transfers: Sit to Stand;Stand to Sit Sit to Stand: 5: Supervision;With upper extremity assist;From bed Stand to Sit: 5: Supervision;With upper extremity assist;To chair/3-in-1;With armrests;To bed Details for Transfer Assistance: cues for UE use and controlling descent to sit.   Ambulation/Gait Ambulation/Gait Assistance: 4: Min guard Ambulation Distance (Feet): 120 Feet (x2) Assistive device: Rolling walker Ambulation/Gait Assistance Details: cues for upright posture and encouragement.   Gait Pattern: Decreased step length - left;Decreased stance time -  right;Step-through pattern;Decreased stride length Stairs: No Wheelchair Mobility Wheelchair Mobility: No    Exercises Total Joint Exercises Ankle Circles/Pumps: AROM;Both;10 reps Long Arc Quad: AROM;Right;10 reps   PT Diagnosis:    PT Problem List:   PT Treatment Interventions:     PT Goals Acute Rehab PT Goals Time For Goal Achievement: 03/23/13 Potential to Achieve Goals: Good PT Goal: Supine/Side to Sit - Progress: Progressing toward goal PT Goal: Sit to Stand - Progress: Progressing toward goal PT Goal: Ambulate - Progress: Progressing toward goal Additional Goals PT Goal: Additional Goal #1 - Progress: Progressing toward goal  Visit Information  Last PT Received On: 03/17/13 Assistance Needed: +1    Subjective Data  Subjective: I'm not doing too bad.     Cognition  Cognition Overall Cognitive Status: Appears within functional limits for tasks assessed/performed Arousal/Alertness: Awake/alert Orientation Level: Oriented X4 / Intact Behavior During Session: [facility] for tasks performed    Balance  Balance Balance Assessed: No  End of Session PT - End of Session Equipment Utilized During Treatment: Gait belt Activity Tolerance: Patient tolerated treatment well Patient left: in chair;with call bell/phone within reach;with family/visitor present Nurse Communication: Mobility status   GP     Kathy Barr, [place] [phone] 03/17/2013, 10:06 AM

## 2013-03-17 NOTE — Progress Notes (Signed)
Patient examined and lab reviewed with Vernon PA-C. 

## 2013-03-17 NOTE — Progress Notes (Signed)
PT is recommending home with HH and not SNF. CSW will make CM aware. Clinical Social Worker will sign off for now as social work intervention is no longer needed. Please consult us again if new need arises.   Joi Leyva, MSW [phone] 

## 2013-03-17 NOTE — Care Management Note (Signed)
CARE MANAGEMENT NOTE 03/17/2013  Patient:  Kathy Barr   Account Number:  [id number]  Date Initiated:  03/15/2013  Documentation initiated by:  Vance Peper  Subjective/Objective Assessment:   57 yr old female adm for right tibial plateau fracture. Going to OR 03/16/13 for ORIF     Action/Plan:   CM spoke with patient concerning home health  and DME needs at discharge.Will go to her Mom's home:  [address], [place] [address]  [phone]  [phone]   Anticipated DC Date:  03/20/2013   Anticipated DC Plan:  HOME W HOME HEALTH SERVICES      DC Planning Services  CM consult      [facility] Choice  DURABLE MEDICAL EQUIPMENT  HOME HEALTH   Choice offered to / List presented to:     DME arranged  3-N-1  WALKER - ROLLING  WHEELCHAIR - MANUAL  TUB BENCH      DME agency  [facility]        Status of service:  In process, will continue to follow Medicare Important Message given?   (If response is "NO", the following Medicare IM given date fields will be blank) Date Medicare IM given:   Date Additional Medicare IM given:    Discharge Disposition:    Per UR Regulation:    If discussed at Long Length of Stay Meetings, dates discussed:    Comments:  03/17/13  1400 Vance Peper, RN BSN Case Manager CM spoke with patient concerning locating home health in [place] or [place]. She has decided to go be with her mom. patient also requests a statement that she will be incapacitated for school and the [place]. CM spoke with Mellody Dance concerning this. Will continue to work on locating home health agency in [place].

## 2013-03-17 NOTE — Progress Notes (Signed)
[facility]  Patient Status: New  [facility] is providing the following services: PT  If patient discharges after hours, please call [phone].   Kathy Barr 03/17/2013, 4:52 PM

## 2013-03-17 NOTE — Evaluation (Signed)
Occupational Therapy Evaluation Patient Details Name: Kathy Barr MRN: [id number] DOB: August 31, 1956 Today's Date: 03/17/2013 Time: 1043-1100 OT Time Calculation (min): 17 min  OT Assessment / Plan / Recommendation Clinical Impression  Pt is recovering from R THA. Has a hx of L THA.  Pt is knowledgable in post hip precautions related to mobility and ADL.  Has all necessary DME.  Will rely on family for LB ADL.  No further OT needs.    OT Assessment  Patient does not need any further OT services    Follow Up Recommendations  No OT follow up;Supervision - Intermittent    Barriers to Discharge      Equipment Recommendations  None recommended by OT    Recommendations for Other Services    Frequency       Precautions / Restrictions Precautions Precautions: Posterior Hip;Fall Precaution Comments: Pt verbalized and generalized 3/3 hip precautions. Restrictions Weight Bearing Restrictions: Yes RLE Weight Bearing: Weight bearing as tolerated   Pertinent Vitals/Pain R hip, did not rate, premedicated, iced, repositioned    ADL  Eating/Feeding: Independent Where Assessed - Eating/Feeding: Bed level Grooming: Wash/dry hands;Modified independent Where Assessed - Grooming: Unsupported standing Upper Body Bathing: Set up Where Assessed - Upper Body Bathing: Unsupported sitting Lower Body Bathing: Moderate assistance Where Assessed - Lower Body Bathing: Unsupported sitting;Supported sit to stand Upper Body Dressing: Set up Where Assessed - Upper Body Dressing: Unsupported standing Lower Body Dressing: Moderate assistance Where Assessed - Lower Body Dressing: Unsupported sitting;Supported sit to stand Toilet Transfer: Modified independent Toilet Transfer Method: Sit to stand Toilet Transfer Equipment: Raised toilet seat with arms (or 3-in-1 over toilet) Toileting - Clothing Manipulation and Hygiene: Independent Where Assessed - Engineer, mining and Hygiene:  Standing Tub/Shower Transfer:  (declined practice, verbalized appropriate technique) Equipment Used: Rolling walker Transfers/Ambulation Related to ADLs: mod I with RW ADL Comments: Pt able to verbalize post hip precautions.  Is knowledgeable in use of AE, but chooses to have family assist.    OT Diagnosis:    OT Problem List:   OT Treatment Interventions:     OT Goals    Visit Information  Last OT Received On: 03/17/13 Assistance Needed: +1    Subjective Data  Subjective: "My boyfriend will help me with my shoes and socks and wash my feet." Patient Stated Goal: Home tomorrow with assist of her mom, boyfriend.   Prior Functioning     Home Living Lives With: Significant other Available Help at Discharge: Family;Available 24 hours/day Type of Home: House Home Access: Level entry Home Layout: One level Bathroom Shower/Tub: Engineer, manufacturing systems: Standard Bathroom Accessibility: Yes How Accessible: Accessible via walker Home Adaptive Equipment: Bedside commode/3-in-1;Shower chair with back;Walker - rolling Prior Function Level of Independence: Independent Able to Take Stairs?: Yes Driving: Yes Vocation: On disability Communication Communication: No difficulties Dominant Hand: Left         Vision/Perception Vision - History Baseline Vision: Wears glasses all the time   Cognition  Cognition Overall Cognitive Status: Appears within functional limits for tasks assessed/performed Arousal/Alertness: Awake/alert Orientation Level: Oriented X4 / Intact Behavior During Session: [facility] for tasks performed    Extremity/Trunk Assessment Right Upper Extremity Assessment RUE ROM/Strength/Tone: Within functional levels RUE Coordination: WFL - gross/fine motor Left Upper Extremity Assessment LUE ROM/Strength/Tone: Within functional levels LUE Coordination: WFL - gross/fine motor     Mobility Bed Mobility Bed Mobility: Supine to Sit;Sitting - Scoot to Edge of  Bed;Sit to Supine Supine to Sit: 6: Modified  independent (Device/Increase time) Sitting - Scoot to Edge of Bed: 6: Modified independent (Device/Increase time) Sit to Supine: 4: Min assist;HOB flat (for LEs) Details for Bed Mobility Assistance: Needs increased time, but demos good technique.   Transfers Transfers: Sit to Stand;Stand to Sit Sit to Stand: 6: Modified independent (Device/Increase time);With upper extremity assist;From bed;From chair/3-in-1 Stand to Sit: 6: Modified independent (Device/Increase time);With upper extremity assist;To bed;To chair/3-in-1     Exercise    Balance Balance Balance Assessed: No   End of Session OT - End of Session Activity Tolerance: Patient tolerated treatment well Patient left: in bed;with call bell/phone within reach  GO     Evern Bio 03/17/2013, 11:10 AM [phone]

## 2013-03-17 NOTE — Progress Notes (Signed)
Subjective: 1 Day Post-Op Procedure(s) (LRB): RIGHT TOTAL HIP ARTHROPLASTY- right  (Right) Patient reports pain as mild.  Nausea yesterday, now resolved.  Tolerating po meds well.  Objective: Vital signs in last 24 hours: Temp:  [97.2 F (36.2 C)-97.9 F (36.6 C)] 97.8 F (36.6 C) (04/09 0604) Pulse Rate:  [62-93] 69 (04/09 0604) Resp:  [10-20] 18 (04/09 0604) BP: (112-136)/(58-74) 118/58 mmHg (04/09 0604) SpO2:  [92 %-100 %] 98 % (04/09 0604) FiO2 (%):  [28 %] 28 % (04/08 1217)  Intake/Output from previous day: 04/08 0701 - 04/09 0700 In: 2671.3 [P.O.:360; I.V.:2311.3] Out: 4125 [Urine:3725; Blood:400] Intake/Output this shift:     Recent Labs  03/17/13 0625  HGB 11.1*    Recent Labs  03/17/13 0625  WBC 13.2*  RBC 4.08  HCT 31.7*  PLT 214    Recent Labs  03/17/13 0625  NA 138  K 3.6  CL 104  CO2 25  BUN 6  CREATININE 0.59  GLUCOSE 183*  CALCIUM 8.8   No results found for this basename: LABPT, INR,  in the last 72 hours  Neurovascular intact Sensation intact distally Intact pulses distally Dorsiflexion/Plantar flexion intact Incision: dressing C/D/I  Assessment/Plan: 1 Day Post-Op Procedure(s) (LRB): RIGHT TOTAL HIP ARTHROPLASTY- right  (Right) Up with therapy Discharge home with home health next day or Friday depending on progress  Susan Arana M 03/17/2013, 8:16 AM

## 2013-03-17 NOTE — Progress Notes (Signed)
Physical Therapy Note   03/17/13 1200  PT Visit Information  Last PT Received On 03/17/13  Assistance Needed +1  PT Time Calculation  PT Start Time 1104  PT Stop Time 1123  PT Time Calculation (min) 19 min  Subjective Data  Subjective Everybody says I'm doing good!    Precautions  Precautions Posterior Hip;Fall  Precaution Comments Pt verbalized and generalized 3/3 hip precautions.  Restrictions  Weight Bearing Restrictions Yes  RLE Weight Bearing WBAT  Cognition  Overall Cognitive Status Appears within functional limits for tasks assessed/performed  Arousal/Alertness Awake/alert  Orientation Level Oriented X4 / Intact  Behavior During Session [facility] for tasks performed  Bed Mobility  Bed Mobility Supine to Sit;Sitting - Scoot to Edge of Bed;Sit to Supine  Supine to Sit 6: Modified independent (Device/Increase time)  Sitting - Scoot to Edge of Bed 6: Modified independent (Device/Increase time)  Sit to Supine 6: Modified independent (Device/Increase time)  Details for Bed Mobility Assistance Needs increased time.    Transfers  Transfers Sit to Stand;Stand to Sit  Sit to Stand 6: Modified independent (Device/Increase time);With upper extremity assist;From bed  Stand to Sit 6: Modified independent (Device/Increase time);With upper extremity assist;To bed  Details for Transfer Assistance Demos god technique this session.    Ambulation/Gait  Ambulation/Gait Assistance 5: Supervision  Ambulation Distance (Feet) 120 Feet (x2)  Assistive device Rolling walker  Ambulation/Gait Assistance Details cues to decrease WBing on UEs.    Gait Pattern Decreased step length - left;Decreased stance time - right;Step-through pattern;Decreased stride length  Stairs Yes  Stairs Assistance 5: Supervision  Stairs Assistance Details (indicate cue type and reason) cues for safe technique  Stair Management Technique No rails;Forwards;With walker  Number of Stairs 1  Wheelchair Mobility  Wheelchair  Mobility No  Balance  Balance Assessed No  PT - End of Session  Equipment Utilized During Treatment Gait belt  Activity Tolerance Patient tolerated treatment well  Patient left in bed;with call bell/phone within reach;with family/visitor present  Nurse Communication Mobility status  PT - Assessment/Plan  Comments on Treatment Session pt presents with R THA.  pt moving great.  pt ready to D/C from PT stand point.    PT Plan Discharge plan remains appropriate;Frequency remains appropriate  PT Frequency 7X/week  Follow Up Recommendations Home health PT;Supervision/Assistance - 24 hour  PT equipment None recommended by PT  Acute Rehab PT Goals  Time For Goal Achievement 03/23/13  Potential to Achieve Goals Good  PT Goal: Supine/Side to Sit - Progress Met  PT Goal: Sit to Supine/Side - Progress Met  PT Goal: Sit to Stand - Progress Met  PT Goal: Ambulate - Progress Progressing toward goal  Additional Goals  PT Goal: Additional Goal #1 - Progress Progressing toward goal  PT General Charges  $$ ACUTE PT VISIT 1 Procedure  PT Treatments  $Gait Training 8-22 mins   [place], [place] [phone]

## 2013-03-18 ENCOUNTER — Encounter (HOSPITAL_COMMUNITY): Payer: Self-pay | Admitting: General Practice

## 2013-03-18 LAB — CBC
HCT: 29 % — ABNORMAL LOW (ref 36.0–46.0)
Hemoglobin: 10 g/dL — ABNORMAL LOW (ref 12.0–15.0)
MCH: 27.2 pg (ref 26.0–34.0)
MCHC: 34.5 g/dL (ref 30.0–36.0)

## 2013-03-18 MED ORDER — ASPIRIN EC 325 MG PO TBEC: 325.0000 mg | DELAYED_RELEASE_TABLET | Freq: Two times a day (BID) | ORAL | Status: DC

## 2013-03-18 MED ORDER — CELECOXIB 200 MG PO CAPS: 200.0000 mg | ORAL_CAPSULE | Freq: Two times a day (BID) | ORAL | Status: DC

## 2013-03-18 MED ORDER — DOCUSATE SODIUM 100 MG PO CAPS: 100.0000 mg | ORAL_CAPSULE | Freq: Two times a day (BID) | ORAL | Status: DC

## 2013-03-18 MED ORDER — METHOCARBAMOL 500 MG PO TABS: 500.0000 mg | ORAL_TABLET | Freq: Four times a day (QID) | ORAL | Status: DC

## 2013-03-18 MED ORDER — HYDROCODONE-ACETAMINOPHEN 7.5-325 MG PO TABS: 1.0000 | ORAL_TABLET | Freq: Four times a day (QID) | ORAL | Status: DC

## 2013-03-18 NOTE — Progress Notes (Signed)
Physical Therapy Treatment Patient Details Name: Kathy Barr MRN: [id number] DOB: 08/16/1956 Today's Date: 03/18/2013 Time: 4098-1191 PT Time Calculation (min): 23 min  PT Assessment / Plan / Recommendation Comments on Treatment Session  pt presents with R THA.  pt moving great.  pt ready to D/C from PT stand point based on goals set on eval.  Patient required verbal cues for safe techniuqe to sit/stand and step up stairs.      Follow Up Recommendations  Home health PT;Supervision/Assistance - 24 hour     Does the patient have the potential to tolerate intense rehabilitation     Barriers to Discharge        Equipment Recommendations  None recommended by PT    Recommendations for Other Services    Frequency 7X/week   Plan Discharge plan remains appropriate;Frequency remains appropriate    Precautions / Restrictions Precautions Precautions: Posterior Hip;Fall Precaution Booklet Issued: No Precaution Comments: Patient verbalized 2/3 hip precautions at the beginning of treatment.  Patient verbalized 3/3 hip precautions at the end of treatment. Restrictions Weight Bearing Restrictions: Yes RLE Weight Bearing: Weight bearing as tolerated   Pertinent Vitals/Pain 7/10 Pain R Hip.    Mobility  Transfers Transfers: Stand to Sit Sit to Stand: 6: Modified independent (Device/Increase time);With upper extremity assist;From bed Stand to Sit: 6: Modified independent (Device/Increase time);With upper extremity assist;To chair/3-in-1 Details for Transfer Assistance: VC to kick R LE out.  VC for hand placment. Ambulation/Gait Ambulation/Gait Assistance: 5: Supervision Ambulation Distance (Feet): 150 Feet Assistive device: Rolling walker Ambulation/Gait Assistance Details: Cue to continually roll walker.  Look ahead.   Gait Pattern: Decreased stride length;Step-through pattern Stairs: Yes Stairs Assistance: 4: Min guard Stairs Assistance Details (indicate cue type and reason): Cues  for techniuqe. One step to enter. Practiced several times Stair Management Technique: Forwards;No rails;With walker Number of Stairs: 6 Wheelchair Mobility Wheelchair Mobility: No    Exercises Total Joint Exercises Quad Sets: AROM;Right;15 reps;Seated Gluteal Sets: AROM;Seated;Both;15 reps Hip ABduction/ADduction: AAROM;Right;15 reps Long Arc Quad: AROM;Right;15 reps   PT Diagnosis:    PT Problem List:   PT Treatment Interventions:     PT Goals Acute Rehab PT Goals PT Goal: Sit to Stand - Progress: Progressing toward goal PT Goal: Ambulate - Progress: Progressing toward goal Additional Goals PT Goal: Additional Goal #1 - Progress: Progressing toward goal  Visit Information  Last PT Received On: 03/18/13 Assistance Needed: +1    Subjective Data      Cognition  Cognition Overall Cognitive Status: Appears within functional limits for tasks assessed/performed Arousal/Alertness: Awake/alert Orientation Level: Oriented X4 / Intact Behavior During Session: [facility] for tasks performed    Balance     End of Session PT - End of Session Equipment Utilized During Treatment: Gait belt Activity Tolerance: Patient tolerated treatment well Patient left: in chair;with call bell/phone within reach   GP     [facility], MARY JEAN SPTA 03/18/2013, 9:10 AM 03/18/2013 Seen and agreed with above note Fredrich Birks PTA [phone] pager [phone] office

## 2013-03-18 NOTE — Progress Notes (Signed)
Patient ID: Kathy Barr, female   DOB: 1956/02/27, 57 y.o.   MRN: [id number] Subjective: 2 Days Post-Op Procedure(s) (LRB): RIGHT TOTAL HIP ARTHROPLASTY- right  (Right) Awake, alert and Ox4. No complaints, mild right shoulder ache and pain to right elbow. Patient reports pain as mild.    Objective:   VITALS:  Temp:  [97.8 F (36.6 C)-98.7 F (37.1 C)] 98.7 F (37.1 C) (04/10 0559) Pulse Rate:  [80-94] 88 (04/10 0559) Resp:  [16-18] 18 (04/10 0559) BP: (111-130)/(64-92) 130/92 mmHg (04/10 0559) SpO2:  [96 %-98 %] 98 % (04/10 0559)  Neurologically intact ABD soft Intact pulses distally Incision: dressing C/D/I and no drainage   LABS  Recent Labs  03/17/13 0625 03/18/13 0620  HGB 11.1* 10.0*  WBC 13.2* 11.3*  PLT 214 190    Recent Labs  03/17/13 0625  NA 138  K 3.6  CL 104  CO2 25  BUN 6  CREATININE 0.59  GLUCOSE 183*   No results found for this basename: LABPT, INR,  in the last 72 hours   Assessment/Plan: 2 Days Post-Op Procedure(s) (LRB): RIGHT TOTAL HIP ARTHROPLASTY- right  (Right)  Advance diet Up with therapy Discharge home with home health  NITKA,JAMES E 03/18/2013, 8:01 AM

## 2013-03-22 NOTE — Discharge Summary (Signed)
Physician Discharge Summary  Patient ID: Kathy Barr MRN: [id number] DOB/AGE: [birth year] 57 y.o.  Admit date: 03/16/2013 Discharge date: 03/18/2013  Admission Diagnoses:  Principal Problem:   Osteoarthritis of right hip   Discharge Diagnoses:  Same  Past Medical History  Diagnosis Date  . Hypertension   . Fracture 2013    RLE  . GERD (gastroesophageal reflux disease)     OTC  . Shortness of breath     "not since I quit smoking" (03/18/2013)  . Carpal tunnel syndrome     "right" (03/18/2013)  . Seizures 1960's; 1978    "when I was little; when I was 7 months pregnant" (03/18/2013)  . Arthritis     "all over" (03/18/2013)  . Osteoarthritis of left hip     Surgeries: Procedure(s): RIGHT TOTAL HIP ARTHROPLASTY- right  on 03/16/2013   Consultants:    Discharged Condition: Improved  Hospital Course: Kathy Barr is an 57 y.o. female who was admitted 03/16/2013 with a chief complaint of No chief complaint on file. , and found to have a diagnosis of Osteoarthritis of right hip.  They were brought to the operating room on 03/16/2013 and underwent the above named procedures.    They were given perioperative antibiotics:  Anti-infectives   Start     Dose/Rate Route Frequency Ordered Stop   03/16/13 0600  ceFAZolin (ANCEF) IVPB 2 g/50 mL premix     2 g 100 mL/hr over 30 Minutes Intravenous On call to O.R. 03/15/13 1504 03/16/13 0803    .  They were given sequential compression devices, early ambulation, and chemoprophylaxis for DVT prophylaxis. Post operatively she was given intermittant IV narcotics and oral narcotics. No drain or hemovac. The foley catheter Discontinued on POD#1 and PT and OT started. Voided without difficulty. POD #2 dressing changed and incision  Dry without erythrema. Did well with PT and OT and was discharged home to continue with [facility] for PT and OT. Xarelto was started POD#1 and she received two daily doses. Discharged home on aspirin 325 mg BID for anti  DVT Prophylaxis. Hgb stable at 10 post op. They benefited maximally from their hospital stay and there were no complications.    Recent vital signs:  Filed Vitals:   03/18/13 0559  BP: 130/92  Pulse: 88  Temp: 98.7 F (37.1 C)  Resp: 18    Recent laboratory studies:  Results for orders placed during the hospital encounter of 03/16/13  CBC      Result Value Range   WBC 13.2 (*) 4.0 - 10.5 K/uL   RBC 4.08  3.87 - 5.11 MIL/uL   Hemoglobin 11.1 (*) 12.0 - 15.0 g/dL   HCT 62.1 (*) 30.8 - 65.7 %   MCV 77.7 (*) 78.0 - 100.0 fL   MCH 27.2  26.0 - 34.0 pg   MCHC 35.0  30.0 - 36.0 g/dL   RDW 84.6  96.2 - 95.2 %   Platelets 214  150 - 400 K/uL  BASIC METABOLIC PANEL      Result Value Range   Sodium 138  135 - 145 mEq/L   Potassium 3.6  3.5 - 5.1 mEq/L   Chloride 104  96 - 112 mEq/L   CO2 25  19 - 32 mEq/L   Glucose, Bld 183 (*) 70 - 99 mg/dL   BUN 6  6 - 23 mg/dL   Creatinine, Ser 8.41  0.50 - 1.10 mg/dL   Calcium 8.8  8.4 - 32.4 mg/dL  GFR calc non Af Amer >90  >90 mL/min   GFR calc Af Amer >90  >90 mL/min  CBC      Result Value Range   WBC 11.3 (*) 4.0 - 10.5 K/uL   RBC 3.67 (*) 3.87 - 5.11 MIL/uL   Hemoglobin 10.0 (*) 12.0 - 15.0 g/dL   HCT 40.9 (*) 81.1 - 91.4 %   MCV 79.0  78.0 - 100.0 fL   MCH 27.2  26.0 - 34.0 pg   MCHC 34.5  30.0 - 36.0 g/dL   RDW 78.2  95.6 - 21.3 %   Platelets 190  150 - 400 K/uL    Discharge Medications:     Medication List    TAKE these medications       albuterol 108 (90 BASE) MCG/ACT inhaler  Commonly known as:  PROVENTIL HFA;VENTOLIN HFA  Inhale 2 puffs into the lungs every 6 (six) hours as needed for wheezing.     albuterol 108 (90 BASE) MCG/ACT inhaler  Commonly known as:  PROAIR HFA  Inhale 2 puffs into the lungs every 6 (six) hours as needed for wheezing.     aspirin EC 325 MG tablet  Take 1 tablet (325 mg total) by mouth 2 (two) times daily after a meal.     celecoxib 200 MG capsule  Commonly known as:  CELEBREX  Take 1  capsule (200 mg total) by mouth every 12 (twelve) hours.     cholecalciferol 400 UNITS Tabs  Commonly known as:  VITAMIN D  Take 400 Units by mouth daily.     docusate sodium 100 MG capsule  Commonly known as:  COLACE  Take 1 capsule (100 mg total) by mouth 2 (two) times daily.     HYDROcodone-acetaminophen 7.5-325 MG per tablet  Commonly known as:  NORCO  Take 1 tablet by mouth every 6 (six) hours.     methocarbamol 500 MG tablet  Commonly known as:  ROBAXIN  Take 1 tablet (500 mg total) by mouth 4 (four) times daily.     predniSONE 10 MG tablet  Commonly known as:  DELTASONE  Take 40 x 5 days     valsartan-hydrochlorothiazide 160-25 MG per tablet  Commonly known as:  DIOVAN-HCT  Take 1 tablet by mouth daily before breakfast.        Diagnostic Studies: Dg Pelvis Portable  03/16/2013  *RADIOLOGY REPORT*  Clinical Data: Postop  PORTABLE PELVIS  Comparison: Pelvis radiograph 06/30/2012  Findings: There are immediate postoperative changes of right total hip arthroplasty.  No periprosthetic fracture or hardware complication is identified.  A preexisting left total hip arthroplasty appears stable.  Expected locules of gas are seen in the soft tissues surrounding the right hip.  The imaged portion the bony pelvis appears intact.  IMPRESSION: There are immediate postoperative changes of right hip arthroplasty.  No complicating features identified.   Original Report Authenticated By: Britta Mccreedy, M.D.    Dg Hip Portable 1 View Right  03/16/2013  *RADIOLOGY REPORT*  Clinical Data: Postoperative lateral evaluation  PORTABLE RIGHT HIP - 1 VIEW  Comparison: AP image of same date.  Findings: Right hip arthroplasty has been performed.  Lateral image was obtained.  There is expected relationship between the femoral acetabular components. No disruption of hardware is evident.  IMPRESSION: Post right hip arthroplasty.   Original Report Authenticated By: Onalee Hua Call     Disposition: 06-Home-Health  Care Svc      Discharge Orders   Future Orders Complete By  Expires     Call MD / Call 911  As directed     Comments:      If you experience chest pain or shortness of breath, CALL 911 and be transported to the hospital emergency room.  If you develope a fever above 101 F, pus (white drainage) or increased drainage or redness at the wound, or calf pain, call your surgeon's office.    Change dressing  As directed     Comments:      You may change your dressing on 03/20/2012, then change the dressing daily with sterile 4 x 4 inch gauze dressing and paper tape.  You may clean the incision with alcohol prior to redressing    Constipation Prevention  As directed     Comments:      Drink plenty of fluids.  Prune juice may be helpful.  You may use a stool softener, such as Colace (over the counter) 100 mg twice a day.  Use MiraLax (over the counter) for constipation as needed.    Diet - low sodium heart healthy  As directed     Discharge instructions  As directed     Comments:      Keep hip incision dry for 5 days post op then may wet while bathing. Therapy daily . Call if fever or chills or increased drainage. Go to ER if acutely short of breath or call for ambulance. Return for follow up in 2 weeks. May full weight bear on the surgical leg unless told otherwise. In house walking for first 2 weeks.    Driving restrictions  As directed     Comments:      No driving for 4 weeks    Follow the hip precautions as taught in Physical Therapy  As directed     Increase activity slowly as tolerated  As directed     Lifting restrictions  As directed     Comments:      No lifting for 6 weeks    Weight bearing as tolerated  As directed        Follow-up Information   Follow up with Hance Caspers E, MD In 2 weeks.   Contact information:   [address] [place] [address] [phone]        Signed: Kerrin Champagne 03/22/2013, 7:43 AM

## 2013-04-05 ENCOUNTER — Other Ambulatory Visit: Payer: Self-pay | Admitting: Family Medicine

## 2013-04-05 NOTE — Progress Notes (Signed)
Late Entry: PT is recommending home with HH and not SNF. Clinical Social Worker will sign off for now as social work intervention is no longer needed. Please consult us again if new need arises.   Garald Rhew, MSW [phone] 

## 2013-04-27 ENCOUNTER — Ambulatory Visit (INDEPENDENT_AMBULATORY_CARE_PROVIDER_SITE_OTHER): Payer: Medicare Other | Admitting: Family Medicine

## 2013-04-27 ENCOUNTER — Encounter: Payer: Self-pay | Admitting: Family Medicine

## 2013-04-27 VITALS — BP 130/80 | HR 100 | Resp 18 | Wt 206.0 lb

## 2013-04-27 DIAGNOSIS — E669 Obesity, unspecified: Secondary | ICD-10-CM

## 2013-04-27 DIAGNOSIS — F17200 Nicotine dependence, unspecified, uncomplicated: Secondary | ICD-10-CM

## 2013-04-27 DIAGNOSIS — M159 Polyosteoarthritis, unspecified: Secondary | ICD-10-CM

## 2013-04-27 DIAGNOSIS — R21 Rash and other nonspecific skin eruption: Secondary | ICD-10-CM

## 2013-04-27 DIAGNOSIS — I10 Essential (primary) hypertension: Secondary | ICD-10-CM

## 2013-04-27 DIAGNOSIS — Z720 Tobacco use: Secondary | ICD-10-CM

## 2013-04-27 MED ORDER — PERMETHRIN 5 % EX CREA: TOPICAL_CREAM | CUTANEOUS | Status: DC

## 2013-04-27 MED ORDER — VALSARTAN-HYDROCHLOROTHIAZIDE 160-25 MG PO TABS: ORAL_TABLET | ORAL | Status: DC

## 2013-04-27 MED ORDER — PREDNISONE 10 MG PO TABS: ORAL_TABLET | ORAL | Status: DC

## 2013-04-27 MED ORDER — METHYLPREDNISOLONE ACETATE 40 MG/ML IJ SUSP
40.0000 mg | Freq: Once | INTRAMUSCULAR | Status: AC
  Administered 2013-04-27: 40 mg via INTRAMUSCULAR

## 2013-04-27 NOTE — Assessment & Plan Note (Signed)
Well controlled 

## 2013-04-27 NOTE — Progress Notes (Signed)
  Subjective:    Patient ID: Kathy Barr, female    DOB: 05-21-1956, 57 y.o.   MRN: [id number]  HPI  Patient here to followup chronic medical problems. She also complains of recurrent rash. She was treated back in January with permethrin this did resolve her symptoms she states after she returned from the hospital in April she noticed bright on her legs and her hands they have been spreading she also has itching all over. Her husband only had a few spots on his hand that went away with cocoa butter use. She denies any exposure outside denies any exposure to poison oak no change in her soap or detergent. The itching keeps her up at night. She still being followed by orthopedics for her recent hip surgery  Review of Systems     GEN- denies fatigue, fever, weight loss,weakness, recent illness HEENT- denies eye drainage, change in vision, nasal discharge, CVS- denies chest pain, palpitations RESP- denies SOB, cough, wheeze ABD- denies N/V, change in stools, abd pain GU- denies dysuria, hematuria, dribbling, incontinence MSK- denies joint pain, muscle aches, injury Neuro- denies headache, dizziness, syncope, seizure activity      Objective:   Physical Exam  GEN- NAD, alert and oriented x3 HEENT- PERRL, EOMI, non injected sclera, pink conjunctiva, MMM, oropharynx clear CVS- RRR, no murmur RESP-CTAB EXT- No edema Pulses- Radial, DP- 2+ Skin- multiple scattered erythematous bite appearing lesions on legs, maculopapular lesions on abdomen and back, buttocks, excoriations with scabs between fingers, no pustules, no oral lesions        Assessment & Plan:

## 2013-04-27 NOTE — Patient Instructions (Signed)
Continue your current medications No inhalers needed right now Use the cream as before Wash everything in hot water Start prednisone pills tomorrow Try the benadryl F/U 4 months- [employer]

## 2013-04-27 NOTE — Addendum Note (Signed)
Addended by: Kandis Fantasia B on: 04/27/2013 05:06 PM   Modules accepted: Orders

## 2013-04-27 NOTE — Assessment & Plan Note (Signed)
She is recovering quite well from her previous surgery she is still on Celebrex for arthritis and uses Norco for severe pain given by her surgeon

## 2013-04-27 NOTE — Assessment & Plan Note (Signed)
She has been very sedentary secondary to healing from hip surgery she is now starting to get back to activity

## 2013-04-27 NOTE — Assessment & Plan Note (Signed)
Recurrent generalized rash which looks mostly like bites don't have her used to permethrin again she was given IM steroids in the office and will use  PO steroids as well as Benadryl

## 2013-04-27 NOTE — Assessment & Plan Note (Signed)
Continues to cut back on her smoking

## 2013-07-14 ENCOUNTER — Ambulatory Visit: Payer: Self-pay | Admitting: Family Medicine

## 2013-07-20 ENCOUNTER — Emergency Department (HOSPITAL_COMMUNITY)
Admission: EM | Admit: 2013-07-20 | Discharge: 2013-07-20 | Disposition: A | Discharge status: Home / Self Care | Payer: Medicare Other | Attending: Emergency Medicine | Admitting: Emergency Medicine

## 2013-07-20 ENCOUNTER — Encounter (HOSPITAL_COMMUNITY): Payer: Self-pay | Admitting: *Deleted

## 2013-07-20 DIAGNOSIS — M169 Osteoarthritis of hip, unspecified: Secondary | ICD-10-CM | POA: Insufficient documentation

## 2013-07-20 DIAGNOSIS — Z79899 Other long term (current) drug therapy: Secondary | ICD-10-CM | POA: Insufficient documentation

## 2013-07-20 DIAGNOSIS — Z8719 Personal history of other diseases of the digestive system: Secondary | ICD-10-CM | POA: Insufficient documentation

## 2013-07-20 DIAGNOSIS — Z8781 Personal history of (healed) traumatic fracture: Secondary | ICD-10-CM | POA: Insufficient documentation

## 2013-07-20 DIAGNOSIS — L03012 Cellulitis of left finger: Secondary | ICD-10-CM

## 2013-07-20 DIAGNOSIS — L03019 Cellulitis of unspecified finger: Secondary | ICD-10-CM | POA: Insufficient documentation

## 2013-07-20 DIAGNOSIS — M1610 Unilateral primary osteoarthritis, unspecified hip: Secondary | ICD-10-CM | POA: Insufficient documentation

## 2013-07-20 DIAGNOSIS — I10 Essential (primary) hypertension: Secondary | ICD-10-CM | POA: Insufficient documentation

## 2013-07-20 DIAGNOSIS — Z8669 Personal history of other diseases of the nervous system and sense organs: Secondary | ICD-10-CM | POA: Insufficient documentation

## 2013-07-20 DIAGNOSIS — Z87891 Personal history of nicotine dependence: Secondary | ICD-10-CM | POA: Insufficient documentation

## 2013-07-20 MED ORDER — SULFAMETHOXAZOLE-TRIMETHOPRIM 800-160 MG PO TABS: 1.0000 | ORAL_TABLET | Freq: Two times a day (BID) | ORAL | Status: DC

## 2013-07-20 MED ORDER — LIDOCAINE HCL (PF) 2 % IJ SOLN
INTRAMUSCULAR | Status: AC
  Filled 2013-07-20: qty 10

## 2013-07-20 NOTE — ED Notes (Signed)
Pt c/o left index finger pain, redness that started last night, denies any injury,

## 2013-07-20 NOTE — ED Provider Notes (Signed)
CSN: [id number]     Arrival date & time 07/20/13  1044 History     First MD Initiated Contact with Patient 07/20/13 1227     Chief Complaint  Patient presents with  . Hand Pain   (Consider location/radiation/quality/duration/timing/severity/associated sxs/prior Treatment) HPI Kathy Barr is a 57 y.o. female with pain in the left index finger for 2 days. Has been picking at it with a needle and made it worse. She complains of redness around the nail that started last night. She denies any other problems. The history was provided by the patient.  Past Medical History  Diagnosis Date  . Hypertension   . Fracture 2013    RLE  . GERD (gastroesophageal reflux disease)     OTC  . Shortness of breath     "not since I quit smoking" (03/18/2013)  . Carpal tunnel syndrome     "right" (03/18/2013)  . Seizures 1960's; 1978    "when I was little; when I was 7 months pregnant" (03/18/2013)  . Arthritis     "all over" (03/18/2013)  . Osteoarthritis of left hip    Past Surgical History  Procedure Laterality Date  . Foot arthrotomy Right 1990's    pin, after removing a bone   . Total hip arthroplasty  06/30/2012    Procedure: TOTAL HIP ARTHROPLASTY;  Surgeon: Kerrin Champagne, MD;  Location: [facility] OR;  Service: Orthopedics;  Laterality: Left;  Left total hip replacement with metal and polypropylene pore coated implants  . Joint replacement Left     hip  . Total hip arthroplasty Right 03/16/2013    Procedure: RIGHT TOTAL HIP ARTHROPLASTY- right ;  Surgeon: Kerrin Champagne, MD;  Location: [facility] OR;  Service: Orthopedics;  Laterality: Right;  . Cholecystectomy  2003  . Tubal ligation  1978  . Vaginal hysterectomy  ~ 2003  . Anterior cervical decomp/discectomy fusion  ?2003   Family History  Problem Relation Age of Onset  . Hypertension Father   . Diabetes Father   . Hypertension Sister   . Diabetes Sister   . Hypertension Brother   . Diabetes Brother    History  Substance Use Topics  . Smoking  status: Former Smoker -- 0.25 packs/day for 25 years    Types: Cigarettes    Quit date: 02/28/2013  . Smokeless tobacco: Never Used  . Alcohol Use: No   OB History   Grav Para Term Preterm Abortions TAB SAB Ect Mult Living                 Review of Systems  Constitutional: Negative for fever and chills.  HENT: Negative for neck pain.   Respiratory: Negative for shortness of breath.   Gastrointestinal: Negative for nausea and vomiting.  Musculoskeletal:       Pain left index finger  Skin: Positive for wound.  Psychiatric/Behavioral: The patient is not nervous/anxious.     Allergies  Review of patient's allergies indicates no known allergies.  Home Medications   Current Outpatient Rx  Name  Route  Sig  Dispense  Refill  . aspirin EC 325 MG tablet   Oral   Take 1 tablet (325 mg total) by mouth 2 (two) times daily after a meal.   100 tablet   0   . celecoxib (CELEBREX) 200 MG capsule   Oral   Take 1 capsule (200 mg total) by mouth every 12 (twelve) hours.   60 capsule   0   . cholecalciferol (VITAMIN  D) 400 UNITS TABS   Oral   Take 400 Units by mouth daily.         Marland Kitchen docusate sodium (COLACE) 100 MG capsule   Oral   Take 1 capsule (100 mg total) by mouth 2 (two) times daily.   100 capsule   0   . HYDROcodone-acetaminophen (NORCO) 7.5-325 MG per tablet   Oral   Take 1 tablet by mouth every 6 (six) hours.   60 tablet   1   . methocarbamol (ROBAXIN) 500 MG tablet   Oral   Take 1 tablet (500 mg total) by mouth 4 (four) times daily.   60 tablet   1   . permethrin (ELIMITE) 5 % cream      Apply from neck down , rinse in the morning, you may repeat in 2 weeks if needed   60 g   1   . predniSONE (DELTASONE) 10 MG tablet      Take 20mg  daily for 5 days   10 tablet   0   . valsartan-hydrochlorothiazide (DIOVAN-HCT) 160-25 MG per tablet      TAKE ONE TABLET BY MOUTH EVERY DAY BEFORE BREAKFAST   90 tablet   1    BP 116/76  Pulse 84  Temp(Src) 97.8  F (36.6 C)  Resp 20  Ht 5\' 4"  (1.626 m)  Wt 190 lb (86.183 kg)  BMI 32.6 kg/m2  SpO2 99% Physical Exam  Nursing note and vitals reviewed. Constitutional: She is oriented to person, place, and time. She appears well-developed and well-nourished. No distress.  HENT:  Head: Normocephalic.  Eyes: EOM are normal.  Neck: Normal range of motion. Neck supple.  Cardiovascular: Normal rate.   Pulmonary/Chest: Effort normal.  Abdominal: Soft.  Musculoskeletal:       Left hand: She exhibits tenderness. She exhibits normal range of motion, normal capillary refill, no deformity and no laceration. Swelling: minimal. Normal strength noted.       Hands: Small amount of erythema surrounding the nail of the left index finger. No pocket of infection noted.  Neurological: She is alert and oriented to person, place, and time. No cranial nerve deficit.  Skin: Skin is warm and dry.  Psychiatric: She has a normal mood and affect. Her behavior is normal.    ED Course   Procedures (  MDM   57 y.o. female with infection of left index finger, early paronychia. Will treat with antibiotics and patent will soak in warm water with antibacterial soap.    Medication List    TAKE these medications       sulfamethoxazole-trimethoprim 800-160 MG per tablet  Commonly known as:  SEPTRA DS  Take 1 tablet by mouth 2 (two) times daily.      ASK your doctor about these medications       aspirin EC 325 MG tablet  Take 1 tablet (325 mg total) by mouth 2 (two) times daily after a meal.     celecoxib 200 MG capsule  Commonly known as:  CELEBREX  Take 1 capsule (200 mg total) by mouth every 12 (twelve) hours.     cholecalciferol 400 UNITS Tabs tablet  Commonly known as:  VITAMIN D  Take 400 Units by mouth daily.     docusate sodium 100 MG capsule  Commonly known as:  COLACE  Take 1 capsule (100 mg total) by mouth 2 (two) times daily.     HYDROcodone-acetaminophen 7.5-325 MG per tablet  Commonly known as:  NORCO  Take 1 tablet by mouth every 6 (six) hours.     methocarbamol 500 MG tablet  Commonly known as:  ROBAXIN  Take 1 tablet (500 mg total) by mouth 4 (four) times daily.     permethrin 5 % cream  Commonly known as:  ELIMITE  Apply from neck down , rinse in the morning, you may repeat in 2 weeks if needed     predniSONE 10 MG tablet  Commonly known as:  DELTASONE  Take 20mg  daily for 5 days     valsartan-hydrochlorothiazide 160-25 MG per tablet  Commonly known as:  DIOVAN-HCT  TAKE ONE TABLET BY MOUTH EVERY DAY BEFORE BREAKFAST        Discussed with the patient if symptoms worsen to return to the ED for further evaluation.  [place], NP 07/20/13 [address] Orlene Och, NP 07/20/13 1757

## 2013-07-21 NOTE — ED Provider Notes (Signed)
Medical screening examination/treatment/procedure(s) were performed by non-physician practitioner and as supervising physician I was immediately available for consultation/collaboration.  Ivar Domangue, MD 07/21/13 0913 

## 2013-12-23 ENCOUNTER — Ambulatory Visit (HOSPITAL_COMMUNITY)
Admission: RE | Admit: 2013-12-23 | Discharge: 2013-12-23 | Disposition: A | Discharge status: Home / Self Care | Payer: Medicare Other | Source: Ambulatory Visit | Attending: Orthopaedic Surgery | Admitting: Orthopaedic Surgery

## 2013-12-23 DIAGNOSIS — IMO0001 Reserved for inherently not codable concepts without codable children: Secondary | ICD-10-CM | POA: Insufficient documentation

## 2013-12-23 DIAGNOSIS — Z981 Arthrodesis status: Secondary | ICD-10-CM | POA: Insufficient documentation

## 2013-12-23 DIAGNOSIS — M25519 Pain in unspecified shoulder: Secondary | ICD-10-CM | POA: Insufficient documentation

## 2013-12-23 DIAGNOSIS — M542 Cervicalgia: Secondary | ICD-10-CM | POA: Insufficient documentation

## 2013-12-23 DIAGNOSIS — I10 Essential (primary) hypertension: Secondary | ICD-10-CM | POA: Insufficient documentation

## 2013-12-23 NOTE — Evaluation (Signed)
Physical Therapy Evaluation  Patient Details  Name: Kathy Barr MRN: [id number] Date of Birth: December 16, 1955  Today's Date: 12/23/2013 Time: 1125-1200 PT Time Calculation (min): 35 min 1 evaluation    1125-1100             Visit#: 1 of 8  Re-eval:  01/24/14  Assessment Diagnosis: neck pain/ fusion  Surgical Date: 02/25/14 Next MD Visit: Dr Lorin Mercy   Authorization: Medicare [place] Time Period:    Authorization Visit#: 1 of 8   Past Medical History:  Past Medical History  Diagnosis Date  . Hypertension   . Fracture 2013    RLE  . GERD (gastroesophageal reflux disease)     OTC  . Shortness of breath     "not since I quit smoking" (03/18/2013)  . Carpal tunnel syndrome     "right" (03/18/2013)  . Seizures 1960's; 1978    "when I was little; when I was 7 months pregnant" (03/18/2013)  . Arthritis     "all over" (03/18/2013)  . Osteoarthritis of left hip    Past Surgical History:  Past Surgical History  Procedure Laterality Date  . Foot arthrotomy Right 1990's    pin, after removing a bone   . Total hip arthroplasty  06/30/2012    Procedure: TOTAL HIP ARTHROPLASTY;  Surgeon: Jessy Oto, MD;  Location: [facility];  Service: Orthopedics;  Laterality: Left;  Left total hip replacement with metal and polypropylene pore coated implants  . Joint replacement Left     hip  . Total hip arthroplasty Right 03/16/2013    Procedure: RIGHT TOTAL HIP ARTHROPLASTY- right ;  Surgeon: Jessy Oto, MD;  Location: [facility];  Service: Orthopedics;  Laterality: Right;  . Cholecystectomy  2003  . Tubal ligation  1978  . Vaginal hysterectomy  ~ 2003  . Anterior cervical decomp/discectomy fusion  ?2003    Subjective Symptoms/Limitations Symptoms: c/o right side neck pain, cervical fusion 2003 C 6-7, pain intermittent into right UE with  Intermittent tingling, patient anticipates neck surgery in near future , left handed , pain ongoing since 2003 , unknown cause of injury Pertinent History:  fused C 6-7, spondylosis, C4-6 , B hip replacements,  Limitations: Sitting;Lifting;House hold activities Patient Stated Goals: decrease pain  Pain Assessment Currently in Pain?: Yes Pain Score: 3  (at worst 7/10 ) Pain Location: Neck (right UE ) Pain Orientation: Right Pain Type: Chronic pain Pain Radiating Towards: right UE  Pain Onset: More than a month ago Pain Relieving Factors: moving around     Balance Screening Balance Screen Has the patient fallen in the past 6 months: No  Prior Functioning  Prior Function Level of Independence: Independent with basic ADLs, neck pain chronic  Vocation: On disability    Sensation/Coordination/Flexibility/Functional Tests Sensation Additional Comments: decreased sensation to sharp dull right hand  Functional Tests Functional Tests: FOTO 52   Assessment RUE AROM (degrees) RUE Overall AROM Comments: slow movements but range improved with reps  Right Shoulder Flexion: 130 Degrees Right Shoulder External Rotation: 40 Degrees RUE PROM (degrees) Right Shoulder Flexion: 150 Degrees RUE Strength RUE Overall Strength Comments: grossly 4/5  LUE AROM (degrees) Left Shoulder Flexion: 130 Degrees LUE Strength LUE Overall Strength Comments: grossly 4/5  Cervical AROM Overall Cervical AROM Comments: pain with SB right   (** B shoulder flexion 130 degrees ) Cervical Flexion: 25 Cervical Extension: 20 Cervical - Right Side Bend: 20 Cervical - Left Side Bend: 20 Cervical -  Right Rotation: 60 Cervical - Left Rotation: 40 Cervical PROM Overall Cervical PROM Comments: WNL,  Palpation Palpation: tender right upper and middle  trapezius,  R periscapular region , tender right biceps tendon insertion     Physical Therapy Assessment and Plan PT Assessment and Plan Clinical Impression Statement: 58 yr old female for for PT with med dx of neck pain , spondylosis, hx of C6-7 cervical fusion. Her neck pain has been chronic since 2003, with  intermittent right cerical /UE pain and parasthesia  since 2003/2004. She can benefit from trial of physical therapy to address impairments listed below. She has noted decreased functional Bilateral shoulder  AROM flexion impacting reaching for ADLS and self care. Without skilled rehab patient is at risk for increased pain levels in right cervical area and further loss of functional shoulder AROM .  Pt will benefit from skilled therapeutic intervention in order to improve on the following deficits: Pain cervical and R UE;Decreased range of motion cervical and B shoulders;Impaired UE functional use Rehab Potential: Fair Clinical Impairments Affecting Rehab Potential: chronic pain, hx of C6-7 fusion 2003, pateint anticipates cervical fusion revision per imaging and recent dr appt  PT Frequency: Min 2X/week PT Duration: 4 weeks PT Treatment/Interventions: Therapeutic activities;Therapeutic exercise;Neuromuscular re-education;Modalities;Patient/family education;Manual techniques PT Plan: gentle functional shoulder AROM/strength, cervical STM, cervical exercises,  heat, manual, modlaites prn for pain      Goals Home Exercise Program Pt/caregiver will Perform Home Exercise Program: Independently PT Goal: Perform Home Exercise Program - Progress: Goal set today PT Short Term Goals Time to Complete Short Term Goals: 2 weeks PT Short Term Goal 1: Improve cervical AROM rotation left to 50 degrees for ADLS, dressing , driving  PT Short Term Goal 2: To report decreased right UE and neck pain to 5/10 at worst  PT Long Term Goals Time to Complete Long Term Goals: 4 weeks PT Long Term Goal 1: TO reach behind  head without increased pain in neck and shoulders  PT Long Term Goal 2: Patient to report pain at wrost less than 4/10 during hygiene and household ADLS   Problem List Patient Active Problem List   Diagnosis Date Noted  . Rash and nonspecific skin eruption 04/27/2013  . Osteoarthritis of right hip  03/16/2013    Class: Chronic  . Generalized OA 10/29/2012  . Essential hypertension, benign 10/29/2012  . Obesity, unspecified 10/29/2012  . Chronic neck pain 10/29/2012  . Tobacco user 10/29/2012    General Behavior During Therapy: [facility] for tasks assessed/performed PT Plan of Care PT Home Exercise Plan: start next visit  Consulted and Agree with Plan of Care: Patient  GP Functional Assessment Tool Used: FOTO  Other PT Primary Current Status (H4174): At least 40 percent but less than 60 percent impaired, limited or restricted Other PT Primary Goal Status (Y8144): At least 40 percent but less than 60 percent impaired, limited or restricted  Jordin Vicencio 12/23/2013, 2:27 PM  Physician Documentation Your signature is required to indicate approval of the treatment plan as stated above.  Please sign and either send electronically or make a copy of this report for your files and return this physician signed original.   Please mark one 1.__approve of plan  2. ___approve of plan with the following conditions.   ______________________________  _____________________ Physician Signature                                                                                                             Date

## 2013-12-28 ENCOUNTER — Telehealth (HOSPITAL_COMMUNITY): Payer: Self-pay

## 2013-12-28 ENCOUNTER — Ambulatory Visit (HOSPITAL_COMMUNITY): Payer: Medicare Other | Admitting: Physical Therapy

## 2013-12-30 ENCOUNTER — Ambulatory Visit (HOSPITAL_COMMUNITY)
Admission: RE | Admit: 2013-12-30 | Discharge: 2013-12-30 | Disposition: A | Discharge status: Home / Self Care | Payer: Medicare Other | Source: Ambulatory Visit | Attending: Orthopaedic Surgery | Admitting: Orthopaedic Surgery

## 2013-12-30 DIAGNOSIS — G8929 Other chronic pain: Secondary | ICD-10-CM

## 2013-12-30 DIAGNOSIS — M542 Cervicalgia: Principal | ICD-10-CM

## 2013-12-30 NOTE — Progress Notes (Signed)
Physical Therapy Treatment Patient Details  Name: Kathy Barr MRN: [id number] Date of Birth: 1956-09-12  Today's Date: 12/30/2013 Time: 1300-1345 PT Time Calculation (min): 20 min 1300-1310 ultrasound  1310-1325 manual  [phone]  TE Visit#: 2 of 8  Re-eval:   Assessment Diagnosis: neck pain/ fusion  Surgical Date: 02/25/14 Next MD Visit: Dr Lorin Mercy   Authorization: Medicare [employer]   Authorization Time Period:    Authorization Visit#: 2 of 8   Subjective: Symptoms/Limitations Symptoms: doing ok, internittent right shoulder /UE pain, no pain at start of session   Precautions/Restrictions     Exercise/Treatments Mobility/Balance        Supine   Seated   Prone    Sidelying   Standing Other Standing Exercises: wall slides R 2 x 10  Pulleys Flexion: 2 minutes Therapy Ball   ROM / Strengthening / Isometric Strengthening UBE (Upper Arm Bike): 4 min retro  Wall Wash: 20x R    Stretches   Power Educational psychologist      Modalities Modalities: Ultrasound Manual Therapy Manual Therapy: Joint mobilization Joint Mobilization: right shoulder PROM all pl;anes, gentle GH mobs inferior, anterior, and posterior . manual x 15 min  Ultrasound Ultrasound Location: right shoulder anterior biceps tendon insertion 8 min 50% 1.2 w/cm3  Ultrasound Parameters: see above  Ultrasound Goals: Pain  Physical Therapy Assessment and Plan PT Assessment and Plan Clinical Impression Statement: improved right shoulder flexion arom from intial visit, fatigues quickly R UE with activities above shoulder level  Pt will benefit from skilled therapeutic intervention in order to improve on the following deficits: Pain;Decreased range of motion;Impaired UE functional use Rehab Potential: Fair Clinical Impairments Affecting Rehab Potential: chronic pain, hx of C6-7 fusion 2003, pateint anticipates cervical fusion revision per imaging and recent dr appt  PT Frequency: Min 2X/week PT Duration:  4 weeks PT Treatment/Interventions: Therapeutic activities;Therapeutic exercise;Neuromuscular re-education;Modalities;Patient/family education;Manual techniques PT Plan: gentle functional shoulder AROM/strength, heat, manual, modlaites prn for pain      Goals Home Exercise Program Pt/caregiver will Perform Home Exercise Program: Independently PT Goal: Perform Home Exercise Program - Progress: Progressing toward goal PT Short Term Goals Time to Complete Short Term Goals: 2 weeks PT Short Term Goal 1: Improve cervical AROM rotation left to 50 degrees for ADLS, dressing , driving  PT Short Term Goal 2: To report decreased right UE and neck pain to 5/10 at worst  PT Long Term Goals Time to Complete Long Term Goals: 4 weeks PT Long Term Goal 1: TO reach behind  head without increased pain in neck and shoulders  PT Long Term Goal 2: Patient to report pain at wrost less than 4/10 during hygiene and household ADLS     General Behavior During Therapy: [facility] for tasks assessed/performed PT Plan of Care PT Patient Instructions: wall slides and wall washes  Consulted and Agree with Plan of Care: Patient   Sarajane Marek 12/30/2013, 3:41 PM

## 2014-01-04 ENCOUNTER — Ambulatory Visit (HOSPITAL_COMMUNITY): Payer: Medicare Other | Admitting: *Deleted

## 2014-01-06 ENCOUNTER — Ambulatory Visit (HOSPITAL_COMMUNITY): Payer: Medicare Other

## 2014-01-11 ENCOUNTER — Ambulatory Visit (HOSPITAL_COMMUNITY)
Admission: RE | Admit: 2014-01-11 | Discharge: 2014-01-11 | Disposition: A | Discharge status: Home / Self Care | Payer: Medicare Other | Source: Ambulatory Visit | Attending: Family Medicine | Admitting: Family Medicine

## 2014-01-11 DIAGNOSIS — IMO0001 Reserved for inherently not codable concepts without codable children: Secondary | ICD-10-CM | POA: Insufficient documentation

## 2014-01-11 DIAGNOSIS — I10 Essential (primary) hypertension: Secondary | ICD-10-CM | POA: Insufficient documentation

## 2014-01-11 DIAGNOSIS — G8929 Other chronic pain: Secondary | ICD-10-CM

## 2014-01-11 DIAGNOSIS — Z981 Arthrodesis status: Secondary | ICD-10-CM | POA: Insufficient documentation

## 2014-01-11 DIAGNOSIS — M25519 Pain in unspecified shoulder: Secondary | ICD-10-CM | POA: Insufficient documentation

## 2014-01-11 DIAGNOSIS — M542 Cervicalgia: Secondary | ICD-10-CM | POA: Insufficient documentation

## 2014-01-11 NOTE — Progress Notes (Signed)
Physical Therapy Treatment Patient Details  Name: Kathy Barr MRN: [id number] Date of Birth: 04/23/1956  Today's Date: 01/11/2014 Time: 6160-7371 PT Time Calculation (min): 40 min Charge: TE [id number], [place] 0952-1000, Manual 1000-1015  Visit#: 3 of 8  Re-eval: 01/24/14 Assessment Diagnosis: neck pain/ fusion  Surgical Date: 02/25/14 Next MD Visit: Dr Lorin Mercy 01/12/2014  Authorization: Medicare [employer]   Authorization Time Period:    Authorization Visit#: 3 of 8   Subjective: Symptoms/Limitations Symptoms: Doing ok today, pain scale 6/10.  Most difficulty with functional tasks like vacuuming and standing for long periods of time  Pain Assessment Currently in Pain?: Yes Pain Score: 6  Pain Location: Shoulder Pain Orientation: Right  Objective:   Exercise/Treatments Standing Other Standing Exercises: wall slides R 2 x 10  Pulleys Flexion: 2 minutes ABduction: 2 minutes ROM / Strengthening / Isometric Strengthening UBE (Upper Arm Bike): 6 min backwards    Modalities Modalities: Ultrasound Manual Therapy Manual Therapy: Joint mobilization Joint Mobilization: right shoulder PROM all pl; anes, gentle GH mobs inferior, anterior, and posterior; scapular mobs . manual x 15 min  Ultrasound Ultrasound Location: right shoulder anterior biceps tendon insertion 8 min 50% 1.2 w/cm3 Ultrasound Goals: Pain  Physical Therapy Assessment and Plan PT Assessment and Plan Clinical Impression Statement: Improved Rt shoulder ROM following manual techniques to reduce fascial restricitons, scaoular and GH mobs.  Pt required cueing to relax upper trapezius with therex this sesion. PT Plan: gentle fuiunctional shoulder AROM/strength, heat, manual, modlaites prn for pain      Goals Home Exercise Program Pt/caregiver will Perform Home Exercise Program: Independently PT Short Term Goals Time to Complete Short Term Goals: 2 weeks PT Short Term Goal 1: Improve cervical AROM rotation left to 50  degrees for ADLS, dressing , driving  PT Short Term Goal 2: To report decreased right UE and neck pain to 5/10 at worst  PT Short Term Goal 2 - Progress: Progressing toward goal PT Long Term Goals Time to Complete Long Term Goals: 4 weeks PT Long Term Goal 1: TO reach behind  head without increased pain in neck and shoulders  PT Long Term Goal 2: Patient to report pain at wrost less than 4/10 during hygiene and household ADLS   Problem List Patient Active Problem List   Diagnosis Date Noted  . Rash and nonspecific skin eruption 04/27/2013  . Osteoarthritis of right hip 03/16/2013    Class: Chronic  . Generalized OA 10/29/2012  . Essential hypertension, benign 10/29/2012  . Obesity, unspecified 10/29/2012  . Chronic neck pain 10/29/2012  . Tobacco user 10/29/2012    PT - End of Session Activity Tolerance: Patient tolerated treatment well General Behavior During Therapy: [facility] for tasks assessed/performed  GP    Aldona Lento 01/11/2014, 10:28 AM

## 2014-01-12 ENCOUNTER — Ambulatory Visit (INDEPENDENT_AMBULATORY_CARE_PROVIDER_SITE_OTHER): Payer: Medicare Other | Admitting: Family Medicine

## 2014-01-12 ENCOUNTER — Encounter: Payer: Self-pay | Admitting: Family Medicine

## 2014-01-12 VITALS — BP 110/80 | HR 78 | Temp 97.7°F | Resp 18 | Ht 62.0 in | Wt 204.0 lb

## 2014-01-12 DIAGNOSIS — R194 Change in bowel habit: Secondary | ICD-10-CM

## 2014-01-12 DIAGNOSIS — Z1231 Encounter for screening mammogram for malignant neoplasm of breast: Secondary | ICD-10-CM

## 2014-01-12 DIAGNOSIS — I10 Essential (primary) hypertension: Secondary | ICD-10-CM

## 2014-01-12 DIAGNOSIS — E669 Obesity, unspecified: Secondary | ICD-10-CM

## 2014-01-12 DIAGNOSIS — Z1211 Encounter for screening for malignant neoplasm of colon: Secondary | ICD-10-CM

## 2014-01-12 DIAGNOSIS — R198 Other specified symptoms and signs involving the digestive system and abdomen: Secondary | ICD-10-CM

## 2014-01-12 DIAGNOSIS — M159 Polyosteoarthritis, unspecified: Secondary | ICD-10-CM

## 2014-01-12 LAB — CBC WITH DIFFERENTIAL/PLATELET
Basophils Absolute: 0 10*3/uL (ref 0.0–0.1)
Basophils Relative: 0 % (ref 0–1)
EOS ABS: 0.2 10*3/uL (ref 0.0–0.7)
EOS PCT: 3 % (ref 0–5)
HCT: 40.8 % (ref 36.0–46.0)
HEMOGLOBIN: 13.6 g/dL (ref 12.0–15.0)
LYMPHS ABS: 1.4 10*3/uL (ref 0.7–4.0)
Lymphocytes Relative: 21 % (ref 12–46)
MCH: 27.3 pg (ref 26.0–34.0)
MCHC: 33.3 g/dL (ref 30.0–36.0)
MCV: 81.8 fL (ref 78.0–100.0)
MONOS PCT: 8 % (ref 3–12)
Monocytes Absolute: 0.5 10*3/uL (ref 0.1–1.0)
Neutro Abs: 4.5 10*3/uL (ref 1.7–7.7)
Neutrophils Relative %: 68 % (ref 43–77)
Platelets: 216 10*3/uL (ref 150–400)
RBC: 4.99 MIL/uL (ref 3.87–5.11)
RDW: 14.2 % (ref 11.5–15.5)
WBC: 6.7 10*3/uL (ref 4.0–10.5)

## 2014-01-12 LAB — BASIC METABOLIC PANEL
BUN: 9 mg/dL (ref 6–23)
CALCIUM: 9.3 mg/dL (ref 8.4–10.5)
CO2: 23 mEq/L (ref 19–32)
Chloride: 102 mEq/L (ref 96–112)
Creat: 0.62 mg/dL (ref 0.50–1.10)
GLUCOSE: 91 mg/dL (ref 70–99)
Potassium: 3.6 mEq/L (ref 3.5–5.3)
SODIUM: 139 meq/L (ref 135–145)

## 2014-01-12 LAB — LIPID PANEL
CHOL/HDL RATIO: 3.5 ratio
CHOLESTEROL: 166 mg/dL (ref 0–200)
HDL: 48 mg/dL (ref >39)
LDL Cholesterol: 87 mg/dL (ref 0–99)
TRIGLYCERIDES: 155 mg/dL — AB (ref <150)
VLDL: 31 mg/dL (ref 0–40)

## 2014-01-12 MED ORDER — VALSARTAN-HYDROCHLOROTHIAZIDE 160-25 MG PO TABS: ORAL_TABLET | ORAL | Status: DC

## 2014-01-12 MED ORDER — CELECOXIB 200 MG PO CAPS: 200.0000 mg | ORAL_CAPSULE | Freq: Two times a day (BID) | ORAL | Status: DC

## 2014-01-12 NOTE — Assessment & Plan Note (Signed)
Continue celebrex F/U neurosurgery regarding the PT for the neck and possible surgery

## 2014-01-12 NOTE — Progress Notes (Signed)
Patient ID: Kathy Barr, female   DOB: 1956/05/13, 58 y.o.   MRN: [id number]   Subjective:    Patient ID: Kathy Barr, female    DOB: 05-Mar-1956, 58 y.o.   MRN: [id number]  Patient presents for 6 mos follow up  HTN- tolerating meds without difficulty no concerns  Chronic neck pain/OA- followed by neurosurgery currently in PT, ? If she needs another fusion done due to radicular symptoms and pain she has f/u tomorrow  Bowel changes- has not had colonoscopy. For many months has had soft poorly formed stools and gas on and off, worse depending on types of foods she eats which is typically cheesy foods, or fried foods, no abd pain. Occ gets constipated as well.    Review Of Systems:  GEN- denies fatigue, fever, weight loss,weakness, recent illness HEENT- denies eye drainage, change in vision, nasal discharge, CVS- denies chest pain, palpitations RESP- denies SOB, cough, wheeze ABD- denies N/V,+ change in stools, abd pain GU- denies dysuria, hematuria, dribbling, incontinence MSK- denies joint pain, muscle aches, injury Neuro- denies headache, dizziness, syncope, seizure activity       Objective:    BP 110/80  Pulse 78  Temp(Src) 97.7 F (36.5 C) (Oral)  Resp 18  Ht 5\' 2"  (1.575 m)  Wt 204 lb (92.534 kg)  BMI 37.30 kg/m2 GEN- NAD, alert and oriented x3 HEENT- PERRL, EOMI, non injected sclera, pink conjunctiva, MMM, oropharynx clear CVS- RRR, no murmur RESP-CTAB ABD-NABS,soft,NT,ND EXT- No edema Pulses- Radial, DP- 2+        Assessment & Plan:      Problem List Items Addressed This Visit   Generalized OA     Continue celebrex F/U neurosurgery regarding the PT for the neck and possible surgery    Relevant Medications      celecoxib (CELEBREX) capsule   Essential hypertension, benign - Primary     Well controlled    Relevant Medications      valsartan-hydrochlorothiazide (DIOVAN-HCT) 160-25 MG per tablet   Other Relevant Orders      Basic metabolic panel   Lipid panel      CBC with Differential   Bowel habit changes     Refer for colonoscocpy I think she has some IBS as well, mostly due to dietary effects- high fat diet, greasy foods       Note: This dictation was prepared with Dragon dictation along with smaller phrase technology. Any transcriptional errors that result from this process are unintentional.

## 2014-01-12 NOTE — Assessment & Plan Note (Signed)
Well controlled 

## 2014-01-12 NOTE — Assessment & Plan Note (Signed)
Refer for colonoscocpy I think she has some IBS as well, mostly due to dietary effects- high fat diet, greasy foods

## 2014-01-12 NOTE — Patient Instructions (Signed)
Continue current medications Mammogram to be scheduled Avoid the fried foods and cheesy foods, decrease the dairy Referral Colonoscopy  F/U 6 months

## 2014-01-13 ENCOUNTER — Inpatient Hospital Stay (HOSPITAL_COMMUNITY): Admission: RE | Admit: 2014-01-13 | Payer: Medicare Other | Source: Ambulatory Visit

## 2014-01-13 NOTE — Assessment & Plan Note (Signed)
Discussed dietary changes and activity

## 2014-01-14 ENCOUNTER — Encounter: Payer: Self-pay | Admitting: *Deleted

## 2014-01-18 ENCOUNTER — Encounter (INDEPENDENT_AMBULATORY_CARE_PROVIDER_SITE_OTHER): Payer: Self-pay | Admitting: *Deleted

## 2014-01-18 ENCOUNTER — Ambulatory Visit (HOSPITAL_COMMUNITY)
Admission: RE | Admit: 2014-01-18 | Discharge: 2014-01-18 | Disposition: A | Discharge status: Home / Self Care | Payer: PRIVATE HEALTH INSURANCE | Source: Ambulatory Visit | Attending: Family Medicine | Admitting: Family Medicine

## 2014-01-18 DIAGNOSIS — Z1231 Encounter for screening mammogram for malignant neoplasm of breast: Secondary | ICD-10-CM

## 2014-01-19 ENCOUNTER — Ambulatory Visit (HOSPITAL_COMMUNITY)
Admission: RE | Admit: 2014-01-19 | Discharge: 2014-01-19 | Disposition: A | Discharge status: Home / Self Care | Payer: Medicare Other | Source: Ambulatory Visit | Attending: Family Medicine | Admitting: Family Medicine

## 2014-01-19 DIAGNOSIS — M542 Cervicalgia: Principal | ICD-10-CM

## 2014-01-19 DIAGNOSIS — G8929 Other chronic pain: Secondary | ICD-10-CM

## 2014-01-19 NOTE — Progress Notes (Signed)
Physical Therapy Treatment Patient Details  Name: Kathy Barr MRN: [id number] Date of Birth: 06/02/1956  Today's Date: 01/19/2014 Time: 0930-1015 PT Time Calculation (min): 45 min 0930 -0940  Ultrasound  0940 - 1015  Manual  Visit#: 4 of 8  Re-eval: 01/24/14 Assessment Diagnosis: neck pain/ fusion  Surgical Date: 02/25/14 Next MD Visit: Dr Lorin Mercy 01/12/2014  Authorization: Medicare [employer]   Authorization Time Period:    Authorization Visit#: 4 of 8   Subjective: Symptoms/Limitations Symptoms: intermittent right shoulder and neck pain, improved overall, tingling in right UE contyinues, worse with driving  Pertinent History: fused C 6-7, spondylosis, C4-6 , B hip replacements,  Pain Assessment Currently in Pain?: Yes Pain Score: 2  Pain Location: Shoulder Pain Orientation: Right  Precautions/Restrictions     Exercise/Treatments Mobility/Balance        R upper strap stretch 20 sec x 3  Modalities Modalities: Ultrasound Manual Therapy Manual Therapy: Myofascial release Joint Mobilization: right shoulder flexion and abduction PROM, gentle GH mobs inferior Myofascial Release: right upper and milddle trapezius, cervical PROM supine rotations  Ultrasound Ultrasound Parameters: right bieps insertion and middle deltoid, 50%, 1.2 w/cm3 x 10 min  Ultrasound Goals: Pain  Physical Therapy Assessment and Plan PT Assessment and Plan Clinical Impression Statement: very tender and spasm R upper trapezius , pateint has significant cervical fusion hx with current radicular symtpoms along with right shoulder pain consistent with arthritis changes and or impingement.  PT Plan: gentle fuiunctional shoulder AROM/strength, heat, manual, modlaites prn for pain   next session cervical soft tissue work and IFC right upper and middle trap depending on pain levels    Upper trapezius and levator stretches  Goals PT Short Term Goals Time to Complete Short Term Goals: 2 weeks PT Short Term Goal  1: Improve cervical AROM rotation left to 50 degrees for ADLS, dressing , driving  PT Short Term Goal 1 - Progress: Progressing toward goal PT Short Term Goal 2: To report decreased right UE and neck pain to 5/10 at worst  PT Short Term Goal 2 - Progress: Progressing toward goal PT Long Term Goals Time to Complete Long Term Goals: 4 weeks PT Long Term Goal 1: TO reach behind  head without increased pain in neck and shoulders  PT Long Term Goal 1 - Progress: Progressing toward goal PT Long Term Goal 2: Patient to report pain at wrost less than 4/10 during hygiene and household ADLS  PT Long Term Goal 2 - Progress: Progressing toward goal  Problem List Patient Active Problem List   Diagnosis Date Noted  . Bowel habit changes 01/12/2014  . Osteoarthritis of right hip 03/16/2013    Class: Chronic  . Generalized OA 10/29/2012  . Essential hypertension, benign 10/29/2012  . Obesity, unspecified 10/29/2012  . Chronic neck pain 10/29/2012  . Tobacco user 10/29/2012       GP    Darbi Chandran 01/19/2014, 2:15 PM

## 2014-01-25 ENCOUNTER — Ambulatory Visit (HOSPITAL_COMMUNITY): Payer: Medicare Other

## 2014-01-26 ENCOUNTER — Ambulatory Visit (HOSPITAL_COMMUNITY)
Admission: RE | Admit: 2014-01-26 | Discharge: 2014-01-26 | Disposition: A | Discharge status: Home / Self Care | Payer: Medicare Other | Source: Ambulatory Visit | Attending: Family Medicine | Admitting: Family Medicine

## 2014-01-26 ENCOUNTER — Ambulatory Visit (INDEPENDENT_AMBULATORY_CARE_PROVIDER_SITE_OTHER): Payer: PRIVATE HEALTH INSURANCE | Admitting: Internal Medicine

## 2014-01-26 ENCOUNTER — Encounter (INDEPENDENT_AMBULATORY_CARE_PROVIDER_SITE_OTHER): Payer: Self-pay | Admitting: Internal Medicine

## 2014-01-26 VITALS — BP 106/64 | HR 64 | Temp 98.8°F | Ht 65.0 in | Wt 206.1 lb

## 2014-01-26 DIAGNOSIS — M542 Cervicalgia: Principal | ICD-10-CM

## 2014-01-26 DIAGNOSIS — G8929 Other chronic pain: Secondary | ICD-10-CM

## 2014-01-26 DIAGNOSIS — Z1211 Encounter for screening for malignant neoplasm of colon: Secondary | ICD-10-CM

## 2014-01-26 NOTE — Progress Notes (Signed)
Physical Therapy Treatment Patient Details  Name: Kathy Barr MRN: [id number] Date of Birth: 1956/09/10  Today's Date: 01/26/2014 Time: 0174-9449 PT Time Calculation (min): 26 min Charge: TE [id number], Manual [id number]  Visit#: 5 of 8  Re-eval: 01/24/14 Assessment Diagnosis: neck pain/ fusion  Surgical Date: 02/25/14 Next MD Visit: Dr Lorin Mercy March 2015  Authorization: Medicare [place] Time Period:    Authorization Visit#: 5 of 8   Subjective: Symptoms/Limitations Symptoms: Pain free today, compliant with HEP.  Continues to have tingling to Rt fingers. Pain Assessment Currently in Pain?: No/denies  Objective:   Exercise/Treatments Stretches Upper Trapezius Stretch: 2 reps;30 seconds Machines for Strengthening UBE (Upper Arm Bike): 4' backwards 1.0 Theraband Exercises Scapula Retraction: 10 reps;Red Shoulder Extension: 10 reps;Red Rows: 10 reps;Red Shoulder External Rotation: 20 reps;Red Shoulder Internal Rotation: 20 reps;Red Standing Exercises Other Standing Exercises: 10x ER hands behing head, 10x IR hands by bra strap Seated Exercises Neck Retraction: 10 reps;5 secs;Limitations Neck Retraction Limitations: Tactile cueing required Cervical Rotation: Both;10 reps Lateral Flexion: Both;10 reps Postural Training: Posture awareness  Manual Therapy Manual Therapy: Myofascial release Myofascial Release: Bil upper and milddle trapezius, cervical PROM supine rotations  Physical Therapy Assessment and Plan PT Assessment and Plan Clinical Impression Statement: Session focus on improving cervical and shoulder ROM with exercises and manual techniques complete to reduce myofascial restrictions and spams.  Pt educated on importance of posture and began postural strengthening exercises with cueing for form and technique.  No reports of radicular symptpms down Rt UE/   PT Plan: gentle fuiunctional shoulder AROM/strength, heat, manual, modlaites prn for pain   next  session cervical soft tissue work and IFC right upper and middle trap depending on pain levels     Goals PT Short Term Goals Time to Complete Short Term Goals: 2 weeks PT Short Term Goal 1: Improve cervical AROM rotation left to 50 degrees for ADLS, dressing , driving  PT Short Term Goal 1 - Progress: Progressing toward goal PT Short Term Goal 2: To report decreased right UE and neck pain to 5/10 at worst  PT Long Term Goals Time to Complete Long Term Goals: 4 weeks PT Long Term Goal 1: TO reach behind  head without increased pain in neck and shoulders  PT Long Term Goal 1 - Progress: Progressing toward goal PT Long Term Goal 2: Patient to report pain at wrost less than 4/10 during hygiene and household ADLS   Problem List Patient Active Problem List   Diagnosis Date Noted  . Encounter for screening colonoscopy 01/26/2014  . Bowel habit changes 01/12/2014  . Osteoarthritis of right hip 03/16/2013    Class: Chronic  . Generalized OA 10/29/2012  . Essential hypertension, benign 10/29/2012  . Obesity, unspecified 10/29/2012  . Chronic neck pain 10/29/2012  . Tobacco user 10/29/2012    PT - End of Session Activity Tolerance: Patient tolerated treatment well General Behavior During Therapy: [facility] for tasks assessed/performed  GP    Aldona Lento 01/26/2014, 3:59 PM

## 2014-01-26 NOTE — Progress Notes (Signed)
Subjective:     Patient ID: Kathy Barr, female   DOB: 1956/04/29, 58 y.o.   MRN: [id number]  HPI Referred to our office by Dr. Buelah Manis for a screening colonoscopy. Patient has never undergone a colonoscopy in the past. There has been no change in her stools. She usually has at least 3 stools a day.  She usually has a BM right after she eats.  No family hx of colon cancer. Her stools are formed. No melena or bright red rectal bleeding.  Appetite is good. No weight loss. No abdominal pain.  She exercises daily. She does neck exercises and she walks.   Review of Systems see hpi Current Outpatient Prescriptions  Medication Sig Dispense Refill  . aspirin EC 325 MG tablet Take 1 tablet (325 mg total) by mouth 2 (two) times daily after a meal.  100 tablet  0  . celecoxib (CELEBREX) 200 MG capsule Take 1 capsule (200 mg total) by mouth every 12 (twelve) hours.  60 capsule  3  . cholecalciferol (VITAMIN D) 400 UNITS TABS Take 400 Units by mouth daily.      . valsartan-hydrochlorothiazide (DIOVAN-HCT) 160-25 MG per tablet TAKE ONE TABLET BY MOUTH EVERY DAY BEFORE BREAKFAST  90 tablet  1   No current facility-administered medications for this visit.   Past Medical History  Diagnosis Date  . Hypertension   . Fracture 2013    RLE  . GERD (gastroesophageal reflux disease)     OTC  . Shortness of breath     "not since I quit smoking" (03/18/2013)  . Carpal tunnel syndrome     "right" (03/18/2013)  . Seizures 1960's; 1978    "when I was little; when I was 7 months pregnant" (03/18/2013)  . Arthritis     "all over" (03/18/2013)  . Osteoarthritis of left hip    Past Surgical History  Procedure Laterality Date  . Foot arthrotomy Right 1990's    pin, after removing a bone   . Total hip arthroplasty  06/30/2012    Procedure: TOTAL HIP ARTHROPLASTY;  Surgeon: Jessy Oto, MD;  Location: [facility];  Service: Orthopedics;  Laterality: Left;  Left total hip replacement with metal and polypropylene pore  coated implants  . Joint replacement Left     hip  . Total hip arthroplasty Right 03/16/2013    Procedure: RIGHT TOTAL HIP ARTHROPLASTY- right ;  Surgeon: Jessy Oto, MD;  Location: [place];  Service: Orthopedics;  Laterality: Right;  . Cholecystectomy  2003  . Tubal ligation  1978  . Vaginal hysterectomy  ~ 2003  . Anterior cervical decomp/discectomy fusion  ?2003   No Known Allergies  She is widowed. She has one child. One deceased at birth.  She is disabled.     Objective:   Physical Exam  Filed Vitals:   01/26/14 1453  BP: 106/64  Pulse: 64  Temp: 98.8 F (37.1 C)  Height: 5\' 5"  (1.651 m)  Weight: 206 lb 1.6 oz (93.486 kg)  Alert and oriented. Skin warm and dry. Oral mucosa is moist.   . Sclera anicteric, conjunctivae is pink. Thyroid not enlarged. No cervical lymphadenopathy. Lungs clear. Heart regular rate and rhythm.  Abdomen is soft. Bowel sounds are positive. No hepatomegaly. No abdominal masses felt. No tenderness.  No edema to lower extremities.        Assessment:   Screening colonoscopy. Normal exam.     Plan:    Screening colonoscopy.The risks and benefits such as  perforation, bleeding, and infection were reviewed with the patient and is agreeable.

## 2014-01-27 ENCOUNTER — Ambulatory Visit (HOSPITAL_COMMUNITY): Payer: Medicare Other | Admitting: Physical Therapy

## 2014-01-27 ENCOUNTER — Telehealth (INDEPENDENT_AMBULATORY_CARE_PROVIDER_SITE_OTHER): Payer: Self-pay | Admitting: *Deleted

## 2014-01-27 ENCOUNTER — Other Ambulatory Visit (INDEPENDENT_AMBULATORY_CARE_PROVIDER_SITE_OTHER): Payer: Self-pay | Admitting: *Deleted

## 2014-01-27 ENCOUNTER — Telehealth (HOSPITAL_COMMUNITY): Payer: Self-pay

## 2014-01-27 ENCOUNTER — Encounter (HOSPITAL_COMMUNITY): Payer: Self-pay | Admitting: Pharmacy Technician

## 2014-01-27 DIAGNOSIS — Z1211 Encounter for screening for malignant neoplasm of colon: Secondary | ICD-10-CM

## 2014-01-27 MED ORDER — PEG 3350-KCL-NA BICARB-NACL 420 G PO SOLR: 4000.0000 mL | Freq: Once | ORAL | Status: DC

## 2014-01-27 NOTE — Telephone Encounter (Signed)
Patient needs trilyte 

## 2014-02-01 ENCOUNTER — Ambulatory Visit (HOSPITAL_COMMUNITY): Payer: Medicare Other | Admitting: *Deleted

## 2014-02-02 ENCOUNTER — Ambulatory Visit (HOSPITAL_COMMUNITY)
Admission: RE | Admit: 2014-02-02 | Discharge: 2014-02-02 | Disposition: A | Discharge status: Home / Self Care | Payer: Medicare Other | Source: Ambulatory Visit | Attending: Family Medicine | Admitting: Family Medicine

## 2014-02-02 NOTE — Progress Notes (Signed)
Physical Therapy Treatment Patient Details  Name: Kathy Barr MRN: [id number] Date of Birth: [birth year]  Today's Date: 02/02/2014 Time: 2947-6546 PT Time Calculation (min): 40 min Visit#: 6 of 8  Authorization: Medicare [employer]   Authorization Visit#: 6 of 8  Charges:  therex [id number] (23'), estim/moist heat 1500-1515 (15')  Subjective: Symptoms/Limitations Symptoms: Pt states she's been doing housework today.  Not having any neck pain but still having the tingling into her Rt UE/fingers. Pain Assessment Currently in Pain?: No/denies   Exercise/Treatments Machines for Strengthening UBE (Upper Arm Bike): 4' backwards 1.0 Theraband Exercises Scapula Retraction: 15 reps;Red Shoulder Extension: 15 reps;Red Rows: 15 reps;Red Shoulder External Rotation: 15 reps;Red Shoulder Internal Rotation: 15 reps;Red    Modalities Modalities: Electrical Stimulation;Moist Heat Moist Heat Therapy Number Minutes Moist Heat: 15 Minutes Moist Heat Location: Shoulder Electrical Stimulation Electrical Stimulation Location: Rt upper/mid trap into cervical region Electrical Stimulation Action: IFES to decrease pain and radiculopathy Electrical Stimulation Parameters: Hi/lo sweep, intensity 10 Volts supine with MHP  Electrical Stimulation Goals: Pain  Physical Therapy Assessment and Plan PT Assessment and Plan Clinical Impression Statement: Trial of Interferential electrical stimulation (IFES) with Moist heat in supine today to attempt decreasing radiculopathy symptoms.  Pt without pain, however continues with symptoms into Rt UE. PT Plan: Assess effectiveness of electrical stim next visit.  Re-[place]alaute X 2 more visits.     Problem List Patient Active Problem List   Diagnosis Date Noted  . Encounter for screening colonoscopy 01/26/2014  . Bowel habit changes 01/12/2014  . Osteoarthritis of right hip 03/16/2013    Class: Chronic  . Generalized OA 10/29/2012  . Essential hypertension, benign  10/29/2012  . Obesity, unspecified 10/29/2012  . Chronic neck pain 10/29/2012  . Tobacco user 10/29/2012    PT - End of Session Activity Tolerance: Patient tolerated treatment well General Behavior During Therapy: [facility] for tasks assessed/performed   Teena Irani, PTA/CLT 02/02/2014, 3:06 PM

## 2014-02-03 ENCOUNTER — Ambulatory Visit (HOSPITAL_COMMUNITY): Payer: Medicare Other | Admitting: Physical Therapy

## 2014-02-08 ENCOUNTER — Ambulatory Visit (HOSPITAL_COMMUNITY): Payer: Medicare Other | Admitting: Physical Therapy

## 2014-02-09 ENCOUNTER — Ambulatory Visit (HOSPITAL_COMMUNITY): Payer: Medicare Other | Admitting: Physical Therapy

## 2014-02-10 ENCOUNTER — Ambulatory Visit (HOSPITAL_COMMUNITY): Payer: Medicare Other | Admitting: Physical Therapy

## 2014-02-17 ENCOUNTER — Encounter (HOSPITAL_COMMUNITY): Payer: Self-pay | Admitting: *Deleted

## 2014-02-17 ENCOUNTER — Encounter (HOSPITAL_COMMUNITY)
Admission: RE | Disposition: A | Discharge status: Home / Self Care | Payer: Self-pay | Source: Ambulatory Visit | Attending: Internal Medicine

## 2014-02-17 ENCOUNTER — Ambulatory Visit (HOSPITAL_COMMUNITY)
Admission: RE | Admit: 2014-02-17 | Discharge: 2014-02-17 | Disposition: A | Discharge status: Home / Self Care | Payer: PRIVATE HEALTH INSURANCE | Source: Ambulatory Visit | Attending: Internal Medicine | Admitting: Internal Medicine

## 2014-02-17 DIAGNOSIS — Z1211 Encounter for screening for malignant neoplasm of colon: Secondary | ICD-10-CM | POA: Insufficient documentation

## 2014-02-17 DIAGNOSIS — D126 Benign neoplasm of colon, unspecified: Secondary | ICD-10-CM

## 2014-02-17 DIAGNOSIS — K648 Other hemorrhoids: Secondary | ICD-10-CM

## 2014-02-17 DIAGNOSIS — I10 Essential (primary) hypertension: Secondary | ICD-10-CM | POA: Insufficient documentation

## 2014-02-17 DIAGNOSIS — Z7982 Long term (current) use of aspirin: Secondary | ICD-10-CM | POA: Insufficient documentation

## 2014-02-17 DIAGNOSIS — Z79899 Other long term (current) drug therapy: Secondary | ICD-10-CM | POA: Insufficient documentation

## 2014-02-17 HISTORY — PX: COLONOSCOPY: SHX5424

## 2014-02-17 SURGERY — COLONOSCOPY
Anesthesia: Moderate Sedation

## 2014-02-17 MED ORDER — MEPERIDINE HCL 50 MG/ML IJ SOLN
INTRAMUSCULAR | Status: DC | PRN
  Administered 2014-02-17 (×2): 25 mg via INTRAVENOUS

## 2014-02-17 MED ORDER — MEPERIDINE HCL 50 MG/ML IJ SOLN
INTRAMUSCULAR | Status: AC
  Filled 2014-02-17: qty 1

## 2014-02-17 MED ORDER — MIDAZOLAM HCL 5 MG/5ML IJ SOLN
INTRAMUSCULAR | Status: DC | PRN
  Administered 2014-02-17 (×4): 2 mg via INTRAVENOUS

## 2014-02-17 MED ORDER — MIDAZOLAM HCL 5 MG/5ML IJ SOLN
INTRAMUSCULAR | Status: AC
  Filled 2014-02-17: qty 10

## 2014-02-17 MED ORDER — STERILE WATER FOR IRRIGATION IR SOLN
Status: DC | PRN
  Administered 2014-02-17: 10:00:00

## 2014-02-17 MED ORDER — SODIUM CHLORIDE 0.9 % IV SOLN
INTRAVENOUS | Status: DC
  Administered 2014-02-17: 1000 mL via INTRAVENOUS

## 2014-02-17 NOTE — Op Note (Signed)
COLONOSCOPY PROCEDURE REPORT  PATIENT:  Kathy Barr  MR#:  [id number] Birthdate:  [birth year], 58 y.o., female Endoscopist:  Dr. Rogene Houston, MD Referred By:  Dr. Vic Blackbird, MD Procedure Date: 02/17/2014  Procedure:   Colonoscopy  Indications:  Patient is 58 year old Caucasian femalewho is undergoing average risk screening colonoscopy.  Informed Consent:  The procedure and risks were reviewed with the patient and informed consent was obtained.  Medications:  Demerol 50 IV Versed 8  IV  Description of procedure:  After a digital rectal exam was performed, that colonoscope was advanced from the anus through the rectum and colon to the area of the cecum, ileocecal valve and appendiceal orifice. The cecum was deeply intubated. These structures were well-seen and photographed for the record. From the level of the cecum and ileocecal valve, the scope was slowly and cautiously withdrawn. The mucosal surfaces were carefully surveyed utilizing scope tip to flexion to facilitate fold flattening as needed. The scope was pulled down into the rectum where a thorough exam including retroflexion was performed.  Findings:   Prep satisfactory. 4 x 6 bilobed polyp snared from cecum. One-piece was coagulated during this process. Mucosa of the rest of the colon was normal. Normal rectal mucosa. Small hemorrhoids below the dentate line.   Therapeutic/Diagnostic Maneuvers Performed:  See above  Complications:  None  Cecal Withdrawal Time:  11 minutes  Impression:  Examination performed to cecum. 4 x 6 mm polyp snared from the cecum. Part of this polyp was coagulated during this process. Small external hemorrhoids.  Recommendations:  Standard instructions given. No aspirin or NSAIDs for 5 days. I will contact patient with biopsy results and further recommendations.  N[employer]  02/17/2014 10:51 AM  CC: Dr. Vic Blackbird, MD & Dr. Rayne Du ref. provider found

## 2014-02-17 NOTE — H&P (Signed)
Kathy Barr is an 58 y.o. female.   Chief Complaint: Patient is here for colonoscopy. HPI: Patient is 58 year old Caucasian female was screening colonoscopy. She denies abdominal pain change in bowel habits or rectal bleeding however she did notice some some bleeding this morning which she believes is secondary to agitation from the prep. Family history is negative for CRC.  Past Medical History  Diagnosis Date  . Hypertension   . Fracture 2013    RLE  . GERD (gastroesophageal reflux disease)     OTC  . Shortness of breath     "not since I quit smoking" (03/18/2013)  . Carpal tunnel syndrome     "right" (03/18/2013)  . Seizures 1960's; 1978    "when I was little; when I was 7 months pregnant" (03/18/2013)  . Arthritis     "all over" (03/18/2013)  . Osteoarthritis of left hip     Past Surgical History  Procedure Laterality Date  . Foot arthrotomy Right 1990's    pin, after removing a bone   . Total hip arthroplasty  06/30/2012    Procedure: TOTAL HIP ARTHROPLASTY;  Surgeon: Jessy Oto, MD;  Location: [facility];  Service: Orthopedics;  Laterality: Left;  Left total hip replacement with metal and polypropylene pore coated implants  . Joint replacement Left     hip  . Total hip arthroplasty Right 03/16/2013    Procedure: RIGHT TOTAL HIP ARTHROPLASTY- right ;  Surgeon: Jessy Oto, MD;  Location: [place];  Service: Orthopedics;  Laterality: Right;  . Cholecystectomy  2003  . Tubal ligation  1978  . Vaginal hysterectomy  ~ 2003  . Anterior cervical decomp/discectomy fusion  ?2003    Family History  Problem Relation Age of Onset  . Hypertension Father   . Diabetes Father   . Hypertension Sister   . Diabetes Sister   . Hypertension Brother   . Diabetes Brother    Social History:  reports that she has quit smoking. Her smoking use included Cigarettes. She has a 6.25 pack-year smoking history. She has never used smokeless tobacco. She reports that she does not drink alcohol or use  illicit drugs.  Allergies: No Known Allergies  Medications Prior to Admission  Medication Sig Dispense Refill  . aspirin EC 325 MG tablet Take 1 tablet (325 mg total) by mouth 2 (two) times daily after a meal.  100 tablet  0  . celecoxib (CELEBREX) 200 MG capsule Take 1 capsule (200 mg total) by mouth every 12 (twelve) hours.  60 capsule  3  . cholecalciferol (VITAMIN D) 400 UNITS TABS Take 400 Units by mouth daily.      . polyethylene glycol-electrolytes (NULYTELY/GOLYTELY) 420 G solution Take 4,000 mLs by mouth once.  4000 mL  0  . valsartan-hydrochlorothiazide (DIOVAN-HCT) 160-25 MG per tablet TAKE ONE TABLET BY MOUTH EVERY DAY BEFORE BREAKFAST  90 tablet  1    No results found for this or any previous visit (from the past 48 hour(s)). No results found.  ROS  Blood pressure 121/77, pulse 77, temperature 97.6 F (36.4 C), temperature source Oral, resp. rate 15, height 5\' 5"  (1.651 m), weight 205 lb (92.987 kg), SpO2 97.00%. Physical Exam  Constitutional: She appears well-developed and well-nourished.  HENT:  Mouth/Throat: Oropharynx is clear and moist.  Eyes: Conjunctivae are normal. No scleral icterus.  Neck: No thyromegaly present.  Cardiovascular: Normal rate, regular rhythm and normal heart sounds.   No murmur heard. Respiratory: Effort normal and breath  sounds normal.  GI: Soft. She exhibits no distension and no mass. There is no tenderness.  Musculoskeletal: She exhibits no edema.  Lymphadenopathy:    She has no cervical adenopathy.  Neurological: She is alert.  Skin: Skin is warm and dry.     Assessment/Plan Average risk screening colonoscopy.  Marua Qin U 02/17/2014, 10:14 AM

## 2014-02-17 NOTE — Discharge Instructions (Signed)
Resume usual medications but no aspirin or celecoxib for 5 days. Resume usual diet. No driving for 24 hours. Physician will contact you with biopsy results.  Colonoscopy, Care After Refer to this sheet in the next few weeks. These instructions provide you with information on caring for yourself after your procedure. Your health care provider may also give you more specific instructions. Your treatment has been planned according to current medical practices, but problems sometimes occur. Call your health care provider if you have any problems or questions after your procedure. WHAT TO EXPECT AFTER THE PROCEDURE  After your procedure, it is typical to have the following:  A small amount of blood in your stool.  Moderate amounts of gas and mild abdominal cramping or bloating. HOME CARE INSTRUCTIONS  Do not drive, operate machinery, or sign important documents for 24 hours.  You may shower and resume your regular physical activities, but move at a slower pace for the first 24 hours.  Take frequent rest periods for the first 24 hours.  Walk around or put a warm pack on your abdomen to help reduce abdominal cramping and bloating.  Drink enough fluids to keep your urine clear or pale yellow.  You may resume your normal diet as instructed by your health care provider. Avoid heavy or fried foods that are hard to digest.  Avoid drinking alcohol for 24 hours or as instructed by your health care provider.  Only take over-the-counter or prescription medicines as directed by your health care provider.  If a tissue sample (biopsy) was taken during your procedure:  Do not take aspirin or blood thinners for 7 days, or as instructed by your health care provider.  Do not drink alcohol for 7 days, or as instructed by your health care provider.  Eat soft foods for the first 24 hours. SEEK MEDICAL CARE IF: You have persistent spotting of blood in your stool 2 3 days after the procedure. SEEK  IMMEDIATE MEDICAL CARE IF:  You have more than a small spotting of blood in your stool.  You pass large blood clots in your stool.  Your abdomen is swollen (distended).  You have nausea or vomiting.  You have a fever.  You have increasing abdominal pain that is not relieved with medicine. Document Released: 07/09/2004 Document Revised: 09/15/2013 Document Reviewed: 08/02/2013 [facility] Patient Information 2014 [place]. Colon Polyps Polyps are lumps of extra tissue growing inside the body. Polyps can grow in the large intestine (colon). Most colon polyps are noncancerous (benign). However, some colon polyps can become cancerous over time. Polyps that are larger than a pea may be harmful. To be safe, caregivers remove and test all polyps. CAUSES  Polyps form when mutations in the genes cause your cells to grow and divide even though no more tissue is needed. RISK FACTORS There are a number of risk factors that can increase your chances of getting colon polyps. They include:  Being older than 50 years.  Family history of colon polyps or colon cancer.  Long-term colon diseases, such as colitis or Crohn disease.  Being overweight.  Smoking.  Being inactive.  Drinking too much alcohol. SYMPTOMS  Most small polyps do not cause symptoms. If symptoms are present, they may include:  Blood in the stool. The stool may look dark red or black.  Constipation or diarrhea that lasts longer than 1 week. DIAGNOSIS People often do not know they have polyps until their caregiver finds them during a regular checkup. Your caregiver can  use 4 tests to check for polyps:  Digital rectal exam. The caregiver wears gloves and feels inside the rectum. This test would find polyps only in the rectum.  Barium enema. The caregiver puts a liquid called barium into your rectum before taking X-rays of your colon. Barium makes your colon look white. Polyps are dark, so they are easy to see in the  X-ray pictures.  Sigmoidoscopy. A thin, flexible tube (sigmoidoscope) is placed into your rectum. The sigmoidoscope has a light and tiny camera in it. The caregiver uses the sigmoidoscope to look at the last third of your colon.  Colonoscopy. This test is like sigmoidoscopy, but the caregiver looks at the entire colon. This is the most common method for finding and removing polyps. TREATMENT  Any polyps will be removed during a sigmoidoscopy or colonoscopy. The polyps are then tested for cancer. PREVENTION  To help lower your risk of getting more colon polyps:  Eat plenty of fruits and vegetables. Avoid eating fatty foods.  Do not smoke.  Avoid drinking alcohol.  Exercise every day.  Lose weight if recommended by your caregiver.  Eat plenty of calcium and folate. Foods that are rich in calcium include milk, cheese, and broccoli. Foods that are rich in folate include chickpeas, kidney beans, and spinach. HOME CARE INSTRUCTIONS Keep all follow-up appointments as directed by your caregiver. You may need periodic exams to check for polyps. SEEK MEDICAL CARE IF: You notice bleeding during a bowel movement. Document Released: 08/21/2004 Document Revised: 02/17/2012 Document Reviewed: 02/04/2012 [facility] Patient Information 2014 [place].

## 2014-02-21 ENCOUNTER — Encounter (HOSPITAL_COMMUNITY): Payer: Self-pay | Admitting: Internal Medicine

## 2014-03-03 ENCOUNTER — Encounter (INDEPENDENT_AMBULATORY_CARE_PROVIDER_SITE_OTHER): Payer: Self-pay | Admitting: *Deleted

## 2014-05-25 ENCOUNTER — Ambulatory Visit (INDEPENDENT_AMBULATORY_CARE_PROVIDER_SITE_OTHER): Payer: Medicare Other | Admitting: Women's Health

## 2014-05-25 ENCOUNTER — Encounter: Payer: Self-pay | Admitting: Women's Health

## 2014-05-25 VITALS — BP 100/60 | Ht 64.0 in | Wt 202.0 lb

## 2014-05-25 DIAGNOSIS — IMO0002 Reserved for concepts with insufficient information to code with codable children: Secondary | ICD-10-CM

## 2014-05-25 DIAGNOSIS — R319 Hematuria, unspecified: Secondary | ICD-10-CM

## 2014-05-25 DIAGNOSIS — R102 Pelvic and perineal pain: Secondary | ICD-10-CM

## 2014-05-25 DIAGNOSIS — N949 Unspecified condition associated with female genital organs and menstrual cycle: Secondary | ICD-10-CM

## 2014-05-25 LAB — POCT URINALYSIS DIPSTICK
Glucose, UA: NEGATIVE
KETONES UA: NEGATIVE
LEUKOCYTES UA: NEGATIVE
Nitrite, UA: NEGATIVE
PROTEIN UA: NEGATIVE

## 2014-05-25 LAB — POCT WET PREP (WET MOUNT): Clue Cells Wet Prep Whiff POC: NEGATIVE

## 2014-05-25 NOTE — Progress Notes (Signed)
Patient ID: Kathy Barr, female   DOB: 1956/06/07, 58 y.o.   MRN: [id number]   [facility] Visit  Patient name: Kathy Barr MRN [id number]  Date of birth: [birth year]  CC & HPI:  Kathy Barr is a 58 y.o. Caucasian female presenting today for report of constant sharp lower abd/pelvic pain, vaginal dryness, vaginal burning w/ intercourse, and blood in her urine the other day. S/P hysterectomy w/ unilateral SO 2003. Reports normal bm's 'every time I eat', no constipation, hemorrhoids, or straining. Denies n/v/d, urinary frequency/hesitancy/urgency, fever/chills, flank pain, abnormal or malodorous vag d/c. Has had same sexual partner x 7years.   Pertinent History Reviewed:  Medical & Surgical Hx:   Past Medical History  Diagnosis Date  . Hypertension   . Fracture 2013    RLE  . GERD (gastroesophageal reflux disease)     OTC  . Shortness of breath     "not since I quit smoking" (03/18/2013)  . Carpal tunnel syndrome     "right" (03/18/2013)  . Seizures 1960's; 1978    "when I was little; when I was 7 months pregnant" (03/18/2013)  . Arthritis     "all over" (03/18/2013)  . Osteoarthritis of left hip    Past Surgical History  Procedure Laterality Date  . Foot arthrotomy Right 1990's    pin, after removing a bone   . Total hip arthroplasty  06/30/2012    Procedure: TOTAL HIP ARTHROPLASTY;  Surgeon: Jessy Oto, MD;  Location: [facility];  Service: Orthopedics;  Laterality: Left;  Left total hip replacement with metal and polypropylene pore coated implants  . Joint replacement Left     hip  . Total hip arthroplasty Right 03/16/2013    Procedure: RIGHT TOTAL HIP ARTHROPLASTY- right ;  Surgeon: Jessy Oto, MD;  Location: [facility];  Service: Orthopedics;  Laterality: Right;  . Cholecystectomy  2003  . Tubal ligation  1978  . Vaginal hysterectomy  ~ 2003  . Anterior cervical decomp/discectomy fusion  ?2003  . Colonoscopy N/A 02/17/2014    Procedure: COLONOSCOPY;  Surgeon: Rogene Houston, MD;  [facility];  Service: Endoscopy;  Laterality: N/A;  145-moved to 10:30 Ann to notify pt   Medications: Reviewed & Updated - see associated section Social History: Reviewed -  reports that she has quit smoking. Her smoking use included Cigarettes. She has a 6.25 pack-year smoking history. She has never used smokeless tobacco.  Objective Findings:  Vitals: BP 100/60  Ht 5\' 4"  (1.626 m)  Wt 202 lb (91.627 kg)  BMI 34.66 kg/m2  Physical Examination: General appearance - alert, well appearing, and in no distress Abdomen - soft, no masses, or organomegaly. +diffuse tenderness lower abdomen/pelvis, most tender in LLQ Pelvic - normal post-menopausal appearance, decreased rugae, some moisture, no visible d/c, nonodorous, normal appearing vaginal cuff.  Bimanual: some tenderness around vaginal cuff and adnexal tenderness  Results for orders placed in visit on 05/25/14 (from the past 24 hour(s))  POCT URINALYSIS DIPSTICK   Collection Time    05/25/14  1:46 PM      Result Value Ref Range   Color, UA       Clarity, UA       Glucose, UA neg     Bilirubin, UA       Ketones, UA neg     Spec Grav, UA       Blood, UA trace     pH, UA  Protein, UA neg     Urobilinogen, UA       Nitrite, UA neg     Leukocytes, UA Negative    POCT WET PREP (WET MOUNT)   Collection Time    05/25/14  2:22 PM      Result Value Ref Range   Source Wet Prep POC vaginal     WBC, Wet Prep HPF POC none     Bacteria Wet Prep HPF POC none     BACTERIA WET PREP MORPHOLOGY POC       Clue Cells Wet Prep HPF POC None     CLUE CELLS WET PREP WHIFF POC Negative Whiff     Yeast Wet Prep HPF POC None     KOH Wet Prep POC       Trichomonas Wet Prep HPF POC none       Assessment & Plan:  A:   Trace hematuria  Dyspareunia  Lower abd/pelvic pain P:  To try luvena qod, astroglide w/ q sexual encounter   Will consider premarin if doesn't help  F/U 1wk for pelvic u/s and f/u visit w/  me   Tawnya Crook CNM, WHNP-BC 05/25/2014 2:22 PM

## 2014-05-25 NOTE — Patient Instructions (Addendum)
[place] every other day ([facility], [place] [address]), Astroglide every time you have sex  Pelvic Pain, Female Female pelvic pain can be caused by many different things and start from a variety of places. Pelvic pain refers to pain that is located in the lower half of the abdomen and between your hips. The pain may occur over a short period of time (acute) or may be reoccurring (chronic). The cause of pelvic pain may be related to disorders affecting the female reproductive organs (gynecologic), but it may also be related to the bladder, kidney stones, an intestinal complication, or muscle or skeletal problems. Getting help right away for pelvic pain is important, especially if there has been severe, sharp, or a sudden onset of unusual pain. It is also important to get help right away because some types of pelvic pain can be life threatening.  CAUSES  Below are only some of the causes of pelvic pain. The causes of pelvic pain can be in one of several categories.   Gynecologic.  Pelvic inflammatory disease.  Sexually transmitted infection.  Ovarian cyst or a twisted ovarian ligament (ovarian torsion).  Uterine lining that grows outside the uterus (endometriosis).  Fibroids, cysts, or tumors.  Ovulation.  Pregnancy.  Pregnancy that occurs outside the uterus (ectopic pregnancy).  Miscarriage.  Labor.  Abruption of the placenta or ruptured uterus.  Infection.  Uterine infection (endometritis).  Bladder infection.  Diverticulitis.  Miscarriage related to a uterine infection (septic abortion).  Bladder.  Inflammation of the bladder (cystitis).  Kidney stone(s).  Gastrointenstinal.  Constipation.  Diverticulitis.  Neurologic.  Trauma.  Feeling pelvic pain because of mental or emotional causes (psychosomatic).  Cancers of the bowel or pelvis. EVALUATION  Your caregiver will want to take a careful history of your concerns. This includes recent changes in your health,  a careful gynecologic history of your periods (menses), and a sexual history. Obtaining your family history and medical history is also important. Your caregiver may suggest a pelvic exam. A pelvic exam will help identify the location and severity of the pain. It also helps in the evaluation of which organ system may be involved. In order to identify the cause of the pelvic pain and be properly treated, your caregiver may order tests. These tests may include:   A pregnancy test.  Pelvic ultrasonography.  An X-ray exam of the abdomen.  A urinalysis or evaluation of vaginal discharge.  Blood tests. HOME CARE INSTRUCTIONS   Only take over-the-counter or prescription medicines for pain, discomfort, or fever as directed by your caregiver.   Rest as directed by your caregiver.   Eat a balanced diet.   Drink enough fluids to make your urine clear or pale yellow, or as directed.   Avoid sexual intercourse if it causes pain.   Apply warm or cold compresses to the lower abdomen depending on which one helps the pain.   Avoid stressful situations.   Keep a journal of your pelvic pain. Write down when it started, where the pain is located, and if there are things that seem to be associated with the pain, such as food or your menstrual cycle.  Follow up with your caregiver as directed.  SEEK MEDICAL CARE IF:  Your medicine does not help your pain.  You have abnormal vaginal discharge. SEEK IMMEDIATE MEDICAL CARE IF:   You have heavy bleeding from the vagina.   Your pelvic pain increases.   You feel lightheaded or faint.   You have chills.   You  have pain with urination or blood in your urine.   You have uncontrolled diarrhea or vomiting.   You have a fever or persistent symptoms for more than 3 days.  You have a fever and your symptoms suddenly get worse.   You are being physically or sexually abused.  MAKE SURE YOU:  Understand these instructions.  Will  watch your condition.  Will get help if you are not doing well or get worse. Document Released: 10/22/2004 Document Revised: 05/26/2012 Document Reviewed: 03/16/2012 [facility] Patient Information 2015 [place], [place]. This information is not intended to replace advice given to you by your health care provider. Make sure you discuss any questions you have with your health care provider.

## 2014-06-01 ENCOUNTER — Other Ambulatory Visit: Payer: Self-pay | Admitting: Women's Health

## 2014-06-01 ENCOUNTER — Ambulatory Visit (INDEPENDENT_AMBULATORY_CARE_PROVIDER_SITE_OTHER): Payer: Medicare Other | Admitting: Women's Health

## 2014-06-01 ENCOUNTER — Encounter: Payer: Self-pay | Admitting: Women's Health

## 2014-06-01 ENCOUNTER — Ambulatory Visit (INDEPENDENT_AMBULATORY_CARE_PROVIDER_SITE_OTHER): Payer: Medicare Other

## 2014-06-01 VITALS — BP 106/68 | Ht 65.0 in | Wt 200.0 lb

## 2014-06-01 DIAGNOSIS — N949 Unspecified condition associated with female genital organs and menstrual cycle: Secondary | ICD-10-CM

## 2014-06-01 DIAGNOSIS — R102 Pelvic and perineal pain: Secondary | ICD-10-CM

## 2014-06-01 DIAGNOSIS — Z01419 Encounter for gynecological examination (general) (routine) without abnormal findings: Secondary | ICD-10-CM

## 2014-06-01 DIAGNOSIS — I10 Essential (primary) hypertension: Secondary | ICD-10-CM

## 2014-06-01 NOTE — Progress Notes (Signed)
Patient ID: Kathy Barr, female   DOB: Dec 16, 1955, 58 y.o.   MRN: [id number] Subjective:   Kathy Barr is a 58 y.o.  Caucasian female here for a routine well-woman exam and f/u after pelvic u/s for lower abd/pelvic pain. S/p hysterectomy w/ what she reports as unilateral SO in 2003. Denies h/o abnormal pap smears in past. Still having the constant sharp lower abd/pelvic pain. BMs w/ every meal, not diarrheal. No constipation. She is s/p cholecystectomy. Pain seems to improve some after BMs. Her vaginal dryness/burning w/ intercourse has improved w/ Luvena & Astroglide.  H/O OA w/ chronic neck/shoulder pain, has been followed by neurosurgery & PT H/O CHTN, well controlled on meds Smokes 1/2ppd- interested in quitting, offered QuitlineNC- declined, states she can quit on own, as she has done it before.  No LMP recorded. Patient has had a hysterectomy.     PCP: Dr. Buelah Manis       Does desire labs, had CBC, BMP, lipid panel in Feb, would like TSH and A1C  The following portions of the patient's history were reviewed and updated as appropriate: allergies, current medications, past family history, past medical history, past social history, past surgical history and problem list.  Past Medical History Past Medical History  Diagnosis Date  . Hypertension   . Fracture 2013    RLE  . GERD (gastroesophageal reflux disease)     OTC  . Shortness of breath     "not since I quit smoking" (03/18/2013)  . Carpal tunnel syndrome     "right" (03/18/2013)  . Seizures 1960's; 1978    "when I was little; when I was 7 months pregnant" (03/18/2013)  . Arthritis     "all over" (03/18/2013)  . Osteoarthritis of left hip     Past Surgical History Past Surgical History  Procedure Laterality Date  . Foot arthrotomy Right 1990's    pin, after removing a bone   . Total hip arthroplasty  06/30/2012    Procedure: TOTAL HIP ARTHROPLASTY;  Surgeon: Jessy Oto, MD;  Location: [facility];  Service: Orthopedics;   Laterality: Left;  Left total hip replacement with metal and polypropylene pore coated implants  . Joint replacement Left     hip  . Total hip arthroplasty Right 03/16/2013    Procedure: RIGHT TOTAL HIP ARTHROPLASTY- right ;  Surgeon: Jessy Oto, MD;  Location: [facility];  Service: Orthopedics;  Laterality: Right;  . Cholecystectomy  2003  . Tubal ligation  1978  . Vaginal hysterectomy  ~ 2003  . Anterior cervical decomp/discectomy fusion  ?2003  . Colonoscopy N/A 02/17/2014    Procedure: COLONOSCOPY;  Surgeon: Rogene Houston, MD;  [facility];  Service: Endoscopy;  Laterality: N/A;  145-moved to 10:30 Ann to notify pt  . Pinched nerve      Gynecologic History No obstetric history on file.  No LMP recorded. Patient has had a hysterectomy. Contraception: post menopausal status Last Pap: years ago. Results were: normal Last mammogram: Feb 2015. Results were: normal Last TCS: March 2015, 1 polyp->tubular adenoma, no malignancy- recommended TCS in 3yrs  Obstetric History OB History  No data available    Current Medications Current Outpatient Prescriptions on File Prior to Visit  Medication Sig Dispense Refill  . cholecalciferol (VITAMIN D) 400 UNITS TABS Take 400 Units by mouth daily.      . cyclobenzaprine (FLEXERIL) 10 MG tablet Take 10 mg by mouth 3 (three) times daily as needed for muscle  spasms.      Marland Kitchen gabapentin (NEURONTIN) 300 MG capsule Take 300 mg by mouth 3 (three) times daily.      . valsartan-hydrochlorothiazide (DIOVAN-HCT) 160-25 MG per tablet TAKE ONE TABLET BY MOUTH EVERY DAY BEFORE BREAKFAST  90 tablet  1   No current facility-administered medications on file prior to visit.    Review of Systems Patient denies any headaches, blurred vision, shortness of breath, chest pain, abdominal pain, problems with bowel movements, urination, or intercourse.  Objective:  BP 106/68  Ht 5\' 5"  (1.651 m)  Wt 200 lb (90.719 kg)  BMI 33.28 kg/m2 Physical Exam   General:  Well developed, well nourished, no acute distress. She is alert and oriented x3. Skin:  Warm and dry Neck:  Midline trachea, no thyromegaly or nodules Cardiovascular: Regular rate and rhythm, no murmur heard Lungs:  Effort normal, all lung fields clear to auscultation bilaterally Breasts:  No dominant palpable mass, retraction, or nipple discharge Abdomen:  Soft, no hepatosplenomegaly or masses, +mild diffuse tenderness Pelvic:  External genitalia is normal in appearance.  The vagina is atrophic w/o rugae, and moisture has increased since last exam. Normal appearing vaginal cuff. Thin prep pap is not done as she is s/p hysterectomy w/ normal paps in past  No adnexal masses or tenderness noted. Rectal: Good sphincter tone, no polyps, or hemorrhoids felt.  Hemoccult: neg Extremities:  No swelling or varicosities noted Psych:  She has a normal mood and affect  Today's pelvic u/s:  Uterus Surgically Absent Vaginal Cuff appears WNL  Right ovary Unable to view ovarian tissue, although no obvious mass or fluid collection noted within the adnexa  Left ovary Unable to view ovarian tissue, although no obvious mass or fluid collection noted within the adnexa  No free fluid or adnexal masses noted within the pelvis  Technician Comments:  Vaginal cuff appears WNL, bilateral adnexa appears WNL no free fluid or adnexal masses noted within the pelvis  Lazarus Gowda  06/01/2014  11:39 AM   Assessment:   Healthy well-woman exam Lower abd/pelvic pain, GYN etiology ruled out, possibly IBS Vaginal dryness/burning w/ intercourse improved Smoker Obesity, BMI 33 Plan:  TSH, A1C today- notified by phlebotomist that [facility] wouldn't cover A1C so it was cancelled Make appt w/ Dr. Buelah Manis for abd pain/possible IBS Recommended weight loss, exercise, decreasing carbs Advised smoking cessation, QuitlineNC offered and declined F/U 28yr for physical, or sooner if needed Mammogram Feb 2016 or sooner if  problems Colonoscopy March 2020 or sooner if problems  Tawnya Crook CNM, [facility] 06/01/2014 2:36 PM

## 2014-06-01 NOTE — Patient Instructions (Addendum)
Call Dr. Buelah Manis for appt for abdominal pain Irritable Bowel Syndrome Irritable bowel syndrome (IBS) is caused by a disturbance of normal bowel function and is a common digestive disorder. You may also hear this condition called spastic colon, mucous colitis, and irritable colon. There is no cure for IBS. However, symptoms often gradually improve or disappear with a good diet, stress management, and medicine. This condition usually appears in late adolescence or early adulthood. Women develop it twice as often as men. CAUSES  After food has been digested and absorbed in the small intestine, waste material is moved into the large intestine, or colon. In the colon, water and salts are absorbed from the undigested products coming from the small intestine. The remaining residue, or fecal material, is held for elimination. Under normal circumstances, gentle, rhythmic contractions of the bowel walls push the fecal material along the colon toward the rectum. In IBS, however, these contractions are irregular and poorly coordinated. The fecal material is either retained too long, resulting in constipation, or expelled too soon, producing diarrhea. SIGNS AND SYMPTOMS  The most common symptom of IBS is abdominal pain. It is often in the lower left side of the abdomen, but it may occur anywhere in the abdomen. The pain comes from spasms of the bowel muscles happening too much and from the buildup of gas and fecal material in the colon. This pain:  Can range from sharp abdominal cramps to a dull, continuous ache.  Often worsens soon after eating.  Is often relieved by having a bowel movement or passing gas. Abdominal pain is usually accompanied by constipation, but it may also produce diarrhea. The diarrhea often occurs right after a meal or upon waking up in the morning. The stools are often soft, watery, and flecked with mucus. Other symptoms of IBS include:  Bloating.  Loss of  appetite.  Heartburn.  Backache.  Dull pain in the arms or shoulders.  Nausea.  Burping.  Vomiting.  Gas. IBS may also cause symptoms that are unrelated to the digestive system, such as:  Fatigue.  Headaches.  Anxiety.  Shortness of breath.  Trouble concentrating.  Dizziness. These symptoms tend to come and go. DIAGNOSIS  The symptoms of IBS may seem like symptoms of other, more serious digestive disorders. Your health care provider may want to perform tests to exclude these disorders.  TREATMENT Many medicines are available to help correct bowel function or relieve bowel spasms and abdominal pain. Among the medicines available are:  Laxatives for severe constipation and to help restore normal bowel habits.  Specific antidiarrheal medicines to treat severe or lasting diarrhea.  Antispasmodic agents to relieve intestinal cramps. Your health care provider may also decide to treat you with a mild tranquilizer or sedative during unusually stressful periods in your life. Your health care provider may also prescribe antidepressant medicine. The use of this medicine has been shown to reduce pain and other symptoms of IBS. Remember that if any medicine is prescribed for you, you should take it exactly as directed. Make sure your health care provider knows how well it worked for you. HOME CARE INSTRUCTIONS   Take all medicines as directed by your health care provider.  Avoid foods that are high in fat or oils, such as heavy cream, butter, frankfurters, sausage, and other fatty meats.  Avoid foods that make you go to the bathroom, such as fruit, fruit juice, and dairy products.  Cut out carbonated drinks, chewing gum, and "gassy" foods such as beans and  cabbage. This may help relieve bloating and burping.  Eat foods with bran, and drink plenty of liquids with the bran foods. This helps relieve constipation.  Keep track of what foods seem to bring on your symptoms.  Avoid  emotionally charged situations or circumstances that produce anxiety.  Start or continue exercising.  Get plenty of rest and sleep. Document Released: 11/25/2005 Document Revised: 11/30/2013 Document Reviewed: 07/15/2008 [facility] Patient Information 2015 [place], [place]. This information is not intended to replace advice given to you by your health care provider. Make sure you discuss any questions you have with your health care provider.

## 2014-06-02 LAB — TSH: TSH: 2.112 u[IU]/mL (ref 0.350–4.500)

## 2014-07-12 ENCOUNTER — Encounter: Payer: Self-pay | Admitting: Family Medicine

## 2014-07-12 ENCOUNTER — Ambulatory Visit (INDEPENDENT_AMBULATORY_CARE_PROVIDER_SITE_OTHER): Payer: Medicare Other | Admitting: Family Medicine

## 2014-07-12 VITALS — BP 138/82 | HR 64 | Temp 97.9°F | Resp 12 | Ht 64.0 in | Wt 202.0 lb

## 2014-07-12 DIAGNOSIS — E669 Obesity, unspecified: Secondary | ICD-10-CM

## 2014-07-12 DIAGNOSIS — Z720 Tobacco use: Secondary | ICD-10-CM

## 2014-07-12 DIAGNOSIS — F17200 Nicotine dependence, unspecified, uncomplicated: Secondary | ICD-10-CM

## 2014-07-12 DIAGNOSIS — K589 Irritable bowel syndrome without diarrhea: Secondary | ICD-10-CM

## 2014-07-12 DIAGNOSIS — I10 Essential (primary) hypertension: Secondary | ICD-10-CM

## 2014-07-12 MED ORDER — DICYCLOMINE HCL 10 MG PO CAPS: 10.0000 mg | ORAL_CAPSULE | Freq: Three times a day (TID) | ORAL | Status: DC

## 2014-07-12 NOTE — Progress Notes (Signed)
Patient ID: Kathy Barr, female   DOB: Sep 30, 1956, 58 y.o.   MRN: [id number]   Subjective:    Patient ID: Kathy Barr, female    DOB: [birth year], 58 y.o.   MRN: [id number]  Patient presents for 6 month F/U  Patient here follow chronic medical problems. She was seen by gastroenterology though it is not quite clear that she talk to them about her bowel changes in the ear no bowel syndrome she continues to have bloating and everything she eats once for her. She was seen by GYN as well secondary to back pain and pelvic pain and they cannot find any true problem with exception of vaginal dryness since her hysterectomy. She still being followed I her back surgeon and we'll see orthopedics soon regarding her knees. Her medications were reviewed she's tolerating her blood pressure medicine without difficulty.   Review Of Systems:  GEN- denies fatigue, fever, weight loss,weakness, recent illness HEENT- denies eye drainage, change in vision, nasal discharge, CVS- denies chest pain, palpitations RESP- denies SOB, cough, wheeze ABD- denies N/V, change in stools, abd pain GU- denies dysuria, hematuria, dribbling, incontinence MSK- + joint pain, muscle aches, injury Neuro- denies headache, dizziness, syncope, seizure activity       Objective:    BP 138/82  Pulse 64  Temp(Src) 97.9 F (36.6 C) (Oral)  Resp 12  Ht 5\' 4"  (1.626 m)  Wt 202 lb (91.627 kg)  BMI 34.66 kg/m2 GEN- NAD, alert and oriented x3 HEENT- PERRL, EOMI, non injected sclera, pink conjunctiva, MMM, oropharynx clear CVS- RRR, no murmur RESP-CTAB ABD-NABS,soft,mild TTP lower quadrants,ND, no rebound, no guardng EXT- No edema,  Pulses- Radial 2+        Assessment & Plan:      Problem List Items Addressed This Visit   Tobacco user   Obesity, unspecified   IBS (irritable bowel syndrome) - Primary   Relevant Medications      dicyclomine (BENTYL) 10 MG capsule   Essential hypertension, benign      Note: This  dictation was prepared with Dragon dictation along with smaller phrase technology. Any transcriptional errors that result from this process are unintentional.

## 2014-07-12 NOTE — Assessment & Plan Note (Signed)
Continues to cut back now down to 2 cig a day

## 2014-07-12 NOTE — Assessment & Plan Note (Signed)
Trial of Bentyl with meals, discussed dietary changes F/U GI if not improved

## 2014-07-12 NOTE — Assessment & Plan Note (Signed)
Well controlled, no changes 

## 2014-07-12 NOTE — Patient Instructions (Signed)
Try the bentyl with meals F/U 3 months

## 2014-08-20 ENCOUNTER — Other Ambulatory Visit: Payer: Self-pay | Admitting: Family Medicine

## 2014-08-20 NOTE — Telephone Encounter (Signed)
Refill appropriate and filled per protocol. 

## 2014-10-12 ENCOUNTER — Ambulatory Visit: Payer: Medicare Other | Admitting: Family Medicine

## 2014-11-01 ENCOUNTER — Other Ambulatory Visit (HOSPITAL_COMMUNITY): Payer: Self-pay | Admitting: Orthopaedic Surgery

## 2014-11-15 ENCOUNTER — Other Ambulatory Visit (HOSPITAL_COMMUNITY): Payer: PRIVATE HEALTH INSURANCE

## 2014-11-25 ENCOUNTER — Encounter (HOSPITAL_COMMUNITY): Admission: RE | Payer: Self-pay | Source: Ambulatory Visit

## 2014-11-25 ENCOUNTER — Inpatient Hospital Stay (HOSPITAL_COMMUNITY)
Admission: RE | Admit: 2014-11-25 | Payer: PRIVATE HEALTH INSURANCE | Source: Ambulatory Visit | Admitting: Orthopaedic Surgery

## 2014-11-25 SURGERY — ANTERIOR CERVICAL DECOMPRESSION/DISCECTOMY FUSION 2 LEVELS
Anesthesia: General

## 2014-12-12 ENCOUNTER — Other Ambulatory Visit: Payer: Self-pay | Admitting: Family Medicine

## 2014-12-12 DIAGNOSIS — Z1231 Encounter for screening mammogram for malignant neoplasm of breast: Secondary | ICD-10-CM

## 2014-12-15 DIAGNOSIS — M5442 Lumbago with sciatica, left side: Secondary | ICD-10-CM | POA: Diagnosis not present

## 2015-01-05 DIAGNOSIS — M47817 Spondylosis without myelopathy or radiculopathy, lumbosacral region: Secondary | ICD-10-CM | POA: Diagnosis not present

## 2015-01-10 DIAGNOSIS — M4316 Spondylolisthesis, lumbar region: Secondary | ICD-10-CM | POA: Diagnosis not present

## 2015-01-10 DIAGNOSIS — M545 Low back pain: Secondary | ICD-10-CM | POA: Diagnosis not present

## 2015-01-10 DIAGNOSIS — M4806 Spinal stenosis, lumbar region: Secondary | ICD-10-CM | POA: Diagnosis not present

## 2015-01-10 DIAGNOSIS — M47816 Spondylosis without myelopathy or radiculopathy, lumbar region: Secondary | ICD-10-CM | POA: Diagnosis not present

## 2015-01-12 DIAGNOSIS — M545 Low back pain: Secondary | ICD-10-CM | POA: Diagnosis not present

## 2015-01-13 ENCOUNTER — Ambulatory Visit (INDEPENDENT_AMBULATORY_CARE_PROVIDER_SITE_OTHER): Payer: Medicare Other | Admitting: Family Medicine

## 2015-01-13 ENCOUNTER — Encounter: Payer: Self-pay | Admitting: Family Medicine

## 2015-01-13 VITALS — BP 128/78 | HR 76 | Temp 97.6°F | Resp 14 | Ht 64.0 in | Wt 202.0 lb

## 2015-01-13 DIAGNOSIS — E669 Obesity, unspecified: Secondary | ICD-10-CM | POA: Diagnosis not present

## 2015-01-13 DIAGNOSIS — Z720 Tobacco use: Secondary | ICD-10-CM

## 2015-01-13 DIAGNOSIS — M5136 Other intervertebral disc degeneration, lumbar region: Secondary | ICD-10-CM

## 2015-01-13 DIAGNOSIS — M159 Polyosteoarthritis, unspecified: Secondary | ICD-10-CM

## 2015-01-13 DIAGNOSIS — I10 Essential (primary) hypertension: Secondary | ICD-10-CM

## 2015-01-13 LAB — CBC WITH DIFFERENTIAL/PLATELET
BASOS ABS: 0 10*3/uL (ref 0.0–0.1)
BASOS PCT: 0 % (ref 0–1)
EOS ABS: 0.1 10*3/uL (ref 0.0–0.7)
EOS PCT: 2 % (ref 0–5)
HEMATOCRIT: 41.1 % (ref 36.0–46.0)
Hemoglobin: 13.9 g/dL (ref 12.0–15.0)
Lymphocytes Relative: 26 % (ref 12–46)
Lymphs Abs: 1.8 10*3/uL (ref 0.7–4.0)
MCH: 28 pg (ref 26.0–34.0)
MCHC: 33.8 g/dL (ref 30.0–36.0)
MCV: 82.7 fL (ref 78.0–100.0)
MONO ABS: 0.7 10*3/uL (ref 0.1–1.0)
MPV: 9.2 fL (ref 8.6–12.4)
Monocytes Relative: 10 % (ref 3–12)
Neutro Abs: 4.2 10*3/uL (ref 1.7–7.7)
Neutrophils Relative %: 62 % (ref 43–77)
PLATELETS: 240 10*3/uL (ref 150–400)
RBC: 4.97 MIL/uL (ref 3.87–5.11)
RDW: 14.1 % (ref 11.5–15.5)
WBC: 6.8 10*3/uL (ref 4.0–10.5)

## 2015-01-13 LAB — COMPREHENSIVE METABOLIC PANEL
ALK PHOS: 75 U/L (ref 39–117)
ALT: 19 U/L (ref 0–35)
AST: 21 U/L (ref 0–37)
Albumin: 4.3 g/dL (ref 3.5–5.2)
BUN: 6 mg/dL (ref 6–23)
CO2: 24 mEq/L (ref 19–32)
Calcium: 9.5 mg/dL (ref 8.4–10.5)
Chloride: 102 mEq/L (ref 96–112)
Creat: 0.66 mg/dL (ref 0.50–1.10)
GLUCOSE: 76 mg/dL (ref 70–99)
Potassium: 3.6 mEq/L (ref 3.5–5.3)
Sodium: 141 mEq/L (ref 135–145)
TOTAL PROTEIN: 7.2 g/dL (ref 6.0–8.3)
Total Bilirubin: 0.9 mg/dL (ref 0.2–1.2)

## 2015-01-13 LAB — LIPID PANEL
Cholesterol: 162 mg/dL (ref 0–200)
HDL: 51 mg/dL (ref >39)
LDL Cholesterol: 81 mg/dL (ref 0–99)
Total CHOL/HDL Ratio: 3.2 Ratio
Triglycerides: 148 mg/dL (ref <150)
VLDL: 30 mg/dL (ref 0–40)

## 2015-01-13 MED ORDER — CYCLOBENZAPRINE HCL 10 MG PO TABS: 10.0000 mg | ORAL_TABLET | Freq: Three times a day (TID) | ORAL | Status: DC | PRN

## 2015-01-13 MED ORDER — GABAPENTIN 300 MG PO CAPS: 300.0000 mg | ORAL_CAPSULE | Freq: Three times a day (TID) | ORAL | Status: DC

## 2015-01-13 NOTE — Assessment & Plan Note (Signed)
Imaging shows degenerative disc disease worse on the right side compared to the left. She's had epidural injections with minimal improvement. I will restart her back on gabapentin She is considering making a follow-up with her neurosurgeon but I do not see anything surgical and noted on the MRI at this time

## 2015-01-13 NOTE — Assessment & Plan Note (Signed)
Her blood pressure is well controlled continue current medications Fasting labs done today

## 2015-01-13 NOTE — Patient Instructions (Addendum)
Vitamin D [phone] and Calcium 1200mg  Start the gabapentin at bedtime only for 1 week, then 1 twice a day for 2 weeks, then `1 tablet three times a day  Continue blood pressure medication F/U 6 months

## 2015-01-13 NOTE — Assessment & Plan Note (Signed)
Discussed dietary changes, importance of weight loss on her back and joints

## 2015-01-13 NOTE — Progress Notes (Signed)
Patient ID: Kathy Barr, female   DOB: 05-19-1956, 59 y.o.   MRN: [id number]   Subjective:    Patient ID: Kathy Barr, female    DOB: 1956-03-01, 59 y.o.   MRN: [id number]  Patient presents for 3 month F/U patient here to follow-up chronic medical problems. She is still being followed by orthopedics and Dr. Lorin Mercy for her chronicback pain and leg pain. She was on gabapentin in the past for back pain, he did have a recent MRI done however there was nothing significant that needed surgical intervention therefore her surgery was canceled per report. She has not had any significant weight change  Hypertension she has been taking her blood pressure medicine as prescribed and has not had any hypotensive episodes of chest pain or shortness of breath  Review Of Systems:  GEN- denies fatigue, fever, weight loss,weakness, recent illness HEENT- denies eye drainage, change in vision, nasal discharge, CVS- denies chest pain, palpitations RESP- denies SOB, cough, wheeze ABD- denies N/V, change in stools, abd pain GU- denies dysuria, hematuria, dribbling, incontinence MSK-+joint pain, muscle aches, injury Neuro- denies headache, dizziness, syncope, seizure activity       Objective:    BP 128/78 mmHg  Pulse 76  Temp(Src) 97.6 F (36.4 C) (Oral)  Resp 14  Ht 5\' 4"  (1.626 m)  Wt 202 lb (91.627 kg)  BMI 34.66 kg/m2 GEN- NAD, alert and oriented x3 HEENT- PERRL, EOMI, non injected sclera, pink conjunctiva, MMM, oropharynx clear CVS- RRR, no murmur RESP-CTAB EXT- No edema Pulses- Radial 2+        Assessment & Plan:      Problem List Items Addressed This Visit      Unprioritized   Tobacco user   Obesity   Generalized OA   Relevant Medications   cyclobenzaprine (FLEXERIL) tablet   Essential hypertension, benign - Primary   Relevant Orders   CBC with Differential/Platelet   Comprehensive metabolic panel   Lipid panel      Note: This dictation was prepared with Dragon dictation  along with smaller phrase technology. Any transcriptional errors that result from this process are unintentional.

## 2015-01-16 ENCOUNTER — Encounter: Payer: Self-pay | Admitting: *Deleted

## 2015-01-20 ENCOUNTER — Ambulatory Visit (HOSPITAL_COMMUNITY)
Admission: RE | Admit: 2015-01-20 | Discharge: 2015-01-20 | Disposition: A | Discharge status: Home / Self Care | Payer: Medicare Other | Source: Ambulatory Visit | Attending: Family Medicine | Admitting: Family Medicine

## 2015-01-20 DIAGNOSIS — Z1231 Encounter for screening mammogram for malignant neoplasm of breast: Secondary | ICD-10-CM | POA: Diagnosis not present

## 2015-01-27 ENCOUNTER — Telehealth: Payer: Self-pay | Admitting: *Deleted

## 2015-01-27 MED ORDER — TIZANIDINE HCL 4 MG PO CAPS: 4.0000 mg | ORAL_CAPSULE | Freq: Three times a day (TID) | ORAL | Status: DC | PRN

## 2015-01-27 NOTE — Telephone Encounter (Signed)
Received letter from patient insurance.   Reports that Flexeril is no longer covered.   MD made aware and new orders obtained to change medication to Zanaflex.   Call placed to patient and patient made aware.   Prescription sent to pharmacy.

## 2015-02-09 ENCOUNTER — Other Ambulatory Visit: Payer: Self-pay | Admitting: *Deleted

## 2015-02-09 MED ORDER — TIZANIDINE HCL 4 MG PO TABS: 4.0000 mg | ORAL_TABLET | Freq: Four times a day (QID) | ORAL | Status: DC | PRN

## 2015-02-27 ENCOUNTER — Encounter: Payer: Medicare Other | Admitting: Family Medicine

## 2015-05-15 ENCOUNTER — Ambulatory Visit: Payer: Medicare Other | Admitting: Family Medicine

## 2015-06-05 ENCOUNTER — Ambulatory Visit (INDEPENDENT_AMBULATORY_CARE_PROVIDER_SITE_OTHER): Payer: Medicare Other | Admitting: Women's Health

## 2015-06-05 ENCOUNTER — Encounter: Payer: Self-pay | Admitting: Women's Health

## 2015-06-05 VITALS — BP 102/62 | HR 64 | Ht 63.75 in | Wt 198.0 lb

## 2015-06-05 DIAGNOSIS — B9689 Other specified bacterial agents as the cause of diseases classified elsewhere: Secondary | ICD-10-CM

## 2015-06-05 DIAGNOSIS — R300 Dysuria: Secondary | ICD-10-CM | POA: Diagnosis not present

## 2015-06-05 DIAGNOSIS — E669 Obesity, unspecified: Secondary | ICD-10-CM

## 2015-06-05 DIAGNOSIS — Z1211 Encounter for screening for malignant neoplasm of colon: Secondary | ICD-10-CM

## 2015-06-05 DIAGNOSIS — Z01419 Encounter for gynecological examination (general) (routine) without abnormal findings: Secondary | ICD-10-CM | POA: Diagnosis not present

## 2015-06-05 DIAGNOSIS — N952 Postmenopausal atrophic vaginitis: Secondary | ICD-10-CM | POA: Insufficient documentation

## 2015-06-05 DIAGNOSIS — F17200 Nicotine dependence, unspecified, uncomplicated: Secondary | ICD-10-CM

## 2015-06-05 DIAGNOSIS — Z1212 Encounter for screening for malignant neoplasm of rectum: Secondary | ICD-10-CM

## 2015-06-05 DIAGNOSIS — N760 Acute vaginitis: Secondary | ICD-10-CM | POA: Diagnosis not present

## 2015-06-05 DIAGNOSIS — Z113 Encounter for screening for infections with a predominantly sexual mode of transmission: Secondary | ICD-10-CM | POA: Diagnosis not present

## 2015-06-05 DIAGNOSIS — A499 Bacterial infection, unspecified: Secondary | ICD-10-CM | POA: Diagnosis not present

## 2015-06-05 DIAGNOSIS — N949 Unspecified condition associated with female genital organs and menstrual cycle: Secondary | ICD-10-CM

## 2015-06-05 DIAGNOSIS — L292 Pruritus vulvae: Secondary | ICD-10-CM

## 2015-06-05 LAB — HEMOCCULT GUIAC POC 1CARD (OFFICE): Fecal Occult Blood, POC: NEGATIVE

## 2015-06-05 LAB — POCT URINALYSIS DIPSTICK
Glucose, UA: NEGATIVE
Ketones, UA: NEGATIVE
Leukocytes, UA: NEGATIVE
NITRITE UA: NEGATIVE
Protein, UA: NEGATIVE
RBC UA: NEGATIVE

## 2015-06-05 LAB — POCT WET PREP (WET MOUNT): CLUE CELLS WET PREP WHIFF POC: POSITIVE

## 2015-06-05 MED ORDER — METRONIDAZOLE 500 MG PO TABS: 500.0000 mg | ORAL_TABLET | Freq: Two times a day (BID) | ORAL | Status: DC

## 2015-06-05 NOTE — Progress Notes (Signed)
Patient ID: Kathy Barr, female   DOB: 09-19-1956, 59 y.o.   MRN: [id number] Subjective:   Kathy Barr is a 59 y.o. G56P1101 Caucasian female here for a routine well-woman exam.  No LMP recorded. Patient has had a hysterectomy.    Current complaints: vulvovaginal burning/itching x 1 week, no abnormal d/c or odor. Uses Luvena occasionally.  PCP: Dr. Buelah Manis       Does not desire labs, done by Dr. Buelah Manis  Social History: Sexual: heterosexual Monogamous w current partner Smokes 1-2 cigs/day and is trying to cut out completely. Down from 1/2ppd last year Beer only when she feels she needs to flush kidneys out, very rarely  The following portions of the patient's history were reviewed and updated as appropriate: allergies, current medications, past family history, past medical history, past social history, past surgical history and problem list.  Past Medical History Past Medical History  Diagnosis Date  . Hypertension   . Fracture 2013    RLE  . GERD (gastroesophageal reflux disease)     OTC  . Shortness of breath     "not since I quit smoking" (03/18/2013)  . Carpal tunnel syndrome     "right" (03/18/2013)  . Seizures 1960's; 1978    "when I was little; when I was 7 months pregnant" (03/18/2013)  . Arthritis     "all over" (03/18/2013)  . Osteoarthritis of left hip   . IBS (irritable bowel syndrome)     Past Surgical History Past Surgical History  Procedure Laterality Date  . Foot arthrotomy Right 1990's    pin, after removing a bone   . Total hip arthroplasty  06/30/2012    Procedure: TOTAL HIP ARTHROPLASTY;  Surgeon: Jessy Oto, MD;  Location: [place];  Service: Orthopedics;  Laterality: Left;  Left total hip replacement with metal and polypropylene pore coated implants  . Joint replacement Left     hip  . Total hip arthroplasty Right 03/16/2013    Procedure: RIGHT TOTAL HIP ARTHROPLASTY- right ;  Surgeon: Jessy Oto, MD;  Location: [place];  Service: Orthopedics;  Laterality:  Right;  . Cholecystectomy  2003  . Tubal ligation  1978  . Vaginal hysterectomy  ~ 2003  . Anterior cervical decomp/discectomy fusion  ?2003  . Colonoscopy N/A 02/17/2014    Procedure: COLONOSCOPY;  Surgeon: Rogene [place], MD;  [facility];  Service: Endoscopy;  Laterality: N/A;  145-moved to 10:30 Ann to notify pt  . Pinched nerve      Gynecologic History G2P1101, 13month IUFD, both vag del  No LMP recorded. Patient has had a hysterectomy. Contraception: status post hysterectomy Last Pap: years ago. Results were: normal Last mammogram: 01/20/15. Results were: normal Last TCS: March 2015, 1 polyp>tubular adenoma- no malignancy, recommended TCS in 56yrs  Obstetric History OB History  No data available    Current Medications Current Outpatient Prescriptions on File Prior to Visit  Medication Sig Dispense Refill  . valsartan-hydrochlorothiazide (DIOVAN-HCT) 160-25 MG per tablet TAKE ONE TABLET BY MOUTH ONCE DAILY BEFORE  BREAKFAST 90 tablet 3  . cholecalciferol (VITAMIN D) 400 UNITS TABS Take 400 Units by mouth daily.    Marland Kitchen dicyclomine (BENTYL) 10 MG capsule Take 1 capsule (10 mg total) by mouth 3 (three) times daily before meals. (Patient not taking: Reported on 01/13/2015) 90 capsule 2  . gabapentin (NEURONTIN) 300 MG capsule Take 1 capsule (300 mg total) by mouth 3 (three) times daily. (Patient not taking: Reported on  06/05/2015) 90 capsule 3  . tiZANidine (ZANAFLEX) 4 MG tablet Take 1 tablet (4 mg total) by mouth every 6 (six) hours as needed for muscle spasms. (Patient not taking: Reported on 06/05/2015) 30 tablet 0   No current facility-administered medications on file prior to visit.    Review of Systems Patient denies any headaches, blurred vision, shortness of breath, chest pain, abdominal pain, problems with bowel movements, urination, or intercourse.  Objective:  BP 102/62 mmHg  Pulse 64  Ht 5' 3.75" (1.619 m)  Wt 198 lb (89.812 kg)  BMI 34.26 kg/m2 Physical  Exam  General:  Well developed, well nourished, no acute distress. She is alert and oriented x3. Skin:  Warm and dry Neck:  Midline trachea, no thyromegaly or nodules Cardiovascular: Regular rate and rhythm, no murmur heard Lungs:  Effort normal, all lung fields clear to auscultation bilaterally Breasts:  No dominant palpable mass, retraction, or nipple discharge Abdomen:  Soft, non tender, no hepatosplenomegaly or masses Pelvic:  External genitalia is normal in appearance.  The vagina is atrophic and dry. The cervix is surgically absent.  Thin prep pap: n/a. Uterus is surgically absent.  No adnexal masses or tenderness noted. Unable to palpate either ovary- thinks she had unilateral SO.  Rectal: Good sphincter tone, no polyps, or hemorrhoids felt.  Hemoccult: neg Extremities:  No swelling or varicosities noted Psych:  She has a normal mood and affect  Results for orders placed or performed in visit on 06/05/15 (from the past 24 hour(s))  POCT urinalysis dipstick     Status: None   Collection Time: 06/05/15  9:15 AM  Result Value Ref Range   Color, UA     Clarity, UA     Glucose, UA neg    Bilirubin, UA     Ketones, UA neg    Spec Grav, UA     Blood, UA neg    pH, UA     Protein, UA neg    Urobilinogen, UA     Nitrite, UA neg    Leukocytes, UA Negative Negative  POCT Wet Prep [place])     Status: Abnormal   Collection Time: 06/05/15  9:46 AM  Result Value Ref Range   Source Wet Prep POC vaginal    WBC, Wet Prep HPF POC none    Bacteria Wet Prep HPF POC None (A) Few   BACTERIA WET PREP MORPHOLOGY POC     Clue Cells Wet Prep HPF POC Few (A) None   Clue Cells Wet Prep Whiff POC Positive Whiff    Yeast Wet Prep HPF POC None    KOH Wet Prep POC     Trichomonas Wet Prep HPF POC none   POCT occult blood stool     Status: Normal   Collection Time: 06/05/15  9:47 AM  Result Value Ref Range   Fecal Occult Blood, POC Negative Negative   Card #1 Date 06/05/15    Card #2 Fecal  Occult Blod, POC     Card #2 Date     Card #3 Fecal Occult Blood, POC     Card #3 Date      Assessment:   Healthy well-woman exam Smoker Obese BV Atrophic vulvovaginitis  Plan:  Rx metronidazole bid x 7d for BV, no etoh Luvena more often to help w/ dry tissues Will also send gc/ct from urine Recommended weight loss Advised complete smoking cessation F/U 49yr for physical, or sooner if needed Mammogram 65yr or sooner if problems  Colonoscopy 2020 per last GI recommendation, or sooner if problems  Tawnya Crook CNM, [facility] 06/05/2015 9:47 AM

## 2015-06-05 NOTE — Patient Instructions (Signed)
Samul Dada, [place]  Do not drink alcohol with the metronidazole

## 2015-06-07 LAB — GC/CHLAMYDIA PROBE AMP
Chlamydia trachomatis, NAA: NEGATIVE
Neisseria gonorrhoeae by PCR: NEGATIVE

## 2015-07-03 ENCOUNTER — Encounter: Payer: Medicare Other | Admitting: Physician Assistant

## 2015-07-19 ENCOUNTER — Encounter: Payer: Medicare Other | Admitting: Physician Assistant

## 2015-09-01 ENCOUNTER — Other Ambulatory Visit: Payer: Self-pay | Admitting: Family Medicine

## 2015-09-04 NOTE — Telephone Encounter (Signed)
Medication filled x1 with no refills.   Requires office visit before any further refills can be given.   Letter sent.  

## 2015-09-29 ENCOUNTER — Emergency Department (HOSPITAL_COMMUNITY)
Admission: EM | Admit: 2015-09-29 | Discharge: 2015-09-29 | Disposition: A | Discharge status: Home / Self Care | Payer: Medicare Other | Attending: Emergency Medicine | Admitting: Emergency Medicine

## 2015-09-29 ENCOUNTER — Encounter (HOSPITAL_COMMUNITY): Payer: Self-pay | Admitting: *Deleted

## 2015-09-29 DIAGNOSIS — Z720 Tobacco use: Secondary | ICD-10-CM | POA: Diagnosis not present

## 2015-09-29 DIAGNOSIS — I10 Essential (primary) hypertension: Secondary | ICD-10-CM | POA: Insufficient documentation

## 2015-09-29 DIAGNOSIS — G40909 Epilepsy, unspecified, not intractable, without status epilepticus: Secondary | ICD-10-CM | POA: Diagnosis not present

## 2015-09-29 DIAGNOSIS — K589 Irritable bowel syndrome without diarrhea: Secondary | ICD-10-CM | POA: Insufficient documentation

## 2015-09-29 DIAGNOSIS — M79672 Pain in left foot: Secondary | ICD-10-CM

## 2015-09-29 DIAGNOSIS — Z792 Long term (current) use of antibiotics: Secondary | ICD-10-CM | POA: Diagnosis not present

## 2015-09-29 DIAGNOSIS — Z8781 Personal history of (healed) traumatic fracture: Secondary | ICD-10-CM | POA: Diagnosis not present

## 2015-09-29 DIAGNOSIS — Z79899 Other long term (current) drug therapy: Secondary | ICD-10-CM | POA: Insufficient documentation

## 2015-09-29 DIAGNOSIS — G8929 Other chronic pain: Secondary | ICD-10-CM | POA: Diagnosis not present

## 2015-09-29 MED ORDER — NAPROXEN 500 MG PO TABS: 500.0000 mg | ORAL_TABLET | Freq: Two times a day (BID) | ORAL | Status: DC

## 2015-09-29 NOTE — ED Provider Notes (Signed)
CSN: [id number]     Arrival date & time 09/29/15  2979 History  By signing my name below, I, [name], attest that this documentation has been prepared under the direction and in the presence of Ripley Fraise, MD. Electronically Signed: [name], ED Scribe. 09/29/2015. 8:49 AM.   Chief Complaint  Patient presents with  . Foot Pain   The history is provided by the patient. No language interpreter was used.   HPI Comments: Kathy Barr is a 58 y.o. female, with a PMhx of HTN and arthritis, who presents to the Emergency Department complaining of constant, chronic left heel pain that intermittently radiates posteriorly up her left ankle that has been present for several months. Pt has been ambulatory since onset but explains she is not on her feet for a significant amount of time throughout the day. Her pain is exacerbated with weight bearing and driving. She denies any injury or trauma attributable to the pain, fevers, vomiting, swelling in LLE, numbness or weakness. Pt additionally denies a prior similar pain in left foot. No overlying skin changes to affected area. No PMhx of DM. PCP: Dr. Buelah Manis.  Past Medical History  Diagnosis Date  . Hypertension   . Fracture 2013    RLE  . GERD (gastroesophageal reflux disease)     OTC  . Shortness of breath     "not since I quit smoking" (03/18/2013)  . Carpal tunnel syndrome     "right" (03/18/2013)  . Seizures ([place]) 1960's; 1978    "when I was little; when I was 7 months pregnant" (03/18/2013)  . Arthritis     "all over" (03/18/2013)  . Osteoarthritis of left hip   . IBS (irritable bowel syndrome)    Past Surgical History  Procedure Laterality Date  . Foot arthrotomy Right 1990's    pin, after removing a bone   . Total hip arthroplasty  06/30/2012    Procedure: TOTAL HIP ARTHROPLASTY;  Surgeon: Jessy Oto, MD;  Location: [place];  Service: Orthopedics;  Laterality: Left;  Left total hip replacement with metal and polypropylene  pore coated implants  . Joint replacement Left     hip  . Total hip arthroplasty Right 03/16/2013    Procedure: RIGHT TOTAL HIP ARTHROPLASTY- right ;  Surgeon: Jessy Oto, MD;  Location: [place];  Service: Orthopedics;  Laterality: Right;  . Cholecystectomy  2003  . Tubal ligation  1978  . Vaginal hysterectomy  ~ 2003  . Anterior cervical decomp/discectomy fusion  ?2003  . Colonoscopy N/A 02/17/2014    Procedure: COLONOSCOPY;  Surgeon: Rogene [place], MD;  [facility];  Service: Endoscopy;  Laterality: N/A;  145-moved to 10:30 Ann to notify pt  . Pinched nerve     Family History  Problem Relation Age of Onset  . Hypertension Father   . Diabetes Father   . Hypertension Sister   . Diabetes Sister   . Hypertension Brother   . Diabetes Brother   . Diabetes Mother   . Hypertension Mother   . Hyperlipidemia Mother   . COPD Brother   . Heart disease Brother   . Hypertension Brother    Social History  Substance Use Topics  . Smoking status: Current Every Day Smoker -- 0.25 packs/day for 25 years    Types: Cigarettes  . Smokeless tobacco: Never Used     Comment: 1-2 cigs per day  . Alcohol Use: No   OB History    No  data available     Review of Systems  Constitutional: Negative for fever.  Cardiovascular: Negative for leg swelling.  Gastrointestinal: Negative for vomiting.  Musculoskeletal: Positive for arthralgias (left heel). Negative for gait problem.  Skin: Negative for color change and wound.  Neurological: Negative for weakness and numbness.  All other systems reviewed and are negative.  Allergies  Review of patient's allergies indicates no known allergies.  Home Medications   Prior to Admission medications   Medication Sig Start Date End Date Taking? Authorizing Provider  cholecalciferol (VITAMIN D) 400 UNITS TABS Take 400 Units by mouth daily.    Historical Provider, MD  dicyclomine (BENTYL) 10 MG capsule Take 1 capsule (10 mg total) by mouth 3  (three) times daily before meals. Patient not taking: Reported on 01/13/2015 07/12/14   Alycia Rossetti, MD  gabapentin (NEURONTIN) 300 MG capsule Take 1 capsule (300 mg total) by mouth 3 (three) times daily. Patient not taking: Reported on 06/05/2015 01/13/15   Alycia Rossetti, MD  metroNIDAZOLE (FLAGYL) 500 MG tablet Take 1 tablet (500 mg total) by mouth 2 (two) times daily. 06/05/15   Roma Schanz, CNM  tiZANidine (ZANAFLEX) 4 MG tablet Take 1 tablet (4 mg total) by mouth every 6 (six) hours as needed for muscle spasms. Patient not taking: Reported on 06/05/2015 02/09/15   Alycia Rossetti, MD  valsartan-hydrochlorothiazide (DIOVAN-HCT) 160-25 MG per tablet TAKE ONE TABLET BY MOUTH ONCE DAILY BEFORE BREAKFAST 09/04/15   Alycia Rossetti, MD   BP 134/88 mmHg  Pulse 91  Temp(Src) 97.8 F (36.6 C) (Oral)  Resp 16  Ht 5\' 4"  (1.626 m)  Wt 190 lb (86.183 kg)  BMI 32.60 kg/m2  SpO2 95% Physical Exam Nursing notes including past medical history and social history reviewed and considered in documentation CONSTITUTIONAL: Well developed/well nourished HEAD: Normocephalic/atraumatic NECK: supple no meningeal signs CV: S1/S2 noted, no murmurs/rubs/gallops noted LUNGS: Lungs are clear to auscultation bilaterally, no apparent distress ABDOMEN: soft, nontender, no rebound or guarding, bowel sounds noted throughout abdomen NEURO: Pt is awake/alert/appropriate, moves all extremitiesx4.  No facial droop.   EXTREMITIES: pulses normal/equal, full ROM, mild tenderness to heel of left foot, no bruising, no crepitus, no puncture wounds noted, left achilles intact SKIN: warm, color normal PSYCH: no abnormalities of mood noted, alert and oriented to situation  ED Course  Procedures  DIAGNOSTIC STUDIES: Oxygen Saturation is 95% on RA, adequate by my interpretation.    COORDINATION OF CARE: 8:46 AM Discussed treatment plan with pt at bedside and pt agreed to plan. Will order a post-op boot for left foot and  advised pt she can take anti-inflammatories as needed. Podiatry referral also given.   MDM   Final diagnoses:  Left foot pain    Nursing notes including past medical history and social history reviewed and considered in documentation   I, Sharyon Cable, personally performed the services described in this documentation. All medical record entries made by the scribe were at my direction and in my presence.  I have reviewed the chart and discharge instructions and agree that the record reflects my personal performance and is accurate and complete. Sharyon Cable.  09/29/2015. 9:18 AM.       Ripley Fraise, MD 09/29/15 [phone]

## 2015-09-29 NOTE — ED Notes (Signed)
Pt states pain to left foot, stating "heel pain for several months" NAD.

## 2015-10-24 ENCOUNTER — Encounter: Payer: Self-pay | Admitting: Family Medicine

## 2015-10-24 ENCOUNTER — Ambulatory Visit (INDEPENDENT_AMBULATORY_CARE_PROVIDER_SITE_OTHER): Payer: Medicare Other | Admitting: Family Medicine

## 2015-10-24 VITALS — BP 140/88 | HR 86 | Temp 97.8°F | Resp 18 | Ht 64.0 in | Wt 202.0 lb

## 2015-10-24 DIAGNOSIS — Z1321 Encounter for screening for nutritional disorder: Secondary | ICD-10-CM | POA: Diagnosis not present

## 2015-10-24 DIAGNOSIS — E669 Obesity, unspecified: Secondary | ICD-10-CM | POA: Diagnosis not present

## 2015-10-24 DIAGNOSIS — F17200 Nicotine dependence, unspecified, uncomplicated: Secondary | ICD-10-CM

## 2015-10-24 DIAGNOSIS — M159 Polyosteoarthritis, unspecified: Secondary | ICD-10-CM

## 2015-10-24 DIAGNOSIS — Z13820 Encounter for screening for osteoporosis: Secondary | ICD-10-CM | POA: Diagnosis not present

## 2015-10-24 DIAGNOSIS — Z720 Tobacco use: Secondary | ICD-10-CM | POA: Diagnosis not present

## 2015-10-24 DIAGNOSIS — I10 Essential (primary) hypertension: Secondary | ICD-10-CM

## 2015-10-24 DIAGNOSIS — Z0000 Encounter for general adult medical examination without abnormal findings: Secondary | ICD-10-CM | POA: Diagnosis not present

## 2015-10-24 DIAGNOSIS — M7501 Adhesive capsulitis of right shoulder: Secondary | ICD-10-CM

## 2015-10-24 DIAGNOSIS — E559 Vitamin D deficiency, unspecified: Secondary | ICD-10-CM | POA: Diagnosis not present

## 2015-10-24 DIAGNOSIS — M7500 Adhesive capsulitis of unspecified shoulder: Secondary | ICD-10-CM | POA: Insufficient documentation

## 2015-10-24 LAB — COMPREHENSIVE METABOLIC PANEL
ALBUMIN: 3.7 g/dL (ref 3.6–5.1)
ALT: 20 U/L (ref 6–29)
AST: 20 U/L (ref 10–35)
Alkaline Phosphatase: 66 U/L (ref 33–130)
BUN: 6 mg/dL — ABNORMAL LOW (ref 7–25)
CALCIUM: 8.8 mg/dL (ref 8.6–10.4)
CHLORIDE: 109 mmol/L (ref 98–110)
CO2: 23 mmol/L (ref 20–31)
CREATININE: 0.6 mg/dL (ref 0.50–0.99)
Glucose, Bld: 88 mg/dL (ref 70–99)
POTASSIUM: 3.7 mmol/L (ref 3.5–5.3)
SODIUM: 142 mmol/L (ref 135–146)
TOTAL PROTEIN: 6.5 g/dL (ref 6.1–8.1)
Total Bilirubin: 1 mg/dL (ref 0.2–1.2)

## 2015-10-24 LAB — CBC WITH DIFFERENTIAL/PLATELET
Basophils Absolute: 0 10*3/uL (ref 0.0–0.1)
Basophils Relative: 0 % (ref 0–1)
Eosinophils Absolute: 0.2 10*3/uL (ref 0.0–0.7)
Eosinophils Relative: 3 % (ref 0–5)
HCT: 37.2 % (ref 36.0–46.0)
HEMOGLOBIN: 12.3 g/dL (ref 12.0–15.0)
LYMPHS PCT: 26 % (ref 12–46)
Lymphs Abs: 1.3 10*3/uL (ref 0.7–4.0)
MCH: 27.5 pg (ref 26.0–34.0)
MCHC: 33.1 g/dL (ref 30.0–36.0)
MCV: 83 fL (ref 78.0–100.0)
MONO ABS: 0.4 10*3/uL (ref 0.1–1.0)
MONOS PCT: 8 % (ref 3–12)
MPV: 9.1 fL (ref 8.6–12.4)
NEUTROS PCT: 63 % (ref 43–77)
Neutro Abs: 3.2 10*3/uL (ref 1.7–7.7)
Platelets: 181 10*3/uL (ref 150–400)
RBC: 4.48 MIL/uL (ref 3.87–5.11)
RDW: 13.8 % (ref 11.5–15.5)
WBC: 5.1 10*3/uL (ref 4.0–10.5)

## 2015-10-24 LAB — LIPID PANEL
CHOLESTEROL: 152 mg/dL (ref 125–200)
HDL: 54 mg/dL (ref >=46)
LDL Cholesterol: 66 mg/dL (ref <130)
Total CHOL/HDL Ratio: 2.8 Ratio (ref <=5.0)
Triglycerides: 159 mg/dL — ABNORMAL HIGH (ref <150)
VLDL: 32 mg/dL — AB (ref <30)

## 2015-10-24 MED ORDER — HYDROCODONE-ACETAMINOPHEN 5-325 MG PO TABS: 1.0000 | ORAL_TABLET | Freq: Four times a day (QID) | ORAL | Status: DC | PRN

## 2015-10-24 MED ORDER — VALSARTAN-HYDROCHLOROTHIAZIDE 160-25 MG PO TABS: ORAL_TABLET | ORAL | Status: DC

## 2015-10-24 MED ORDER — ZOSTER VACCINE LIVE 19400 UNT/0.65ML ~~LOC~~ SOLR: 0.6500 mL | Freq: Once | SUBCUTANEOUS | Status: DC

## 2015-10-24 NOTE — Patient Instructions (Addendum)
Continue current meds We will call with lab results Bone Density to be scheduled Referral to Dr. Luna Glasgow  Release of information Dr. Lorin Mercy- orthopedics SHingles vaccine sent to pharmacy Take the Azor once a day until you get your blood pressure medication F/U 6 months

## 2015-10-24 NOTE — Assessment & Plan Note (Signed)
For now will take Azor 5/20mg  once a day, we do not have any other BP meds to sample She will then resume her regular medications

## 2015-10-24 NOTE — Progress Notes (Signed)
Patient ID: Kathy Barr, female   DOB: [birth year], 59 y.o.   MRN: [id number] Subjective:   Patient presents for Medicare Annual/Subsequent preventive examination.    Due for fasting labs   Medications reviewed  Her only concern today is right shoulder pain. This is been present for greater than 6 months. She has known cervical disc disease and had nerve impingement she has had surgery on her neck. She states that orthopedics gave her an injection in the shoulder at the total was initially bursitis but she has not seen any improvement. She takes Naprosyn on and off. She was advised to follow-up with her local orthopedist but she needed a referral.  Note she was seen in emergency room for foot pain there is no particular injury no fracture, this is improved Review Past Medical/Family/Social:Per EMR    Risk Factors  Current exercise habits: none Dietary issues discussed: Yes  Cardiac risk factors: Obesity (BMI >= 30 kg/m2). HTN  Depression Screen  (Note: if answer to either of the following is "Yes", a more complete depression screening is indicated)  Over the past two weeks, have you felt down, depressed or hopeless? No Over the past two weeks, have you felt little interest or pleasure in doing things? No Have you lost interest or pleasure in daily life? No Do you often feel hopeless? No Do you cry easily over simple problems? No   Activities of Daily Living  In your present state of health, do you have any difficulty performing the following activities?:  Driving? No  Managing money? No  Feeding yourself? No  Getting from bed to chair? No  Climbing a flight of stairs? No  Preparing food and eating?: No  Bathing or showering? No  Getting dressed: No  Getting to the toilet? No  Using the toilet:No  Moving around from place to place: No  In the past year have you fallen or had a near fall?:No  Are you sexually active? No  Do you have more than one partner? No   Hearing  Difficulties: No  Do you often ask people to speak up or repeat themselves? No  Do you experience ringing or noises in your ears? No Do you have difficulty understanding soft or whispered voices? No  Do you feel that you have a problem with memory? No Do you often misplace items? No  Do you feel safe at home? Yes  Cognitive Testing  Alert? Yes Normal Appearance?Yes  Oriented to person? Yes Place? Yes  Time? Yes  Recall of three objects? Yes  Can perform simple calculations? Yes  Displays appropriate judgment?Yes  Can read the correct time from a watch face?Yes   List the Names of Other Physician/Practitioners you currently use:  Dr. Laural Golden, Orthopedics    Screening Tests / Date Colonoscopy  UTD                   Zostavax - Due Mammogram - UTD Influenza Vaccine - UTD  Tetanus/tdap- unable financial Pneumovax- UTD  ROS:  GEN- denies fatigue, fever, weight loss,weakness, recent illness HEENT- denies eye drainage, change in vision, nasal discharge, CVS- denies chest pain, palpitations RESP- denies SOB, cough, wheeze ABD- denies N/V, change in stools, abd pain GU- denies dysuria, hematuria, dribbling, incontinence MSK- + joint pain, muscle aches, injury Neuro- denies headache, dizziness, syncope, seizure activity  PHYSICAL: GEN- NAD, alert and oriented x3 HEENT- PERRL, EOMI, non injected sclera, pink conjunctiva, MMM, oropharynx clear Neck- Supple, no thryomegaly CVS- RRR,  no murmur RESP-CTAB ABD-NABS,soft,ND,NT MSK- Decreased ROM Right shoulder/UE, +empty can, biceps in tact, decreased ROM neck  EXT- No edema Pulses- Radial, DP- 2+   Assessment:    Annual wellness medicare exam   Plan:    During the course of the visit the patient was educated and counseled about appropriate screening and preventive services including:   Shingles vaccine. Prescription given to that she can get the vaccine at the pharmacy or Medicare part D.  Screen Neg  for depression.   Bone  Density to be done Diet review for nutrition referral? Yes ____ Not Indicated __x__  Patient Instructions (the written plan) was given to the patient.  Medicare Attestation  I have personally reviewed:  The patient's medical and social history  Their use of alcohol, tobacco or illicit drugs  Their current medications and supplements  The patient's functional ability including ADLs,fall risks, home safety risks, cognitive, and hearing and visual impairment  Diet and physical activities  Evidence for depression or mood disorders  The patient's weight, height, BMI, and visual acuity have been recorded in the chart. I have made referrals, counseling, and provided education to the patient based on review of the above and I have provided the patient with a written personalized care plan for preventive services.

## 2015-10-24 NOTE — Assessment & Plan Note (Signed)
Frozen shoulder with possible rotator injury, will send to orthopedics for further evaluation. She was seen by Dr. Lorin Mercy in the past but was told to follow-up with her local orthopedist. I have given her some hydrocodone short term, she has Naprosyn. She has significant decreased range of motion in the right shoulder

## 2015-10-24 NOTE — Addendum Note (Signed)
Addended by: Sheral Flow on: 10/24/2015 11:35 AM   Modules accepted: Orders

## 2015-10-24 NOTE — Assessment & Plan Note (Signed)
Down to 3 cig /day

## 2015-10-25 LAB — VITAMIN D 25 HYDROXY (VIT D DEFICIENCY, FRACTURES): VIT D 25 HYDROXY: 26 ng/mL — AB (ref 30–100)

## 2015-11-07 ENCOUNTER — Ambulatory Visit (HOSPITAL_COMMUNITY)
Admission: RE | Admit: 2015-11-07 | Discharge: 2015-11-07 | Disposition: A | Discharge status: Home / Self Care | Payer: Medicare Other | Source: Ambulatory Visit | Attending: Family Medicine | Admitting: Family Medicine

## 2015-11-07 DIAGNOSIS — M810 Age-related osteoporosis without current pathological fracture: Secondary | ICD-10-CM | POA: Diagnosis not present

## 2015-11-07 DIAGNOSIS — Z780 Asymptomatic menopausal state: Secondary | ICD-10-CM | POA: Insufficient documentation

## 2015-11-07 DIAGNOSIS — Z13820 Encounter for screening for osteoporosis: Secondary | ICD-10-CM | POA: Diagnosis not present

## 2015-11-07 DIAGNOSIS — M8580 Other specified disorders of bone density and structure, unspecified site: Secondary | ICD-10-CM | POA: Diagnosis present

## 2015-11-07 DIAGNOSIS — M159 Polyosteoarthritis, unspecified: Secondary | ICD-10-CM

## 2015-11-08 ENCOUNTER — Encounter: Payer: Self-pay | Admitting: *Deleted

## 2015-11-09 DIAGNOSIS — M7551 Bursitis of right shoulder: Secondary | ICD-10-CM | POA: Diagnosis not present

## 2015-12-22 ENCOUNTER — Other Ambulatory Visit: Payer: Self-pay | Admitting: Family Medicine

## 2015-12-22 DIAGNOSIS — Z1231 Encounter for screening mammogram for malignant neoplasm of breast: Secondary | ICD-10-CM

## 2016-01-15 ENCOUNTER — Ambulatory Visit (HOSPITAL_COMMUNITY)
Admission: RE | Admit: 2016-01-15 | Discharge: 2016-01-15 | Disposition: A | Discharge status: Home / Self Care | Payer: Medicare Other | Source: Ambulatory Visit | Attending: Family Medicine | Admitting: Family Medicine

## 2016-01-15 DIAGNOSIS — Z1231 Encounter for screening mammogram for malignant neoplasm of breast: Secondary | ICD-10-CM | POA: Diagnosis present

## 2016-01-17 ENCOUNTER — Encounter: Payer: Self-pay | Admitting: Family Medicine

## 2016-01-17 ENCOUNTER — Ambulatory Visit (HOSPITAL_COMMUNITY)
Admission: RE | Admit: 2016-01-17 | Discharge: 2016-01-17 | Disposition: A | Discharge status: Home / Self Care | Payer: Medicare Other | Source: Ambulatory Visit | Attending: Family Medicine | Admitting: Family Medicine

## 2016-01-17 ENCOUNTER — Ambulatory Visit (INDEPENDENT_AMBULATORY_CARE_PROVIDER_SITE_OTHER): Payer: Medicaid Other | Admitting: Family Medicine

## 2016-01-17 VITALS — BP 118/72 | HR 82 | Temp 98.0°F | Resp 18 | Ht 64.0 in | Wt 198.0 lb

## 2016-01-17 DIAGNOSIS — M79672 Pain in left foot: Secondary | ICD-10-CM | POA: Diagnosis not present

## 2016-01-17 DIAGNOSIS — M21612 Bunion of left foot: Secondary | ICD-10-CM | POA: Diagnosis not present

## 2016-01-17 MED ORDER — HYDROCODONE-ACETAMINOPHEN 5-325 MG PO TABS: 1.0000 | ORAL_TABLET | Freq: Four times a day (QID) | ORAL | Status: DC | PRN

## 2016-01-17 NOTE — Patient Instructions (Signed)
Get the xray from [facility] Blood pressure looks good Take aspirin 81mg  (baby) F/U as previous

## 2016-01-17 NOTE — Progress Notes (Signed)
Patient ID: Kathy Barr, female   DOB: [birth year], 60 y.o.   MRN: [id number]   Subjective:    Patient ID: Kathy Barr, female    DOB: [birth year], 60 y.o.   MRN: [id number]  Patient presents for Left foot pain  history here with left foot pain for the past few months. She was seen in the emergency room back in October x-ray was not done at that time she did not have any particular injury. She was given anti-inflammatories and a walking B which she wore for a few weeks. This helped some. She is no longer taking any kind of medication. She states that she has pain every day when she walks at the back of her heel. Occasionally she gets pain in her art. She does have an appointment with the foot doctor this month but they cannot see her urgently. She denies any swelling no redness or bruising. Of note she is also being followed by orthopedics for her frozen shoulder they did another steroid injection which helped for a few weeks the next step will be surgical intervention. She is not on any chronic pain medication.  She has had some nasal stuffiness and congestion but no fever no significant cough using nasal saline rinse    Review Of Systems:  GEN- denies fatigue, fever, weight loss,weakness, recent illness HEENT- denies eye drainage, change in vision, +nasal discharge, CVS- denies chest pain, palpitations RESP- denies SOB, cough, wheeze ABD- denies N/V, change in stools, abd pain GU- denies dysuria, hematuria, dribbling, incontinence MSK- + joint pain, muscle aches, injury Neuro- denies headache, dizziness, syncope, seizure activity       Objective:    BP 118/72 mmHg  Pulse 82  Temp(Src) 98 F (36.7 C) (Oral)  Resp 18  Ht 5\' 4"  (1.626 m)  Wt 198 lb (89.812 kg)  BMI 33.97 kg/m2 GEN- NAD, alert and oriented x3 GEN- NAD, alert and oriented x3 HEENT- PERRL, EOMI, non injected sclera, pink conjunctiva, MMM, oropharynx clear, TM clear bilat no effusion, no  maxillary sinus tenderness,  nares clear  Neck- Supple, no LAD CVS- RRR, no murmur RESP-CTAB MSK- Left foot- normal inspection, TTP over heel, no swelling, no bruising, FROM ankle, Achilles in tact  Pulses- DP- 2+        Assessment & Plan:      Problem List Items Addressed This Visit    None    Visit Diagnoses    Heel pain, left    -  Primary    concern for calcneal spur, has appt with podiatry already scheduled for 17th of this month, Ice heel  Give Norco 20 tabs,  Will also help with shoulder until f/u with ortho Xray of foot today OTC nasal saline for stuffiness    Relevant Orders    DG Foot Complete Left       Note: This dictation was prepared with Dragon dictation along with smaller phrase technology. Any transcriptional errors that result from this process are unintentional.

## 2016-02-07 ENCOUNTER — Telehealth: Payer: Self-pay | Admitting: Family Medicine

## 2016-02-07 NOTE — Telephone Encounter (Signed)
Called pt back and got more information as to what was needed in reference to referral and she stated that her ortho doctor office informed her that she needs to get a referral placed thru her insurance, pt has [facility] which does not require referral.

## 2016-02-07 NOTE — Telephone Encounter (Signed)
Patient says she needs new referral to dr Rip [facility]bour ortho in [place] for her appointment on 02/23/2016

## 2016-02-13 ENCOUNTER — Other Ambulatory Visit: Payer: Self-pay | Admitting: Family Medicine

## 2016-02-13 NOTE — Telephone Encounter (Signed)
Patient needs rx for her hydrocone  [phone]

## 2016-02-13 NOTE — Telephone Encounter (Signed)
Ok to refill??  Last office visit/ refill 01/17/2016.

## 2016-02-13 NOTE — Telephone Encounter (Signed)
Okay to give 1 more refill, she was suppose to see specialist for her pain I cant keep her on chronic pain meds for this

## 2016-02-14 ENCOUNTER — Telehealth: Payer: Self-pay | Admitting: Family Medicine

## 2016-02-14 MED ORDER — HYDROCODONE-ACETAMINOPHEN 5-325 MG PO TABS: 1.0000 | ORAL_TABLET | Freq: Four times a day (QID) | ORAL | Status: DC | PRN

## 2016-02-14 NOTE — Telephone Encounter (Signed)
Pt aware can pick up RX

## 2016-02-14 NOTE — Telephone Encounter (Signed)
Patient requesting rx for her hydrocodone  [phone]

## 2016-02-14 NOTE — Telephone Encounter (Signed)
Printed script ready for provider signature

## 2016-02-19 ENCOUNTER — Ambulatory Visit: Payer: Medicaid Other | Admitting: Women's Health

## 2016-02-20 ENCOUNTER — Ambulatory Visit (INDEPENDENT_AMBULATORY_CARE_PROVIDER_SITE_OTHER): Payer: Medicare Other | Admitting: Women's Health

## 2016-02-20 ENCOUNTER — Encounter: Payer: Self-pay | Admitting: Women's Health

## 2016-02-20 VITALS — BP 116/70 | HR 72 | Wt 200.0 lb

## 2016-02-20 DIAGNOSIS — B373 Candidiasis of vulva and vagina: Secondary | ICD-10-CM | POA: Diagnosis not present

## 2016-02-20 DIAGNOSIS — L298 Other pruritus: Secondary | ICD-10-CM | POA: Diagnosis not present

## 2016-02-20 DIAGNOSIS — B3731 Acute candidiasis of vulva and vagina: Secondary | ICD-10-CM

## 2016-02-20 DIAGNOSIS — N898 Other specified noninflammatory disorders of vagina: Secondary | ICD-10-CM

## 2016-02-20 LAB — POCT WET PREP (WET MOUNT): Clue Cells Wet Prep Whiff POC: NEGATIVE

## 2016-02-20 MED ORDER — FLUCONAZOLE 150 MG PO TABS: 150.0000 mg | ORAL_TABLET | Freq: Once | ORAL | Status: DC

## 2016-02-20 NOTE — Progress Notes (Signed)
Patient ID: Kathy Barr, female   DOB: 07/14/1956, 60 y.o.   MRN: [id number]   [facility] Visit  Patient name: Kathy Barr MRN [id number]  Date of birth: [birth year]  CC & HPI:  Kathy Barr is a 61 y.o. Caucasian female presenting today for vulvovaginal itching/burning and pain w/ sex x 2-3wks. Denies abnormal d/c or odor. Has been using Luvena and lubricant occasionally. Partner wants sex 'all the time!' Feels like something is scratching on inside during sex, like a hair or something. Denies any changes in partners. S/P hysterectomy and unilateral SO.   Gets physicals w/ her PCP.  No LMP recorded. Patient has had a hysterectomy.  Pertinent History Reviewed:  Medical & Surgical Hx:   Past medical, surgical, family, and social history reviewed in electronic medical record Medications: Reviewed & Updated - see associated section Allergies: Reviewed in electronic medical record  Objective Findings:  Vitals: BP 116/70 mmHg  Pulse 72  Wt 200 lb (90.719 kg) Body mass index is 34.31 kg/(m^2).  Physical Examination: General appearance - alert, well appearing, and in no distress Pelvic - external genitalia normal, atrophic c/w age, no evidence of lichen sclerosus. Vaginal cuff appears normal, small amt thick white clumpy nonodorous d/c c/w yeast. No abrasions/abnormalities noted w/in vaginal cavity to explain scratching sensation during sex  Results for orders placed or performed in visit on 02/20/16 (from the past 24 hour(s))  POCT Wet Prep [facility] [place])   Collection Time: 02/20/16 10:19 AM  Result Value Ref Range   Source Wet Prep POC vaginal    WBC, Wet Prep HPF POC none    Bacteria Wet Prep HPF POC None None, Few, Too numerous to count   BACTERIA WET PREP MORPHOLOGY POC     Clue Cells Wet Prep HPF POC None None, Too numerous to count   Clue Cells Wet Prep Whiff POC Negative Whiff    Yeast Wet Prep HPF POC Few    KOH Wet Prep POC     Trichomonas Wet Prep HPF POC none       Assessment & Plan:  A:   Vulvovaginal yeast  P:  Rx diflucan 150mg  po x 1, may repeat in 3d if needed  GC/CT from urine  Continue Luvena- increase if needed. Lubricant w/ sex  Return for prn.  Tawnya Crook CNM, [facility] ([facility]) 02/20/2016 10:20 AM

## 2016-02-22 LAB — GC/CHLAMYDIA PROBE AMP
Chlamydia trachomatis, NAA: NEGATIVE
Neisseria gonorrhoeae by PCR: NEGATIVE

## 2016-04-19 LAB — BASIC METABOLIC PANEL: Glucose: 107 mg/dL

## 2016-05-08 ENCOUNTER — Telehealth: Payer: Self-pay | Admitting: Family Medicine

## 2016-05-08 MED ORDER — HYDROCODONE-ACETAMINOPHEN 5-325 MG PO TABS: 1.0000 | ORAL_TABLET | Freq: Four times a day (QID) | ORAL | Status: DC | PRN

## 2016-05-08 NOTE — Telephone Encounter (Signed)
Pt has scheduled an appt to see Dr. Buelah Manis on 6/6 but is requesting a refill of Hydrocodone 5-325 to last until she comes in. [facility]

## 2016-05-08 NOTE — Telephone Encounter (Signed)
Ok to refill??  Last office visit 02/20/2016.  Last refill 02/14/2016.

## 2016-05-08 NOTE — Telephone Encounter (Signed)
Okay to refill? 

## 2016-05-08 NOTE — Telephone Encounter (Signed)
Prescription printed and patient made aware to come to office to pick up after 2pm on 05/08/2016.

## 2016-05-14 ENCOUNTER — Encounter: Payer: Self-pay | Admitting: Family Medicine

## 2016-05-14 ENCOUNTER — Ambulatory Visit (INDEPENDENT_AMBULATORY_CARE_PROVIDER_SITE_OTHER): Payer: Medicare Other | Admitting: Family Medicine

## 2016-05-14 VITALS — BP 130/68 | HR 72 | Temp 97.8°F | Resp 16 | Ht 64.0 in | Wt 198.0 lb

## 2016-05-14 DIAGNOSIS — I10 Essential (primary) hypertension: Secondary | ICD-10-CM

## 2016-05-14 DIAGNOSIS — M5136 Other intervertebral disc degeneration, lumbar region: Secondary | ICD-10-CM

## 2016-05-14 DIAGNOSIS — Z720 Tobacco use: Secondary | ICD-10-CM | POA: Diagnosis not present

## 2016-05-14 DIAGNOSIS — M159 Polyosteoarthritis, unspecified: Secondary | ICD-10-CM | POA: Diagnosis not present

## 2016-05-14 DIAGNOSIS — Z91048 Other nonmedicinal substance allergy status: Secondary | ICD-10-CM | POA: Diagnosis not present

## 2016-05-14 DIAGNOSIS — Z9109 Other allergy status, other than to drugs and biological substances: Secondary | ICD-10-CM

## 2016-05-14 MED ORDER — VALSARTAN-HYDROCHLOROTHIAZIDE 160-25 MG PO TABS: ORAL_TABLET | ORAL | Status: DC

## 2016-05-14 MED ORDER — ZOSTER VACCINE LIVE 19400 UNT/0.65ML ~~LOC~~ SUSR: 0.6500 mL | Freq: Once | SUBCUTANEOUS | Status: DC

## 2016-05-14 MED ORDER — HYDROCODONE-ACETAMINOPHEN 5-325 MG PO TABS: 1.0000 | ORAL_TABLET | Freq: Four times a day (QID) | ORAL | Status: DC | PRN

## 2016-05-14 NOTE — Patient Instructions (Addendum)
F/U Nov for Physical  Okay to use claritin

## 2016-05-14 NOTE — Assessment & Plan Note (Signed)
Blood pressure well controlled no change to medication

## 2016-05-14 NOTE — Progress Notes (Signed)
Patient ID: Kathy Barr, female   DOB: Jul 22, 1956, 60 y.o.   MRN: [id number]    Subjective:    Patient ID: Kathy Barr, female    DOB: 1956-07-20, 60 y.o.   MRN: [id number]  Patient presents for Medication Review/ Refill Patient to follow-up chronic medical problems. She has had some stuffy head sneezing and itchy ears the past couple days. She is not taking any antihistamines.   Hypertension she is taking her medication as prescribed. Next  Generalized osteoarthritis chronic joint pain shoulder pain she is followed by orthopedics. They're not treated with any medications. I haven't given her hydrocodone 20-30 tablets a month as needed.  She is working on cutting back on tobacco she is now down to 8 cigarettes per day  Note she is no longer able to follow with her current podiatrist because they did not accept her insurance. She did get injections in her heels because of her spurs  Review Of Systems:  GEN- denies fatigue, fever, weight loss,weakness, recent illness HEENT- denies eye drainage, change in vision, nasal discharge, CVS- denies chest pain, palpitations RESP- denies SOB, cough, wheeze ABD- denies N/V, change in stools, abd pain GU- denies dysuria, hematuria, dribbling, incontinence MSK-+ joint pain, muscle aches, injury Neuro- denies headache, dizziness, syncope, seizure activity       Objective:    BP 130/68 mmHg  Pulse 72  Temp(Src) 97.8 F (36.6 C) (Oral)  Resp 16  Ht 5\' 4"  (1.626 m)  Wt 198 lb (89.812 kg)  BMI 33.97 kg/m2 GEN- NAD, alert and oriented x3 HEENT- PERRL, EOMI, non injected sclera, pink conjunctiva, MMM, oropharynx clear, nares clear, TM clear no effusion, canal clear , no sinus tenderness  Neck- Supple, no LAD  CVS- RRR, no murmur RESP-CTAB EXT- No edema Pulses- Radial  2+        Assessment & Plan:      Problem List Items Addressed This Visit    Tobacco user    Continue to work on tobaccco cessation      Generalized OA   Relevant Medications   HYDROcodone-acetaminophen (NORCO) 5-325 MG tablet   Essential hypertension, benign - Primary    Blood pressure well controlled no change to medication      Relevant Medications   valsartan-hydrochlorothiazide (DIOVAN-HCT) 160-25 MG tablet   DDD (degenerative disc disease), lumbar    norco refilled      Relevant Medications   HYDROcodone-acetaminophen (NORCO) 5-325 MG tablet    Other Visit Diagnoses    Environmental allergies        OTC anti-histamine       Note: This dictation was prepared with Dragon dictation along with smaller phrase technology. Any transcriptional errors that result from this process are unintentional.

## 2016-05-15 ENCOUNTER — Encounter: Payer: Self-pay | Admitting: Family Medicine

## 2016-05-15 NOTE — Assessment & Plan Note (Signed)
Continue to work on [employer] cessation

## 2016-05-15 NOTE — Assessment & Plan Note (Signed)
norco refilled  

## 2016-05-30 DIAGNOSIS — M7551 Bursitis of right shoulder: Secondary | ICD-10-CM | POA: Diagnosis not present

## 2016-07-16 ENCOUNTER — Telehealth: Payer: Self-pay | Admitting: Family Medicine

## 2016-07-16 NOTE — Telephone Encounter (Signed)
Calling and says has been sick for 1-2 weeks with cough and congestion.  Told pt if has been sick that long really should be seen.  Gave appt for tomorrow

## 2016-07-17 ENCOUNTER — Encounter: Payer: Self-pay | Admitting: Family Medicine

## 2016-07-17 ENCOUNTER — Ambulatory Visit (INDEPENDENT_AMBULATORY_CARE_PROVIDER_SITE_OTHER): Payer: Medicare Other | Admitting: Family Medicine

## 2016-07-17 VITALS — BP 140/88 | HR 100 | Temp 97.9°F | Resp 18 | Ht 64.0 in | Wt 197.0 lb

## 2016-07-17 DIAGNOSIS — J209 Acute bronchitis, unspecified: Secondary | ICD-10-CM

## 2016-07-17 DIAGNOSIS — M5136 Other intervertebral disc degeneration, lumbar region: Secondary | ICD-10-CM | POA: Diagnosis not present

## 2016-07-17 MED ORDER — METHYLPREDNISOLONE ACETATE 40 MG/ML IJ SUSP
40.0000 mg | Freq: Once | INTRAMUSCULAR | Status: AC
  Administered 2016-07-17: 40 mg via INTRAMUSCULAR

## 2016-07-17 MED ORDER — GUAIFENESIN-CODEINE 100-10 MG/5ML PO SOLN: 5.0000 mL | Freq: Four times a day (QID) | ORAL | 0 refills | Status: DC | PRN

## 2016-07-17 MED ORDER — ALBUTEROL SULFATE HFA 108 (90 BASE) MCG/ACT IN AERS: 2.0000 | INHALATION_SPRAY | Freq: Four times a day (QID) | RESPIRATORY_TRACT | 0 refills | Status: DC | PRN

## 2016-07-17 MED ORDER — HYDROCODONE-ACETAMINOPHEN 5-325 MG PO TABS: 1.0000 | ORAL_TABLET | Freq: Four times a day (QID) | ORAL | 0 refills | Status: DC | PRN

## 2016-07-17 MED ORDER — PREDNISONE 20 MG PO TABS: 40.0000 mg | ORAL_TABLET | Freq: Every day | ORAL | 0 refills | Status: DC

## 2016-07-17 MED ORDER — AZITHROMYCIN 250 MG PO TABS: ORAL_TABLET | ORAL | 0 refills | Status: DC

## 2016-07-17 NOTE — Progress Notes (Signed)
   Subjective:    Patient ID: Kathy Barr, female    DOB: 15-Apr-1956, 60 y.o.   MRN: [id number]  Patient presents for Illness (x2 weeks- prodictive cough with white mucus)  Patient with progressive symptoms of the past 2 weeks of cough with production wheezing just gets congestion. She's had some posttussive emesis. She feels sore all over including into her back from coughing. She's been taking over-the-counter cough syrups with no improvement she has not had any fever. She states she quit smoking a little over a month ago. No known sick contacts   Review Of Systems:  GEN- denies fatigue, fever, weight loss,weakness, recent illness HEENT- denies eye drainage, change in vision, nasal discharge, CVS- denies chest pain, palpitations RESP- denies SOB, +cough, +wheeze ABD- denies N/V, change in stools, abd pain GU- denies dysuria, hematuria, dribbling, incontinence MSK- denies joint pain, muscle aches, injury Neuro- denies headache, dizziness, syncope, seizure activity       Objective:    BP 140/88 (BP Location: Right Arm, Patient Position: Sitting, Cuff Size: Large)   Pulse 100   Temp 97.9 F (36.6 C) (Oral)   Resp 18   Ht 5\' 4"  (1.626 m)   Wt 197 lb (89.4 kg)   SpO2 98% Comment: RA  BMI 33.81 kg/m  GEN- NAD, alert and oriented x3 HEENT- PERRL, EOMI, non injected sclera, pink conjunctiva, MMM, oropharynx clear Neck- Supple, no LAD  CVS- RRR, no murmur RESP-bilat wheeze, congestion bilat, increased WOB with cough, no retractions  Chest wall- TTP lower chest wall, EXT- No edema Pulses- Radial 2+        Assessment & Plan:      Problem List Items Addressed This Visit    DDD (degenerative disc disease), lumbar    Pain medication refilled, not to fill until off cough medicine      Relevant Medications   predniSONE (DELTASONE) 20 MG tablet   HYDROcodone-acetaminophen (NORCO) 5-325 MG tablet   methylPREDNISolone acetate (DEPO-MEDROL) injection 40 mg (Completed)     Other Visit Diagnoses    Acute bronchitis, unspecified organism    -  Primary   zpak, albuterol depo medrol in office, prednisone burst, robitussin codiene,advised to stay off tobacco    Relevant Medications   methylPREDNISolone acetate (DEPO-MEDROL) injection 40 mg (Completed)      Note: This dictation was prepared with Dragon dictation along with smaller phrase technology. Any transcriptional errors that result from this process are unintentional.

## 2016-07-17 NOTE — Patient Instructions (Signed)
Use cough medicine  Steroids  Albuterol inhaler  You can get pain medicines filled in 1 week  F/U as previous

## 2016-07-17 NOTE — Assessment & Plan Note (Signed)
Pain medication refilled, not to fill until off cough medicine

## 2016-07-26 ENCOUNTER — Telehealth: Payer: Self-pay | Admitting: Family Medicine

## 2016-07-26 ENCOUNTER — Ambulatory Visit (HOSPITAL_COMMUNITY)
Admission: RE | Admit: 2016-07-26 | Discharge: 2016-07-26 | Disposition: A | Discharge status: Home / Self Care | Payer: Medicare Other | Source: Ambulatory Visit | Attending: Family Medicine | Admitting: Family Medicine

## 2016-07-26 DIAGNOSIS — R05 Cough: Secondary | ICD-10-CM | POA: Diagnosis not present

## 2016-07-26 DIAGNOSIS — J209 Acute bronchitis, unspecified: Secondary | ICD-10-CM

## 2016-07-26 DIAGNOSIS — R079 Chest pain, unspecified: Secondary | ICD-10-CM | POA: Diagnosis not present

## 2016-07-26 NOTE — Telephone Encounter (Signed)
Is not feeling any better from visit earlier in month.  Cough only slightly better.  But with severe rib and back pain.  Had some old cyclobenzaprine at home but that is not helping.  Having trouble just moving around.  Please advise?

## 2016-07-26 NOTE — Telephone Encounter (Signed)
Pt called and told to go for stat CXR.

## 2016-07-26 NOTE — Telephone Encounter (Signed)
Please have pt go get CXR- Dx acute bronchitis Also is she still using robitussin with codiene

## 2016-10-29 ENCOUNTER — Telehealth: Payer: Self-pay | Admitting: Family Medicine

## 2016-10-29 ENCOUNTER — Encounter: Payer: Self-pay | Admitting: Family Medicine

## 2016-10-29 MED ORDER — HYDROCODONE-ACETAMINOPHEN 5-325 MG PO TABS: 1.0000 | ORAL_TABLET | Freq: Four times a day (QID) | ORAL | 0 refills | Status: DC | PRN

## 2016-10-29 NOTE — Telephone Encounter (Signed)
Ok to refill??  Last office visit/ refill 07/17/2016.

## 2016-10-29 NOTE — Telephone Encounter (Signed)
Prescription printed and patient made aware to come to office to pick up on 10/30/2016 per VM.

## 2016-10-29 NOTE — Telephone Encounter (Signed)
Patient called requesting a refill on her hydrocodone   CB# [phone]

## 2016-10-29 NOTE — Telephone Encounter (Signed)
okay

## 2016-12-17 ENCOUNTER — Encounter: Payer: Self-pay | Admitting: Family Medicine

## 2016-12-23 ENCOUNTER — Other Ambulatory Visit: Payer: Self-pay | Admitting: Family Medicine

## 2016-12-23 DIAGNOSIS — Z1231 Encounter for screening mammogram for malignant neoplasm of breast: Secondary | ICD-10-CM

## 2017-01-20 ENCOUNTER — Ambulatory Visit (HOSPITAL_COMMUNITY)
Admission: RE | Admit: 2017-01-20 | Discharge: 2017-01-20 | Disposition: A | Discharge status: Home / Self Care | Payer: Medicare Other | Source: Ambulatory Visit | Attending: Family Medicine | Admitting: Family Medicine

## 2017-01-20 DIAGNOSIS — Z1231 Encounter for screening mammogram for malignant neoplasm of breast: Secondary | ICD-10-CM | POA: Diagnosis not present

## 2017-02-04 ENCOUNTER — Ambulatory Visit (INDEPENDENT_AMBULATORY_CARE_PROVIDER_SITE_OTHER): Payer: Medicare Other | Admitting: Family Medicine

## 2017-02-04 ENCOUNTER — Encounter: Payer: Self-pay | Admitting: Family Medicine

## 2017-02-04 VITALS — BP 138/76 | HR 82 | Temp 98.1°F | Resp 16 | Ht 64.0 in | Wt 196.0 lb

## 2017-02-04 DIAGNOSIS — I10 Essential (primary) hypertension: Secondary | ICD-10-CM | POA: Diagnosis not present

## 2017-02-04 DIAGNOSIS — E6609 Other obesity due to excess calories: Secondary | ICD-10-CM

## 2017-02-04 DIAGNOSIS — M159 Polyosteoarthritis, unspecified: Secondary | ICD-10-CM

## 2017-02-04 DIAGNOSIS — M5136 Other intervertebral disc degeneration, lumbar region: Secondary | ICD-10-CM | POA: Diagnosis not present

## 2017-02-04 DIAGNOSIS — Z720 Tobacco use: Secondary | ICD-10-CM | POA: Diagnosis not present

## 2017-02-04 DIAGNOSIS — Z0000 Encounter for general adult medical examination without abnormal findings: Secondary | ICD-10-CM

## 2017-02-04 DIAGNOSIS — Z1159 Encounter for screening for other viral diseases: Secondary | ICD-10-CM

## 2017-02-04 DIAGNOSIS — Z6833 Body mass index (BMI) 33.0-33.9, adult: Secondary | ICD-10-CM

## 2017-02-04 DIAGNOSIS — R42 Dizziness and giddiness: Secondary | ICD-10-CM

## 2017-02-04 LAB — COMPREHENSIVE METABOLIC PANEL
ALBUMIN: 4 g/dL (ref 3.6–5.1)
ALK PHOS: 75 U/L (ref 33–130)
ALT: 12 U/L (ref 6–29)
AST: 18 U/L (ref 10–35)
BUN: 7 mg/dL (ref 7–25)
CALCIUM: 9.3 mg/dL (ref 8.6–10.4)
CO2: 29 mmol/L (ref 20–31)
Chloride: 100 mmol/L (ref 98–110)
Creat: 0.73 mg/dL (ref 0.50–0.99)
GLUCOSE: 92 mg/dL (ref 70–99)
POTASSIUM: 4.2 mmol/L (ref 3.5–5.3)
Sodium: 138 mmol/L (ref 135–146)
Total Bilirubin: 1 mg/dL (ref 0.2–1.2)
Total Protein: 7.3 g/dL (ref 6.1–8.1)

## 2017-02-04 LAB — CBC WITH DIFFERENTIAL/PLATELET
Basophils Absolute: 0 cells/uL (ref 0–200)
Basophils Relative: 0 %
EOS ABS: 231 {cells}/uL (ref 15–500)
Eosinophils Relative: 3 %
HEMATOCRIT: 42.2 % (ref 35.0–45.0)
HEMOGLOBIN: 14.2 g/dL (ref 12.0–15.0)
LYMPHS PCT: 22 %
Lymphs Abs: 1694 cells/uL (ref 850–3900)
MCH: 28.9 pg (ref 27.0–33.0)
MCHC: 33.6 g/dL (ref 32.0–36.0)
MCV: 85.9 fL (ref 80.0–100.0)
MONO ABS: 693 {cells}/uL (ref 200–950)
MPV: 9.1 fL (ref 7.5–12.5)
Monocytes Relative: 9 %
Neutro Abs: 5082 cells/uL (ref 1500–7800)
Neutrophils Relative %: 66 %
Platelets: 230 10*3/uL (ref 140–400)
RBC: 4.91 MIL/uL (ref 3.80–5.10)
RDW: 13.8 % (ref 11.0–15.0)
WBC: 7.7 10*3/uL (ref 3.8–10.8)

## 2017-02-04 LAB — LIPID PANEL
CHOL/HDL RATIO: 3.8 ratio (ref <5.0)
Cholesterol: 183 mg/dL (ref <200)
HDL: 48 mg/dL — AB (ref >50)
LDL Cholesterol: 86 mg/dL (ref <100)
Triglycerides: 244 mg/dL — ABNORMAL HIGH (ref <150)
VLDL: 49 mg/dL — ABNORMAL HIGH (ref <30)

## 2017-02-04 MED ORDER — MECLIZINE HCL 12.5 MG PO TABS: 12.5000 mg | ORAL_TABLET | Freq: Three times a day (TID) | ORAL | 1 refills | Status: DC | PRN

## 2017-02-04 MED ORDER — HYDROCODONE-ACETAMINOPHEN 5-325 MG PO TABS: 1.0000 | ORAL_TABLET | Freq: Four times a day (QID) | ORAL | 0 refills | Status: DC | PRN

## 2017-02-04 NOTE — Patient Instructions (Addendum)
F/U 6 months  Referral to Vestibular Physical therapy  Use meclizine as needed  Schedule eye exam

## 2017-02-04 NOTE — Progress Notes (Signed)
Subjective:   Patient presents for Medicare Annual/Subsequent preventive examination.  Medications reviewed Due for fasting labs Hypertension- taking BP meds as prescribed, no side effects, no low blood pressure readings Discussed Hep C screening  Chronic Pain syndrome- for neck, shoulder, foot - request refill on pain meds, no longer seeing Dr. Lorin Mercy, does not want any ore "shots"  Getting episodes where feels "drunk" or dizzy symptoms, if she moves quick  and so to pick up something. This has been going on for over a year but she's had a few more episodes and typical. She has not used any meclizine or other dizzy medication. No falls   Review Past Medical/Family/Social: Per EMR    Risk Factors  Current exercise habits: walks some  Dietary issues discussed: Yes  Cardiac risk factors: Obesity (BMI >= 30 kg/m2).  HTN   Depression Screen  PHQ 9- SCORE 4  (Note: if answer to either of the following is "Yes", a more complete depression screening is indicated)  Over the past two weeks, have you felt down, depressed or hopeless? No Over the past two weeks, have you felt little interest or pleasure in doing things? yes Have you lost interest or pleasure in daily life? No Do you often feel hopeless? No Do you cry easily over simple problems? No   Activities of Daily Living  In your present state of health, do you have any difficulty performing the following activities?:  Driving? No  Managing money? No  Feeding yourself? No  Getting from bed to chair? No  Climbing a flight of stairs? No  Preparing food and eating?: No  Bathing or showering? No  Getting dressed: No  Getting to the toilet? No  Using the toilet:No  Moving around from place to place: No  In the past year have you fallen or had a near fall?:No  Are you sexually active? No  Do you have more than one partner? No   Hearing Difficulties: yes Do you often ask people to speak up or repeat themselves? No  Do you experience  ringing or noises in your ears? yes Do you have difficulty understanding soft or whispered voices? No  Do you feel that you have a problem with memory? No Do you often misplace items? YES  Do you feel safe at home? Yes  Cognitive Testing  Alert? Yes Normal Appearance?Yes  Oriented to person? Yes Place? Yes  Time? Yes  Recall of three objects? Yes  Can perform simple calculations? Yes  Displays appropriate judgment?Yes  Can read the correct time from a watch face?Yes   List the Names of Other Physician/Practitioners you currently use:    Previous - Dr. Lorin Mercy    Screening Tests / Date Colonoscopy  UTD                    Zostavax Due - unable to afford  Mammogram UTD  Influenza Vaccine  UTD  Tetanus/tdapUTD Pneumonia- UTD  S/p hysterectomy  Bone Density 2016- normal   ROS: GEN- denies fatigue, fever, weight loss,weakness, recent illness HEENT- denies eye drainage, change in vision, nasal discharge, CVS- denies chest pain, palpitations RESP- denies SOB, cough, wheeze ABD- denies N/V, change in stools, abd pain GU- denies dysuria, hematuria, dribbling, incontinence MSK- + joint pain, muscle aches, injury Neuro- denies headache, dizziness, syncope, seizure activity  Physical:  GEN- NAD, alert and oriented x3 HEENT- PERRL, EOMI, non injected sclera, pink conjunctiva, MMM, oropharynx clear Neck- Supple, no thryomegaly, no bruit  CVS- RRR, no murmur RESP-CTAB Neuro- CNII-XII in tact no deficits, after lying back for exam had dizzy sensation when stat up EXT- No edema Pulses- Radial, DP- 2+    Assessment:    Annual wellness medicare exam   Plan:    During the course of the visit the patient was educated and counseled about appropriate screening and preventive services including:      Shingles vaccine. Due but pt unable to afford the copay Screen + for depression. PHQ-4 score MOSTLy daily fatigue HTN- blood pressure controlled no changes, fasting labs today   Vertigo- given meclizine, she wants to try vestibular rehab as well Pain medication refilled for her OA/DDD lumbar spine   Encouraged healthy eating  Tobacco cessation    Diet review for nutrition referral? Yes ____ Not Indicated __x__  Patient Instructions (the written plan) was given to the patient.  Medicare Attestation  I have personally reviewed:  The patient's medical and social history  Their use of alcohol, tobacco or illicit drugs  Their current medications and supplements  The patient's functional ability including ADLs,fall risks, home safety risks, cognitive, and hearing and visual impairment  Diet and physical activities  Evidence for depression or mood disorders  The patient's weight, height, BMI, and visual acuity have been recorded in the chart. I have made referrals, counseling, and provided education to the patient based on review of the above and I have provided the patient with a written personalized care plan for preventive services.

## 2017-02-05 LAB — HEPATITIS C ANTIBODY: HCV Ab: NEGATIVE

## 2017-02-14 DIAGNOSIS — H25813 Combined forms of age-related cataract, bilateral: Secondary | ICD-10-CM | POA: Diagnosis not present

## 2017-02-14 DIAGNOSIS — I10 Essential (primary) hypertension: Secondary | ICD-10-CM | POA: Diagnosis not present

## 2017-02-14 DIAGNOSIS — H35033 Hypertensive retinopathy, bilateral: Secondary | ICD-10-CM | POA: Diagnosis not present

## 2017-02-19 ENCOUNTER — Emergency Department (HOSPITAL_COMMUNITY)
Admission: EM | Admit: 2017-02-19 | Discharge: 2017-02-19 | Disposition: A | Discharge status: Home / Self Care | Payer: Medicare Other | Attending: Emergency Medicine | Admitting: Emergency Medicine

## 2017-02-19 ENCOUNTER — Emergency Department (HOSPITAL_COMMUNITY): Payer: Medicare Other

## 2017-02-19 ENCOUNTER — Encounter (HOSPITAL_COMMUNITY): Payer: Self-pay | Admitting: Emergency Medicine

## 2017-02-19 DIAGNOSIS — Y999 Unspecified external cause status: Secondary | ICD-10-CM | POA: Diagnosis not present

## 2017-02-19 DIAGNOSIS — I10 Essential (primary) hypertension: Secondary | ICD-10-CM | POA: Diagnosis not present

## 2017-02-19 DIAGNOSIS — F17210 Nicotine dependence, cigarettes, uncomplicated: Secondary | ICD-10-CM | POA: Diagnosis not present

## 2017-02-19 DIAGNOSIS — Y9389 Activity, other specified: Secondary | ICD-10-CM | POA: Insufficient documentation

## 2017-02-19 DIAGNOSIS — M25561 Pain in right knee: Secondary | ICD-10-CM | POA: Diagnosis not present

## 2017-02-19 DIAGNOSIS — M542 Cervicalgia: Secondary | ICD-10-CM | POA: Diagnosis not present

## 2017-02-19 DIAGNOSIS — S161XXA Strain of muscle, fascia and tendon at neck level, initial encounter: Secondary | ICD-10-CM | POA: Insufficient documentation

## 2017-02-19 DIAGNOSIS — Z79899 Other long term (current) drug therapy: Secondary | ICD-10-CM | POA: Diagnosis not present

## 2017-02-19 DIAGNOSIS — S8001XA Contusion of right knee, initial encounter: Secondary | ICD-10-CM | POA: Diagnosis not present

## 2017-02-19 DIAGNOSIS — Y92410 Unspecified street and highway as the place of occurrence of the external cause: Secondary | ICD-10-CM | POA: Diagnosis not present

## 2017-02-19 DIAGNOSIS — S199XXA Unspecified injury of neck, initial encounter: Secondary | ICD-10-CM | POA: Diagnosis present

## 2017-02-19 DIAGNOSIS — Z7982 Long term (current) use of aspirin: Secondary | ICD-10-CM | POA: Diagnosis not present

## 2017-02-19 MED ORDER — IBUPROFEN 800 MG PO TABS
800.0000 mg | ORAL_TABLET | Freq: Once | ORAL | Status: AC
  Administered 2017-02-19: 800 mg via ORAL
  Filled 2017-02-19: qty 1

## 2017-02-19 MED ORDER — CYCLOBENZAPRINE HCL 10 MG PO TABS: 10.0000 mg | ORAL_TABLET | Freq: Three times a day (TID) | ORAL | 0 refills | Status: DC

## 2017-02-19 MED ORDER — ONDANSETRON HCL 4 MG PO TABS
4.0000 mg | ORAL_TABLET | Freq: Once | ORAL | Status: AC
  Administered 2017-02-19: 4 mg via ORAL
  Filled 2017-02-19: qty 1

## 2017-02-19 MED ORDER — TRAMADOL HCL 50 MG PO TABS: 50.0000 mg | ORAL_TABLET | Freq: Four times a day (QID) | ORAL | 0 refills | Status: DC | PRN

## 2017-02-19 MED ORDER — CYCLOBENZAPRINE HCL 10 MG PO TABS
10.0000 mg | ORAL_TABLET | Freq: Once | ORAL | Status: AC
  Administered 2017-02-19: 10 mg via ORAL
  Filled 2017-02-19: qty 1
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
## 2017-02-19 NOTE — Discharge Instructions (Signed)
Your x-ray is negative for fracture or dislocation. Your vital signs are well within normal limits. Please use Flexeril 3 times daily for spasm pain. May use Ultram every 6 hours if needed for pain. Both of these medications may cause drowsiness, please do not drink, drive, operate machinery, or participated in activities requiring concentration when taking these medications.
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
## 2017-03-27 ENCOUNTER — Telehealth: Payer: Self-pay | Admitting: *Deleted

## 2017-03-27 NOTE — Telephone Encounter (Signed)
Received call from patient.   Requested refill on Hydrocodone.   Ok to refill??  Last office visit/ refill 02/04/2017.

## 2017-03-28 MED ORDER — HYDROCODONE-ACETAMINOPHEN 5-325 MG PO TABS: 1.0000 | ORAL_TABLET | Freq: Four times a day (QID) | ORAL | 0 refills | Status: DC | PRN

## 2017-03-28 NOTE — Telephone Encounter (Signed)
okay

## 2017-03-28 NOTE — Telephone Encounter (Signed)
Prescription printed and patient made aware to come to office to pick up after 2pm on 03/28/2017.

## 2017-04-07 ENCOUNTER — Encounter: Payer: Self-pay | Admitting: Family Medicine

## 2017-05-17 ENCOUNTER — Other Ambulatory Visit: Payer: Self-pay | Admitting: Family Medicine

## 2017-06-05 ENCOUNTER — Telehealth: Payer: Self-pay | Admitting: *Deleted

## 2017-06-05 NOTE — Telephone Encounter (Signed)
Received call from patient.   Requested refill on Hydrocodone.   Ok to refill??  Last office visit 02/04/2017.  Last refill 03/28/2017.

## 2017-06-06 MED ORDER — HYDROCODONE-ACETAMINOPHEN 5-325 MG PO TABS: 1.0000 | ORAL_TABLET | Freq: Four times a day (QID) | ORAL | 0 refills | Status: DC | PRN

## 2017-06-06 NOTE — Telephone Encounter (Signed)
Prescription printed and patient made aware to come to office to pick up per VM.  

## 2017-06-06 NOTE — Telephone Encounter (Signed)
Okay to refill? 

## 2017-07-08 ENCOUNTER — Telehealth: Payer: Self-pay | Admitting: *Deleted

## 2017-07-08 NOTE — Telephone Encounter (Signed)
Change to losartan HCTZ 100-25mg  daily

## 2017-07-08 NOTE — Telephone Encounter (Signed)
Received call from patient.  Requested MD to advise alternative for Valsartan D/T recall.   MD please advise.

## 2017-07-10 ENCOUNTER — Other Ambulatory Visit: Payer: Self-pay | Admitting: *Deleted

## 2017-07-10 DIAGNOSIS — I10 Essential (primary) hypertension: Secondary | ICD-10-CM

## 2017-07-10 MED ORDER — LOSARTAN POTASSIUM-HCTZ 100-25 MG PO TABS: 1.0000 | ORAL_TABLET | Freq: Every day | ORAL | 3 refills | Status: DC

## 2017-07-14 NOTE — Telephone Encounter (Signed)
Call placed to patient and patient made aware.  

## 2017-08-01 ENCOUNTER — Telehealth: Payer: Self-pay | Admitting: Family Medicine

## 2017-08-01 MED ORDER — HYDROCODONE-ACETAMINOPHEN 5-325 MG PO TABS: 1.0000 | ORAL_TABLET | Freq: Four times a day (QID) | ORAL | 0 refills | Status: DC | PRN

## 2017-08-01 NOTE — Telephone Encounter (Signed)
Prescription printed and patient made aware to come to office to pick up after 4:30pm on 08/01/2017. 

## 2017-08-01 NOTE — Telephone Encounter (Signed)
Ok to refill??  Last office visit 02/04/2017.  Last refill 06/06/2017.

## 2017-08-01 NOTE — Telephone Encounter (Signed)
Pt needs refill hydrocodone.

## 2017-08-01 NOTE — Telephone Encounter (Signed)
Okay to refill? 

## 2017-08-04 ENCOUNTER — Ambulatory Visit: Payer: Medicare Other | Admitting: Family Medicine

## 2017-08-04 ENCOUNTER — Telehealth: Payer: Self-pay | Admitting: Family Medicine

## 2017-08-04 NOTE — Telephone Encounter (Signed)
Forms noted to be home visit questionnaires from insurance company. Routed to provider.

## 2017-08-04 NOTE — Telephone Encounter (Signed)
Pt dropped off forms for home health

## 2017-08-04 NOTE — Telephone Encounter (Signed)
Awaiting forms

## 2017-08-05 ENCOUNTER — Ambulatory Visit: Payer: Medicare Other | Admitting: Family Medicine

## 2017-08-06 ENCOUNTER — Ambulatory Visit: Payer: Medicare Other | Admitting: Family Medicine

## 2017-08-13 ENCOUNTER — Ambulatory Visit: Payer: Medicare Other | Admitting: Family Medicine

## 2017-08-18 ENCOUNTER — Encounter: Payer: Self-pay | Admitting: Family Medicine

## 2017-08-20 ENCOUNTER — Encounter: Payer: Self-pay | Admitting: Family Medicine

## 2017-08-27 ENCOUNTER — Encounter: Payer: Self-pay | Admitting: Family Medicine

## 2017-08-27 ENCOUNTER — Ambulatory Visit (INDEPENDENT_AMBULATORY_CARE_PROVIDER_SITE_OTHER): Payer: Medicare Other | Admitting: Family Medicine

## 2017-08-27 VITALS — BP 130/80 | HR 86 | Temp 98.2°F | Resp 16 | Ht 64.0 in | Wt 190.0 lb

## 2017-08-27 DIAGNOSIS — G8929 Other chronic pain: Secondary | ICD-10-CM

## 2017-08-27 DIAGNOSIS — Z6832 Body mass index (BMI) 32.0-32.9, adult: Secondary | ICD-10-CM | POA: Diagnosis not present

## 2017-08-27 DIAGNOSIS — Z23 Encounter for immunization: Secondary | ICD-10-CM

## 2017-08-27 DIAGNOSIS — M542 Cervicalgia: Secondary | ICD-10-CM

## 2017-08-27 DIAGNOSIS — I10 Essential (primary) hypertension: Secondary | ICD-10-CM | POA: Diagnosis not present

## 2017-08-27 DIAGNOSIS — Z720 Tobacco use: Secondary | ICD-10-CM | POA: Diagnosis not present

## 2017-08-27 DIAGNOSIS — M5136 Other intervertebral disc degeneration, lumbar region: Secondary | ICD-10-CM

## 2017-08-27 DIAGNOSIS — R062 Wheezing: Secondary | ICD-10-CM | POA: Diagnosis not present

## 2017-08-27 DIAGNOSIS — E6609 Other obesity due to excess calories: Secondary | ICD-10-CM

## 2017-08-27 DIAGNOSIS — E781 Pure hyperglyceridemia: Secondary | ICD-10-CM | POA: Insufficient documentation

## 2017-08-27 LAB — LIPID PANEL
Cholesterol: 177 mg/dL
HDL: 56 mg/dL
LDL Cholesterol (Calc): 95 mg/dL
Non-HDL Cholesterol (Calc): 121 mg/dL
Total CHOL/HDL Ratio: 3.2 (calc)
Triglycerides: 159 mg/dL — ABNORMAL HIGH

## 2017-08-27 LAB — COMPREHENSIVE METABOLIC PANEL
AG RATIO: 1.4 (calc) (ref 1.0–2.5)
ALBUMIN MSPROF: 4.3 g/dL (ref 3.6–5.1)
ALT: 12 U/L (ref 6–29)
AST: 16 U/L (ref 10–35)
Alkaline phosphatase (APISO): 85 U/L (ref 33–130)
BUN: 8 mg/dL (ref 7–25)
CHLORIDE: 100 mmol/L (ref 98–110)
CO2: 27 mmol/L (ref 20–32)
Calcium: 9.5 mg/dL (ref 8.6–10.4)
Creat: 0.68 mg/dL (ref 0.50–0.99)
GLOBULIN: 3 g/dL (ref 1.9–3.7)
GLUCOSE: 90 mg/dL (ref 65–99)
POTASSIUM: 3.9 mmol/L (ref 3.5–5.3)
SODIUM: 138 mmol/L (ref 135–146)
TOTAL PROTEIN: 7.3 g/dL (ref 6.1–8.1)
Total Bilirubin: 1.1 mg/dL (ref 0.2–1.2)

## 2017-08-27 MED ORDER — MECLIZINE HCL 12.5 MG PO TABS: 12.5000 mg | ORAL_TABLET | Freq: Three times a day (TID) | ORAL | 0 refills | Status: DC | PRN

## 2017-08-27 MED ORDER — HYDROCODONE-ACETAMINOPHEN 5-325 MG PO TABS: 1.0000 | ORAL_TABLET | Freq: Four times a day (QID) | ORAL | 0 refills | Status: DC | PRN

## 2017-08-27 MED ORDER — ALBUTEROL SULFATE HFA 108 (90 BASE) MCG/ACT IN AERS: 2.0000 | INHALATION_SPRAY | Freq: Four times a day (QID) | RESPIRATORY_TRACT | 1 refills | Status: DC | PRN

## 2017-08-27 NOTE — Patient Instructions (Addendum)
F/U 6 months for Physical  Referral to Dr. Lorin Mercy  Pulmonary function test to be done You need to quit

## 2017-08-27 NOTE — Progress Notes (Signed)
   Subjective:    Patient ID: Kathy Barr, female    DOB: 1956-04-25, 61 y.o.   MRN: [id number]  Patient presents for Follow-up (is fasting)  Pt here to f/u chronic medical problems. Medications reviewed HTN- taking losartan HCTZ as prescried, no side effects, also on ASA  Chronic back pain/ DDD/OA hip- takes norco as needed and flexeril.Assessment increased neck pain still gets pain into both arms she was told that she will need another surgery but has not followed up with Dr. Inda Merlin she needs a referral to go back to him.  Hypertriglycerdiea- TG 244 in Feb 2018,dietary changes discussed , weight down 6lbs    Has used inhaler a few times - Still short of breath and wheezy at times. She does continue to smoke about half a pack per day. Due for flu shot   Review Of Systems:  GEN- denies fatigue, fever, weight loss,weakness, recent illness HEENT- denies eye drainage, change in vision, nasal discharge, CVS- denies chest pain, palpitations RESP- denies SOB, cough, wheeze ABD- denies N/V, change in stools, abd pain GU- denies dysuria, hematuria, dribbling, incontinence MSK- + joint pain, muscle aches, injury Neuro- denies headache, dizziness, syncope, seizure activity       Objective:    BP 130/80   Pulse 86   Temp 98.2 F (36.8 C) (Oral)   Resp 16   Ht 5\' 4"  (1.626 m)   Wt 190 lb (86.2 kg)   SpO2 96%   BMI 32.61 kg/m  GEN- NAD, alert and oriented x3 HEENT- PERRL, EOMI, non injected sclera, pink conjunctiva, MMM, oropharynx clear Neck- Supple, fair ROM CVS- RRR, no murmur RESP-CTAB EXT- No edema Pulses- Radial 2+        Assessment & Plan:      Problem List Items Addressed This Visit      Unprioritized   Obesity - Primary   DDD (degenerative disc disease), lumbar   Relevant Medications   HYDROcodone-acetaminophen (NORCO) 5-325 MG tablet   Other Relevant Orders   Ambulatory referral to Orthopedic Surgery   Tobacco use    Tobacco Cessation She Has Not Wanted  Try Patches or Any Other Medication. I Am Concerned with Her Wheezing Episodes That She May Have Underlying COPD Therefore PFT Will Be Arranged      Relevant Orders   Pulmonary function test   Hypertriglyceridemia    Recheck TG, she has changed       Relevant Orders   Comprehensive metabolic panel   Lipid panel   Essential hypertension, benign    Well controlled no changes       Relevant Orders   Comprehensive metabolic panel   Chronic neck pain    Referral back to Dr. Lorin Mercy       Relevant Medications   HYDROcodone-acetaminophen ([place]) 5-325 MG tablet   Other Relevant Orders   Ambulatory referral to Orthopedic Surgery    Other Visit Diagnoses    Wheezing       Relevant Orders   Pulmonary function test   Need for immunization against influenza       Relevant Orders   Flu Vaccine QUAD 36+ mos IM (Completed)      Note: This dictation was prepared with Dragon dictation along with smaller phrase technology. Any transcriptional errors that result from this process are unintentional.

## 2017-08-27 NOTE — Assessment & Plan Note (Signed)
Tobacco Cessation She Has Not Wanted Try Patches or Any Other Medication. I Am Concerned with Her Wheezing Episodes That She May Have Underlying COPD Therefore PFT Will Be Arranged

## 2017-08-27 NOTE — Assessment & Plan Note (Signed)
Recheck TG, she has changed

## 2017-08-27 NOTE — Assessment & Plan Note (Signed)
Well controlled no changes 

## 2017-08-27 NOTE — Assessment & Plan Note (Signed)
Referral back to Dr. Lorin Mercy

## 2017-09-11 ENCOUNTER — Ambulatory Visit (INDEPENDENT_AMBULATORY_CARE_PROVIDER_SITE_OTHER): Payer: Medicare Other | Admitting: Orthopaedic Surgery

## 2017-09-18 ENCOUNTER — Ambulatory Visit (INDEPENDENT_AMBULATORY_CARE_PROVIDER_SITE_OTHER): Payer: Medicare Other | Admitting: Orthopaedic Surgery

## 2017-09-24 ENCOUNTER — Encounter (HOSPITAL_COMMUNITY): Payer: Medicare Other

## 2017-10-02 ENCOUNTER — Encounter (INDEPENDENT_AMBULATORY_CARE_PROVIDER_SITE_OTHER): Payer: Self-pay | Admitting: Orthopaedic Surgery

## 2017-10-02 ENCOUNTER — Ambulatory Visit (INDEPENDENT_AMBULATORY_CARE_PROVIDER_SITE_OTHER): Payer: Medicare Other | Admitting: Orthopaedic Surgery

## 2017-10-02 VITALS — BP 137/87 | HR 75 | Ht 65.0 in | Wt 189.0 lb

## 2017-10-02 DIAGNOSIS — Z981 Arthrodesis status: Secondary | ICD-10-CM

## 2017-10-02 DIAGNOSIS — M47812 Spondylosis without myelopathy or radiculopathy, cervical region: Secondary | ICD-10-CM

## 2017-10-02 NOTE — Progress Notes (Addendum)
Office Visit Note   Patient: Kathy Barr           Date of Birth: 03/25/1956           MRN: [id number] Visit Date: 10/02/2017              Requested by: Alycia Rossetti, MD [address] [place], [place] [address] PCP: Alycia Rossetti, MD   Assessment & Plan: Visit Diagnoses:  1. S/P cervical spinal fusion   2. Spondylosis without myelopathy or radiculopathy, cervical region     Plan: Patient states she like proceed with cervical MRI since  and she feels her neck symptoms have progressed. She has anterolisthesis at C4-5 on plain radiograph from previous MRI scan a few years ago showed mild stenosis at C4-5 C5-6 with solid C6-7 fusion.  Follow-Up Instructions: Return in about 3 weeks (around 10/23/2017).   Orders:  Orders Placed This Encounter  Procedures  . MR Cervical Spine w/o contrast   No orders of the defined types were placed in this encounter.     Procedures: No procedures performed   Clinical Data: No additional findings.   Subjective: Chief Complaint  Patient presents with  . Neck - Pain    HPI patient returns are not seen her in several years states stemming increasing neck pain pain radiates in her shoulders pain wakes her up at night she has problems when she drives or holds objects. More symptoms on the right than left. Past history of problems with adhesive capsulitis and responded with injection and therapy. She was scheduled for surgery in 2015 but canceled her surgery. Her symptoms are now progressed up over she states she is ready to reconsider cervical fusion.  Review of Systems He was systems positive for hip osteoarthritis, lumbar disc degeneration. Obesity IBS, vertigo, hypercholesterolemia, tobacco use, previous C6-7 cervical fusion. Otherwise negative as it pertains history of present illness.  Objective: Vital Signs: BP 137/87   Pulse 75   Ht 5\' 5"  (1.651 m)   Wt 189 lb (85.7 kg)   BMI 31.45 kg/m   Physical Exam    Constitutional: She is oriented to person, place, and time. She appears well-developed.  HENT:  Head: Normocephalic.  Right Ear: External ear normal.  Left Ear: External ear normal.  Eyes: Pupils are equal, round, and reactive to light.  Neck: No tracheal deviation present. No thyromegaly present.  Cardiovascular: Normal rate.   Pulmonary/Chest: Effort normal.  Abdominal: Soft.  Neurological: She is alert and oriented to person, place, and time.  Skin: Skin is warm and dry.  Psychiatric: She has a normal mood and affect. Her behavior is normal.    Ortho Exam patient bilateral brachial plexus tenderness well-healed anterior cervical incision on the left side of her neck for C6-7 fusion. No subclavicular lymphadenopathy. Positive Spurling both right and left. Negative for me. Biceps triceps radialis are 2+ and symmetrical. She has some crepitus with knee extension both knees. No pain with hip range of motion well-healed hip incision. No pedal edema. Anterior tib EHL is intact. Normal heel toe gait without limping.   Specialty Comments:  No specialty comments available.  Imaging: No results found.   PMFS History: Patient Active Problem List   Diagnosis Date Noted  . Hypertriglyceridemia 08/27/2017  . Vertigo 02/04/2017  . Tobacco use 02/04/2017  . Frozen shoulder 10/24/2015  . Atrophic vulvovaginitis 06/05/2015  . DDD (degenerative disc disease), lumbar 01/13/2015  . IBS (irritable bowel syndrome) 07/12/2014  .  Bowel habit changes 01/12/2014  . Osteoarthritis of right hip 03/16/2013    Class: Chronic  . Generalized OA 10/29/2012  . Essential hypertension, benign 10/29/2012  . Obesity 10/29/2012  . Chronic neck pain 10/29/2012   Past Medical History:  Diagnosis Date  . Arthritis    "all over" (03/18/2013)  . Carpal tunnel syndrome    "right" (03/18/2013)  . Fracture 2013   RLE  . GERD (gastroesophageal reflux disease)    OTC  . Hypertension   . IBS (irritable bowel  syndrome)   . Osteoarthritis of left hip   . Seizures ([place]) 1960's; 1978   "when I was little; when I was 7 months pregnant" (03/18/2013)  . Shortness of breath    "not since I quit smoking" (03/18/2013)    Family History  Problem Relation Age of Onset  . Hypertension Father   . Diabetes Father   . Hypertension Sister   . Diabetes Sister   . Hypertension Brother   . Diabetes Brother   . Diabetes Mother   . Hypertension Mother   . Hyperlipidemia Mother   . COPD Brother   . Heart disease Brother   . Hypertension Brother     Past Surgical History:  Procedure Laterality Date  . ANTERIOR CERVICAL DECOMP/DISCECTOMY FUSION  ?2003  . CHOLECYSTECTOMY  2003  . COLONOSCOPY N/A 02/17/2014   Procedure: COLONOSCOPY;  Surgeon: Rogene Houston, MD;  [facility];  Service: Endoscopy;  Laterality: N/A;  145-moved to 10:30 Ann to notify pt  . FOOT ARTHROTOMY Right 1990's   pin, after removing a bone   . JOINT REPLACEMENT Left    hip  . pinched nerve    . TOTAL HIP ARTHROPLASTY  06/30/2012   Procedure: TOTAL HIP ARTHROPLASTY;  Surgeon: Jessy Oto, MD;  Location: [facility];  Service: Orthopedics;  Laterality: Left;  Left total hip replacement with metal and polypropylene pore coated implants  . TOTAL HIP ARTHROPLASTY Right 03/16/2013   Procedure: RIGHT TOTAL HIP ARTHROPLASTY- right ;  Surgeon: Jessy Oto, MD;  Location: [facility];  Service: Orthopedics;  Laterality: Right;  . TUBAL LIGATION  1978  . VAGINAL HYSTERECTOMY  ~ 2003   Social History   Occupational History  . Not on file  Tobacco Use  . Smoking status: Current Every Day Smoker    Packs/day: 0.25    Years: 25.00    Pack years: 6.25    Types: Cigarettes  . Smokeless tobacco: Never Used  . Tobacco comment: 1-2 cigs per day  Substance and Sexual Activity  . Alcohol use: No    Alcohol/week: 0.0 oz  . Drug use: No  . Sexual activity: Yes

## 2017-10-02 NOTE — Addendum Note (Signed)
Addended by: Meyer Cory on: 10/02/2017 10:08 AM   Modules accepted: Orders

## 2017-10-03 ENCOUNTER — Encounter (HOSPITAL_COMMUNITY): Payer: Medicare Other

## 2017-10-10 ENCOUNTER — Ambulatory Visit (HOSPITAL_COMMUNITY)
Admission: RE | Admit: 2017-10-10 | Discharge: 2017-10-10 | Disposition: A | Discharge status: Home / Self Care | Payer: Medicare Other | Source: Ambulatory Visit | Attending: Orthopaedic Surgery | Admitting: Orthopaedic Surgery

## 2017-10-10 DIAGNOSIS — M47812 Spondylosis without myelopathy or radiculopathy, cervical region: Secondary | ICD-10-CM | POA: Insufficient documentation

## 2017-10-10 DIAGNOSIS — M4802 Spinal stenosis, cervical region: Secondary | ICD-10-CM | POA: Insufficient documentation

## 2017-10-10 DIAGNOSIS — M542 Cervicalgia: Secondary | ICD-10-CM | POA: Diagnosis not present

## 2017-10-10 DIAGNOSIS — Z981 Arthrodesis status: Secondary | ICD-10-CM | POA: Insufficient documentation

## 2017-10-16 ENCOUNTER — Encounter (INDEPENDENT_AMBULATORY_CARE_PROVIDER_SITE_OTHER): Payer: Self-pay | Admitting: Orthopaedic Surgery

## 2017-10-16 ENCOUNTER — Ambulatory Visit (INDEPENDENT_AMBULATORY_CARE_PROVIDER_SITE_OTHER): Payer: Medicare Other | Admitting: Orthopaedic Surgery

## 2017-10-16 VITALS — BP 135/83 | HR 75 | Ht 65.0 in | Wt 190.0 lb

## 2017-10-16 DIAGNOSIS — M9981 Other biomechanical lesions of cervical region: Secondary | ICD-10-CM | POA: Diagnosis not present

## 2017-10-16 DIAGNOSIS — M4802 Spinal stenosis, cervical region: Secondary | ICD-10-CM

## 2017-10-27 ENCOUNTER — Telehealth: Payer: Self-pay | Admitting: Family Medicine

## 2017-10-27 MED ORDER — HYDROCODONE-ACETAMINOPHEN 5-325 MG PO TABS: 1.0000 | ORAL_TABLET | Freq: Four times a day (QID) | ORAL | 0 refills | Status: DC | PRN

## 2017-10-27 NOTE — Telephone Encounter (Signed)
Prescription printed and patient made aware to come to office to pick up on 10/28/2017.  

## 2017-10-27 NOTE — Telephone Encounter (Signed)
Ok to refill??  Last office visit/ refill 08/27/2017.

## 2017-10-27 NOTE — Telephone Encounter (Signed)
okay

## 2017-10-27 NOTE — Telephone Encounter (Signed)
Patient calling to get rx for her hydrocodone  [phone]

## 2017-10-28 ENCOUNTER — Encounter (INDEPENDENT_AMBULATORY_CARE_PROVIDER_SITE_OTHER): Payer: Self-pay | Admitting: Orthopaedic Surgery

## 2017-10-28 NOTE — Progress Notes (Signed)
Office Visit Note   Patient: Kathy Barr           Date of Birth: 1956/12/08           MRN: [id number] Visit Date: 10/16/2017              Requested by: Alycia Rossetti, MD [address] [place], [place] [address] PCP: Alycia Rossetti, MD   Assessment & Plan: Visit Diagnoses:  1. Neural foraminal stenosis of cervical spine      C4-5, C5-6.   Plan: Patient had progressive neck symptoms.  She has long history of taking hydrocodone medication that dates back several years.  She states most painful thing she is having at this point is her cervical spine with activities.  It bothers her at night bothers her when she drives sometimes she drops objects with her hand.  We reviewed the MRI scan which showed progression of foraminal stenosis with spondylosis.  Plan would be to level cervical fusion removal of the old C6-7 plate.  Procedure discussed.  Risk of pseudoarthrosis, dysphasia, dysphonia, possible need for posterior surgery of the anterior procedure did not heel which is unlikely.  Risks were discussed questions were elicited and answered she understands and requests we proceed.  Follow-Up Instructions: No Follow-up on file.   Orders:  No orders of the defined types were placed in this encounter.  No orders of the defined types were placed in this encounter.     Procedures: No procedures performed   Clinical Data: No additional findings.   Subjective: Chief Complaint  Patient presents with  . Neck - Pain, Follow-up    MRI review    HPI the patient returns with progressive neck pain over several years that radiates in her shoulders and into her arms.  She states she has problems when she drives or holds objects in front of her.  She has more symptoms on the right than the left.  Previous C6-7 cervical fusion by Dr. Arnoldo Morale with satisfactory healing performed 15 years ago.  Current MRI scan shows progression of foraminal stenosis at C4-5 C5-6 with spondylolisthesis  at C5-6 and moderate severe facet arthritis worse on the right than the left.  She has moderate to severe foraminal stenosis.  Patient's has been through therapy anti-inflammatories prednisone Dosepak.  She has had cortisone injections in the past.  Review of Systems Positive for bilateral total hip arthroplasties.  Obesity, vertigo, increased cholesterol, long-term tobacco use.  Previous cervical fusion C6-7.  Negative for CVA, positive for previous right frozen shoulder, IBS, vertigo.  Otherwise negative as it pertains to HPI 14 point review of systems updated.  Objective: Vital Signs: BP 135/83   Pulse 75   Ht 5\' 5"  (1.651 m)   Wt 190 lb (86.2 kg)   BMI 31.62 kg/m   Physical Exam  Constitutional: She is oriented to person, place, and time. She appears well-developed.  HENT:  Head: Normocephalic.  Right Ear: External ear normal.  Left Ear: External ear normal.  Eyes: Pupils are equal, round, and reactive to light.  Neck: No tracheal deviation present. No thyromegaly present.  Cardiovascular: Normal rate.  Pulmonary/Chest: Effort normal.  Abdominal: Soft.  Neurological: She is alert and oriented to person, place, and time.  Skin: Skin is warm and dry.  Psychiatric: She has a normal mood and affect. Her behavior is normal.    Ortho Exam patient is rotation of the right 30% cervical spine abnormal.  She stops  due to pain.  Bilateral brachial plexus tenderness.  Rotation of the left is 50%.  Negative for me.  Upper extremity reflexes are 2+ and symmetrical.  Lower extremity hyperreflexia.  Biceps triceps are strong.  Limitation internal rotation right shoulder with hand back to posterior x-ray line.  She can abduct and flex without pain.  No limitation of flexion and abduction.  Long head of the biceps is normal.  Median nerve in the forearm and wrist ulnar nerve at the elbow are normal to palpation.  Specialty Comments:  No specialty comments available.  Imaging: CLINICAL DATA:   Chronic neck pain and bilateral shoulder pain. Previous anterior fusion at C6-7. Cervical spondylosis.  EXAM: MRI CERVICAL SPINE WITHOUT CONTRAST  TECHNIQUE: Multiplanar, multisequence MR imaging of the cervical spine was performed. No intravenous contrast was administered.  COMPARISON:  Radiographs dated 02/19/2017 and MRI dated 12/13/2013  FINDINGS: Alignment: Slight anterolisthesis at C4-5 and C5-6.  Vertebrae: Moderately severe right facet arthritis at C4-5 and C5-6. Moderate left facet arthritis at C3-4 and C5-6 and severe left facet arthritis at C4-5.  Cord: Normal signal and morphology.  Posterior Fossa, vertebral arteries, paraspinal tissues: Negative.  Disc levels:  C2-3:  Mild right facet arthritis.  Otherwise negative.  C3-4: Mild right and moderate left facet arthritis. Widely patent neural foramina. Minimal disc bulging and spurring to the left with no neural impingement.  C4-5: Severe bilateral facet arthritis. 2.5 mm spondylolisthesis. Slight narrowing of both neural foramina.  C5-6: Moderately severe right facet arthritis. Moderate left facet arthritis. 1 mm spondylolisthesis. Uncovertebral joint spurring on the right with moderately severe right foraminal stenosis. Widely patent left neural foramen.  C6-7: Solid anterior fusion with no residual impingement. Widely patent neural foramina.  C7-T1: Desiccation of the disc. No disc bulging or protrusion. Slight bilateral facet arthritis. No foraminal stenosis.  IMPRESSION: 1. Progressive bilateral facet arthritis at multiple levels. 2. Progressive right foraminal stenosis at C5-6. 3. Slight bilateral foraminal stenosis at C4-5.   Electronically Signed   By: Lorriane Shire M.D.   On: 10/10/2017 10:17    PMFS History: Patient Active Problem List   Diagnosis Date Noted  . Hypertriglyceridemia 08/27/2017  . Vertigo 02/04/2017  . Tobacco use 02/04/2017  . Frozen shoulder  10/24/2015  . Atrophic vulvovaginitis 06/05/2015  . DDD (degenerative disc disease), lumbar 01/13/2015  . IBS (irritable bowel syndrome) 07/12/2014  . Bowel habit changes 01/12/2014  . Osteoarthritis of right hip 03/16/2013    Class: Chronic  . Generalized OA 10/29/2012  . Essential hypertension, benign 10/29/2012  . Obesity 10/29/2012  . Chronic neck pain 10/29/2012   Past Medical History:  Diagnosis Date  . Arthritis    "all over" (03/18/2013)  . Carpal tunnel syndrome    "right" (03/18/2013)  . Fracture 2013   RLE  . GERD (gastroesophageal reflux disease)    OTC  . Hypertension   . IBS (irritable bowel syndrome)   . Osteoarthritis of left hip   . Seizures ([place]) 1960's; 1978   "when I was little; when I was 7 months pregnant" (03/18/2013)  . Shortness of breath    "not since I quit smoking" (03/18/2013)    Family History  Problem Relation Age of Onset  . Hypertension Father   . Diabetes Father   . Hypertension Sister   . Diabetes Sister   . Hypertension Brother   . Diabetes Brother   . Diabetes Mother   . Hypertension Mother   . Hyperlipidemia Mother   .  COPD Brother   . Heart disease Brother   . Hypertension Brother     Past Surgical History:  Procedure Laterality Date  . ANTERIOR CERVICAL DECOMP/DISCECTOMY FUSION  ?2003  . CHOLECYSTECTOMY  2003  . COLONOSCOPY N/A 02/17/2014   Procedure: COLONOSCOPY;  Surgeon: Rogene Houston, MD;  [facility];  Service: Endoscopy;  Laterality: N/A;  145-moved to 10:30 Ann to notify pt  . FOOT ARTHROTOMY Right 1990's   pin, after removing a bone   . JOINT REPLACEMENT Left    hip  . pinched nerve    . TOTAL HIP ARTHROPLASTY  06/30/2012   Procedure: TOTAL HIP ARTHROPLASTY;  Surgeon: Jessy Oto, MD;  Location: [facility];  Service: Orthopedics;  Laterality: Left;  Left total hip replacement with metal and polypropylene pore coated implants  . TOTAL HIP ARTHROPLASTY Right 03/16/2013   Procedure: RIGHT TOTAL HIP  ARTHROPLASTY- right ;  Surgeon: Jessy Oto, MD;  Location: [facility];  Service: Orthopedics;  Laterality: Right;  . TUBAL LIGATION  1978  . VAGINAL HYSTERECTOMY  ~ 2003   Social History   Occupational History  . Not on file  Tobacco Use  . Smoking status: Current Every Day Smoker    Packs/day: 0.25    Years: 25.00    Pack years: 6.25    Types: Cigarettes  . Smokeless tobacco: Never Used  . Tobacco comment: 1-2 cigs per day  Substance and Sexual Activity  . Alcohol use: No    Alcohol/week: 0.0 oz  . Drug use: No  . Sexual activity: Yes

## 2017-11-21 ENCOUNTER — Ambulatory Visit: Admit: 2017-11-21 | Payer: Medicare Other | Admitting: Orthopaedic Surgery

## 2017-11-21 SURGERY — ANTERIOR CERVICAL DECOMPRESSION/DISCECTOMY FUSION 1 LEVEL/HARDWARE REMOVAL
Anesthesia: General

## 2017-12-16 ENCOUNTER — Other Ambulatory Visit: Payer: Self-pay | Admitting: Family Medicine

## 2017-12-16 MED ORDER — HYDROCODONE-ACETAMINOPHEN 5-325 MG PO TABS: 1.0000 | ORAL_TABLET | Freq: Four times a day (QID) | ORAL | 0 refills | Status: DC | PRN

## 2017-12-16 NOTE — Telephone Encounter (Signed)
Ok to refill??  Last office visit 08/27/2017.  Last refill 10/27/2017.

## 2017-12-16 NOTE — Telephone Encounter (Signed)
[facility] [place]  Patient is calling to get refill on her hydrocodone

## 2018-01-28 ENCOUNTER — Other Ambulatory Visit: Payer: Self-pay | Admitting: Family Medicine

## 2018-01-28 DIAGNOSIS — Z1231 Encounter for screening mammogram for malignant neoplasm of breast: Secondary | ICD-10-CM

## 2018-01-30 ENCOUNTER — Ambulatory Visit (HOSPITAL_COMMUNITY): Payer: Medicare Other

## 2018-02-04 ENCOUNTER — Other Ambulatory Visit: Payer: Self-pay | Admitting: *Deleted

## 2018-02-04 MED ORDER — HYDROCODONE-ACETAMINOPHEN 5-325 MG PO TABS: 1.0000 | ORAL_TABLET | Freq: Four times a day (QID) | ORAL | 0 refills | Status: DC | PRN

## 2018-02-04 NOTE — Telephone Encounter (Signed)
Received call from patient.   Requested refill on Hydrocodone/APAP.   Ok to refill??  Last office visit 08/27/2017.  Last refill 12/16/2017.

## 2018-02-05 ENCOUNTER — Telehealth (INDEPENDENT_AMBULATORY_CARE_PROVIDER_SITE_OTHER): Payer: Self-pay | Admitting: Orthopaedic Surgery

## 2018-02-05 NOTE — Telephone Encounter (Signed)
Patient calling to proceed with NECK SURGERY.  The following was scheduled back in December, but family wanted he to hold off till after Christmas.    C6-7 PLATE REMOVAL, L9-5 AND C5-6 ANTERIOR CERVICAL DISCECTOMY AND FUSION, ALLOGRAFT, PLATE  Can I get a surgery sheet?    cb  [phone] or [phone]

## 2018-02-05 NOTE — Telephone Encounter (Signed)
Please advise 

## 2018-02-05 NOTE — Telephone Encounter (Signed)
Found canceled surgery sheet for C6-7 plate removal and H8-4, C5-6 ACD&F.  We should be able to reschedule from this.

## 2018-02-05 NOTE — Telephone Encounter (Signed)
noted 

## 2018-02-09 NOTE — Telephone Encounter (Signed)
Patient is scheduled for neck surgery w/Dr. Lorin Mercy  02-18-18 @ 12:30pm [facility]

## 2018-02-12 ENCOUNTER — Ambulatory Visit (HOSPITAL_COMMUNITY)
Admission: RE | Admit: 2018-02-12 | Discharge: 2018-02-12 | Disposition: A | Discharge status: Home / Self Care | Payer: Medicare Other | Source: Ambulatory Visit | Attending: Family Medicine | Admitting: Family Medicine

## 2018-02-12 DIAGNOSIS — Z1231 Encounter for screening mammogram for malignant neoplasm of breast: Secondary | ICD-10-CM | POA: Insufficient documentation

## 2018-02-16 ENCOUNTER — Encounter (HOSPITAL_COMMUNITY): Payer: Self-pay

## 2018-02-16 ENCOUNTER — Other Ambulatory Visit: Payer: Self-pay

## 2018-02-16 ENCOUNTER — Encounter (HOSPITAL_COMMUNITY)
Admission: RE | Admit: 2018-02-16 | Discharge: 2018-02-16 | Disposition: A | Discharge status: Home / Self Care | Payer: Medicare Other | Source: Ambulatory Visit | Attending: Orthopaedic Surgery | Admitting: Orthopaedic Surgery

## 2018-02-16 ENCOUNTER — Ambulatory Visit (HOSPITAL_COMMUNITY)
Admission: RE | Admit: 2018-02-16 | Discharge: 2018-02-16 | Disposition: A | Discharge status: Home / Self Care | Payer: Medicare Other | Source: Ambulatory Visit | Attending: Surgery | Admitting: Surgery

## 2018-02-16 ENCOUNTER — Telehealth (INDEPENDENT_AMBULATORY_CARE_PROVIDER_SITE_OTHER): Payer: Self-pay | Admitting: Radiology

## 2018-02-16 DIAGNOSIS — Z01818 Encounter for other preprocedural examination: Secondary | ICD-10-CM | POA: Insufficient documentation

## 2018-02-16 DIAGNOSIS — Z01812 Encounter for preprocedural laboratory examination: Secondary | ICD-10-CM | POA: Diagnosis not present

## 2018-02-16 DIAGNOSIS — J984 Other disorders of lung: Secondary | ICD-10-CM | POA: Diagnosis not present

## 2018-02-16 DIAGNOSIS — R918 Other nonspecific abnormal finding of lung field: Secondary | ICD-10-CM | POA: Diagnosis not present

## 2018-02-16 DIAGNOSIS — Z01810 Encounter for preprocedural cardiovascular examination: Secondary | ICD-10-CM | POA: Diagnosis not present

## 2018-02-16 DIAGNOSIS — R911 Solitary pulmonary nodule: Secondary | ICD-10-CM

## 2018-02-16 LAB — BASIC METABOLIC PANEL
Anion gap: 10 (ref 5–15)
BUN: 8 mg/dL (ref 6–20)
CHLORIDE: 102 mmol/L (ref 101–111)
CO2: 26 mmol/L (ref 22–32)
CREATININE: 0.74 mg/dL (ref 0.44–1.00)
Calcium: 9.2 mg/dL (ref 8.9–10.3)
GFR calc non Af Amer: >60 mL/min (ref >60)
Glucose, Bld: 94 mg/dL (ref 65–99)
Potassium: 3.4 mmol/L — ABNORMAL LOW (ref 3.5–5.1)
Sodium: 138 mmol/L (ref 135–145)

## 2018-02-16 LAB — CBC
HEMATOCRIT: 40.9 % (ref 36.0–46.0)
HEMOGLOBIN: 13.7 g/dL (ref 12.0–15.0)
MCH: 28.6 pg (ref 26.0–34.0)
MCHC: 33.5 g/dL (ref 30.0–36.0)
MCV: 85.4 fL (ref 78.0–100.0)
Platelets: 204 10*3/uL (ref 150–400)
RBC: 4.79 MIL/uL (ref 3.87–5.11)
RDW: 13.3 % (ref 11.5–15.5)
WBC: 6.4 10*3/uL (ref 4.0–10.5)

## 2018-02-16 LAB — SURGICAL PCR SCREEN
MRSA, PCR: NEGATIVE
STAPHYLOCOCCUS AUREUS: NEGATIVE

## 2018-02-16 NOTE — Pre-Procedure Instructions (Signed)
Kathy Barr  02/16/2018      [facility] - [place], [place] - [id number] [place] [address] Phone: [phone] Fax: [phone]  [facility] [place], [place] [address] [place] [address] Phone: [phone] Fax: [phone]    Your procedure is scheduled on Wednesday, March 13th   Report to [facility] Admitting at 10:45 AM             (posted surgery time 12:34p - 2:50p)   Call this number if you have problems the morning of surgery:  [phone]   Remember:              As of today, STOP TAKING ANY vitamins, herbal supplements, anti-inflammatories.   Do not eat food or drink liquids after midnight, Tuesday.   Take these medicines the morning of surgery with A SIP OF WATER : Hydrocodone.  Please use your inhalers that morning.   Do not wear jewelry, make-up or nail polish.  Do not wear lotions, powders, perfumes, or deodorant.  Do not shave 48 hours prior to surgery.   Do not bring valuables to the hospital.  [facility] is not responsible for any belongings or valuables.  Contacts, dentures or bridgework may not be worn into surgery.  Leave your suitcase in the car.  After surgery it may be brought to your room.  For patients admitted to the hospital, discharge time will be determined by your treatment team.  Please read over the following fact sheets that you were given. Pain Booklet, MRSA Information and Surgical Site Infection Prevention      [facility]- Preparing For Surgery  Before surgery, you can play an important role. Because skin is not sterile, your skin needs to be as free of germs as possible. You can reduce the number of germs on your skin by washing with CHG (chlorahexidine gluconate) Soap before surgery.  CHG is an antiseptic cleaner which kills germs and bonds with the skin to continue killing germs even after washing.  Please do not  use if you have an allergy to CHG or antibacterial soaps. If your skin becomes reddened/irritated stop using the CHG.  Do not shave (including legs and underarms) for at least 48 hours prior to first CHG shower. It is OK to shave your face.  Please follow these instructions carefully.   1. Shower the NIGHT BEFORE SURGERY and the MORNING OF SURGERY with CHG.   2. If you chose to wash your hair, wash your hair first as usual with your normal shampoo.  3. After you shampoo, rinse your hair and body thoroughly to remove the shampoo.  4. Use CHG as you would any other liquid soap. You can apply CHG directly to the skin and wash gently with a scrungie or a clean washcloth.   5. Apply the CHG Soap to your body ONLY FROM THE NECK DOWN.  Do not use on open wounds or open sores. Avoid contact with your eyes, ears, mouth and genitals (private parts). Wash Face and genitals (private parts)  with your normal soap.  6. Wash thoroughly, paying special attention to the area where your surgery will be performed.  7. Thoroughly rinse your body with warm water from the neck down.  8. DO NOT shower/wash with your normal soap after using and rinsing off the CHG Soap.  9. Pat yourself dry with  a CLEAN TOWEL.  10. Wear CLEAN PAJAMAS to bed the night before surgery, wear comfortable clothes the morning of surgery  11. Place CLEAN SHEETS on your bed the night of your first shower and DO NOT SLEEP WITH PETS.    Day of Surgery: Do not apply any deodorants/lotions. Please wear clean clothes to the hospital/surgery center.

## 2018-02-16 NOTE — Telephone Encounter (Signed)
[facility]iology called Kathy Barr, [place] with call report on patient's pre-op chest x-ray. Patient has a new assymetric density left lung apex which possibly could be a mass lesion or infiltrate. Recommend CT Chest.  I called Benjiman Core, PA-C to advise.  He had already left OR. Message sent to Dr. Lorin Mercy to advise.

## 2018-02-16 NOTE — Progress Notes (Signed)
PCP is Dr. Raliegh Ip. Kathy Barr  LOV 01/2017 Denies murmur, sob, palpitations., any cardiac testing nor seeing a cardio.

## 2018-02-17 NOTE — Addendum Note (Signed)
Addended by: Meyer Cory on: 02/17/2018 03:11 PM   Modules accepted: Orders

## 2018-02-17 NOTE — Addendum Note (Signed)
Addended by: Meyer Cory on: 02/17/2018 01:57 PM   Modules accepted: Orders

## 2018-02-17 NOTE — Telephone Encounter (Signed)
Order entered into system.  fyi on cancellation Debbie.

## 2018-02-17 NOTE — Telephone Encounter (Signed)
I called pt, discussed option of proceeding with surgery vs . Reschedule. Cancel surgery, see if Malachy Mood /Debbie can put something else there , order CT at [facility] hospital and then we can reschedule surgery based on findings. thanks

## 2018-02-17 NOTE — Telephone Encounter (Signed)
Please advise.  Would you like for me to enter order for CT?

## 2018-02-18 ENCOUNTER — Encounter (HOSPITAL_COMMUNITY): Admission: RE | Payer: Self-pay | Source: Ambulatory Visit

## 2018-02-18 ENCOUNTER — Ambulatory Visit (HOSPITAL_COMMUNITY): Admission: RE | Admit: 2018-02-18 | Payer: Medicare Other | Source: Ambulatory Visit | Admitting: Orthopaedic Surgery

## 2018-02-18 SURGERY — ANTERIOR CERVICAL DECOMPRESSION/DISCECTOMY FUSION 2 LEVEL/HARDWARE REMOVAL
Anesthesia: General

## 2018-02-19 ENCOUNTER — Telehealth (INDEPENDENT_AMBULATORY_CARE_PROVIDER_SITE_OTHER): Payer: Self-pay | Admitting: *Deleted

## 2018-02-19 NOTE — Telephone Encounter (Signed)
Pt is scheduled for CT chest w contrast at [facility] on March 27th at 5:00 pm, pt is to arrive 15 mins early to register. I C left message on vm for pt to return call for appt information.

## 2018-02-19 NOTE — Telephone Encounter (Signed)
Pt is have liquid only 4 hrs prior to appt.

## 2018-02-25 NOTE — Telephone Encounter (Signed)
Pt aware of appt and will contact [place] office to schedule follow up

## 2018-03-02 LAB — GLUCOSE, POCT (MANUAL RESULT ENTRY): POC Glucose: 137 mg/dl — AB (ref 70–99)

## 2018-03-04 ENCOUNTER — Ambulatory Visit (HOSPITAL_COMMUNITY)
Admission: RE | Admit: 2018-03-04 | Discharge: 2018-03-04 | Disposition: A | Discharge status: Home / Self Care | Payer: Medicare Other | Source: Ambulatory Visit | Attending: Orthopaedic Surgery | Admitting: Orthopaedic Surgery

## 2018-03-04 DIAGNOSIS — R911 Solitary pulmonary nodule: Secondary | ICD-10-CM | POA: Insufficient documentation

## 2018-03-04 DIAGNOSIS — J984 Other disorders of lung: Secondary | ICD-10-CM | POA: Diagnosis not present

## 2018-03-04 MED ORDER — IOPAMIDOL (ISOVUE-300) INJECTION 61%
75.0000 mL | Freq: Once | INTRAVENOUS | Status: AC | PRN
  Administered 2018-03-04: 75 mL via INTRAVENOUS

## 2018-03-05 ENCOUNTER — Ambulatory Visit (INDEPENDENT_AMBULATORY_CARE_PROVIDER_SITE_OTHER): Payer: Medicare Other | Admitting: Orthopaedic Surgery

## 2018-03-05 ENCOUNTER — Encounter (INDEPENDENT_AMBULATORY_CARE_PROVIDER_SITE_OTHER): Payer: Self-pay | Admitting: Orthopaedic Surgery

## 2018-03-05 VITALS — BP 136/86 | HR 88

## 2018-03-05 DIAGNOSIS — M4722 Other spondylosis with radiculopathy, cervical region: Secondary | ICD-10-CM | POA: Diagnosis not present

## 2018-03-05 NOTE — Progress Notes (Signed)
Patient returns post CT scan of the chest due to abnormal chest x-ray.  CT scan shows no evidence of pulmonary lesion.  Patient been scheduled for 2 level cervical fusion at C4-5 C5-6 and also removal of previous plate at O3-7 and surgery was canceled and rescheduled due to the abnormal chest x-ray.  Patient's recently had some upper respiratory infection she is taking some medication for this.  She states she like to reschedule the surgery in a few weeks.  She continues to have pain and numbness that radiates into her hands.  Review of systems updated unchanged.  Patient still has brachial plexus tenderness positive Spurling.  No atrophy no rash over exposed skin.  Once patient gets over upper respiratory infection she can call about rescheduling.

## 2018-04-02 ENCOUNTER — Encounter (INDEPENDENT_AMBULATORY_CARE_PROVIDER_SITE_OTHER): Payer: Self-pay | Admitting: Surgery

## 2018-04-02 ENCOUNTER — Ambulatory Visit (INDEPENDENT_AMBULATORY_CARE_PROVIDER_SITE_OTHER): Payer: Medicare Other | Admitting: Surgery

## 2018-04-02 VITALS — BP 120/77 | HR 85 | Temp 97.1°F | Ht 65.0 in | Wt 194.0 lb

## 2018-04-02 DIAGNOSIS — M47812 Spondylosis without myelopathy or radiculopathy, cervical region: Secondary | ICD-10-CM

## 2018-04-02 DIAGNOSIS — Z981 Arthrodesis status: Secondary | ICD-10-CM

## 2018-04-02 NOTE — H&P (Signed)
Kathy Barr is an 62 y.o. female.   Chief Complaint: Neck pain, upper extremity radiculopathy HPI: Patient with history of C4-5 and C5-6 HNP/stenosis and retained hardware at C6-7 presents for preop H&P.  Progressive worsening symptoms.  Failed conservative treatment.  Past Medical History:  Diagnosis Date  . Arthritis    "all over" (03/18/2013)  . Carpal tunnel syndrome    "right" (03/18/2013)  . Fracture 2013   RLE  . GERD (gastroesophageal reflux disease)    OTC  . Hypertension   . IBS (irritable bowel syndrome)   . Osteoarthritis of left hip   . Seizures ([place]) 1960's; 1978   "when I was little; when I was 7 months pregnant" (03/18/2013)  . Shortness of breath    "not since I quit smoking" (03/18/2013)    Past Surgical History:  Procedure Laterality Date  . ANTERIOR CERVICAL DECOMP/DISCECTOMY FUSION  ?2003  . CHOLECYSTECTOMY  2003  . COLONOSCOPY N/A 02/17/2014   Procedure: COLONOSCOPY;  Surgeon: Rogene Houston, MD;  [facility];  Service: Endoscopy;  Laterality: N/A;  145-moved to 10:30 Ann to notify pt  . FOOT ARTHROTOMY Right 1990's   pin, after removing a bone   . JOINT REPLACEMENT Left    hip and right  . pinched nerve    . TOTAL HIP ARTHROPLASTY  06/30/2012   Procedure: TOTAL HIP ARTHROPLASTY;  Surgeon: Jessy Oto, MD;  Location: [facility];  Service: Orthopedics;  Laterality: Left;  Left total hip replacement with metal and polypropylene pore coated implants  . TOTAL HIP ARTHROPLASTY Right 03/16/2013   Procedure: RIGHT TOTAL HIP ARTHROPLASTY- right ;  Surgeon: Jessy Oto, MD;  Location: [facility];  Service: Orthopedics;  Laterality: Right;  . TUBAL LIGATION  1978  . VAGINAL HYSTERECTOMY  ~ 2003    Family History  Problem Relation Age of Onset  . Hypertension Father   . Diabetes Father   . Hypertension Sister   . Diabetes Sister   . Hypertension Brother   . Diabetes Brother   . Diabetes Mother   . Hypertension Mother   . Hyperlipidemia Mother   . COPD  Brother   . Heart disease Brother   . Hypertension Brother    Social History:  reports that she has been smoking cigarettes.  She has a 6.25 pack-year smoking history. She has never used smokeless tobacco. She reports that she does not drink alcohol or use drugs.  Allergies: No Known Allergies  No medications prior to admission.    No results found for this or any previous visit (from the past 48 hour(s)). No results found.  Review of Systems  Constitutional: Negative.   HENT: Negative.   Respiratory: Negative.   Cardiovascular: Negative.   Gastrointestinal: Negative.   Musculoskeletal: Positive for neck pain.  Skin: Negative.   Neurological: Positive for tingling.  Psychiatric/Behavioral: Negative.     There were no vitals taken for this visit. Physical Exam  Constitutional: She is oriented to person, place, and time. She appears well-developed. No distress.  HENT:  Head: Normocephalic and atraumatic.  Eyes: Pupils are equal, round, and reactive to light. EOM are normal.  Cardiovascular: Normal rate.  Respiratory: Effort normal. No respiratory distress. She has no wheezes.  GI: Soft. She exhibits no distension. There is no tenderness.  Musculoskeletal:   Ortho Exam patient is rotation of the right 30% cervical spine abnormal.  She stops due to pain.  Bilateral brachial plexus tenderness.  Rotation of the  left is 50%.  Negative for me.  Upper extremity reflexes are 2+ and symmetrical.  Lower extremity hyperreflexia.  Biceps triceps are strong.  Limitation internal rotation right shoulder with hand back to posterior x-ray line.  She can abduct and flex without pain.  No limitation of flexion and abduction.  Long head of the biceps is normal.  Median nerve in the forearm and wrist ulnar nerve at the elbow are normal to palpation.   Neurological: She is alert and oriented to person, place, and time.  Skin: Skin is warm and dry.  Psychiatric: She has a normal mood and affect.      Assessment/Plan C4-5 and C5-6 HNP/stenosis and retained hardware C6-7.  Neck pain and upper extremity radiculopathy  We will proceed with C4-5 and C5-6 ACDF and removal of hardware C6-7.  Surgery procedure along with potential rehab/recovery time discussed in detail.  All questions answered.  Wishes to proceed.  Benjiman Core, PA-C 04/02/2018, 12:04 PM

## 2018-04-02 NOTE — Progress Notes (Signed)
62 year old white female with history of C4-5 and C5-6 stenosis comes in for preoperative H&P.  Full H&P will be placed in patient's chart.  We received appropriate preoperative clearance.

## 2018-04-06 ENCOUNTER — Other Ambulatory Visit: Payer: Self-pay | Admitting: Family Medicine

## 2018-04-06 MED ORDER — HYDROCODONE-ACETAMINOPHEN 5-325 MG PO TABS: 1.0000 | ORAL_TABLET | Freq: Four times a day (QID) | ORAL | 0 refills | Status: DC | PRN

## 2018-04-06 NOTE — Telephone Encounter (Signed)
Ok to refill??  Last office visit 08/27/2017.  Last refill 02/04/2018.

## 2018-04-06 NOTE — Telephone Encounter (Signed)
Patient is calling to get refill on her hydrocodone  [facility] [place]  [phone]

## 2018-04-09 ENCOUNTER — Inpatient Hospital Stay (HOSPITAL_COMMUNITY): Admission: RE | Admit: 2018-04-09 | Payer: Medicare Other | Source: Ambulatory Visit

## 2018-04-09 NOTE — Pre-Procedure Instructions (Signed)
Kathy Barr  04/09/2018      [place], [place] - [id number] [place] [address] Phone: [phone] Fax: [phone]  [facility] for [place], [place] [address] Phone: [phone] Fax: [phone]    Your procedure is scheduled on May 17.  Report to [facility] Admitting at 530 A.M.  Call this number if you have problems the morning of surgery:  [phone]   Remember:  Do not eat food or drink liquids after midnight.  Take these medicines the morning of surgery with A SIP OF WATER   Albuterol inhaler if needed -bring with you on the day of surgery Cyclobenzaprine (Flexeril) if needed Hydrocodone (Norco) if needed Loratadine (Claritin)   Stop taking aspirin as directed by your Dr. Stop taking BC's, Goody's, Herbal medications, Fish oil, Aleve, Ibuprofen, Advil, motrin, Vitamins, Aleve5-7 days prior to your surgery   Do not wear jewelry, make-up or nail polish.  Do not wear lotions, powders, or perfumes, or deodorant.  Do not shave 48 hours prior to surgery.  Men may shave face and neck.  Do not bring valuables to the hospital.  [facility] is not responsible for any belongings or valuables.  Contacts, dentures or bridgework may not be worn into surgery.  Leave your suitcase in the car.  After surgery it may be brought to your room.  For patients admitted to the hospital, discharge time will be determined by your treatment team.  Patients discharged the day of surgery will not be allowed to drive home.   Special instructions:   [place]- Preparing For Surgery  Before surgery, you can play an important role. Because skin is not sterile, your skin needs to be as free of germs as possible. You can reduce the number of germs on your skin by washing with CHG (chlorahexidine gluconate) Soap before surgery.  CHG is an antiseptic cleaner  which kills germs and bonds with the skin to continue killing germs even after washing.  Please do not use if you have an allergy to CHG or antibacterial soaps. If your skin becomes reddened/irritated stop using the CHG.  Do not shave (including legs and underarms) for at least 48 hours prior to first CHG shower. It is OK to shave your face.  Please follow these instructions carefully.   1. Shower the NIGHT BEFORE SURGERY and the MORNING OF SURGERY with CHG.   2. If you chose to wash your hair, wash your hair first as usual with your normal shampoo.  3. After you shampoo, rinse your hair and body thoroughly to remove the shampoo.  4. Use CHG as you would any other liquid soap. You can apply CHG directly to the skin and wash gently with a scrungie or a clean washcloth.   5. Apply the CHG Soap to your body ONLY FROM THE NECK DOWN.  Do not use on open wounds or open sores. Avoid contact with your eyes, ears, mouth and genitals (private parts). Wash Face and genitals (private parts)  with your normal soap.  6. Wash thoroughly, paying special attention to the area where your surgery will be performed.  7. Thoroughly rinse your body with warm water from the neck down.  8. DO NOT shower/wash with your normal soap after using and rinsing off the CHG Soap.  9. Pat yourself dry with a CLEAN  TOWEL.  10. Wear CLEAN PAJAMAS to bed the night before surgery, wear comfortable clothes the morning of surgery  11. Place CLEAN SHEETS on your bed the night of your first shower and DO NOT SLEEP WITH PETS.    Day of Surgery: Do not apply any deodorants/lotions. Please wear clean clothes to the hospital/surgery center.      Please read over the following fact sheets that you were given. Pain Booklet, Coughing and Deep Breathing, MRSA Information and Surgical Site Infection Prevention

## 2018-04-10 ENCOUNTER — Other Ambulatory Visit (HOSPITAL_COMMUNITY): Payer: Medicare Other

## 2018-04-10 ENCOUNTER — Encounter (HOSPITAL_COMMUNITY)
Admission: RE | Admit: 2018-04-10 | Discharge: 2018-04-10 | Disposition: A | Discharge status: Home / Self Care | Payer: Medicare Other | Source: Ambulatory Visit | Attending: Orthopaedic Surgery | Admitting: Orthopaedic Surgery

## 2018-04-10 ENCOUNTER — Encounter (HOSPITAL_COMMUNITY): Payer: Self-pay

## 2018-04-10 ENCOUNTER — Other Ambulatory Visit: Payer: Self-pay

## 2018-04-10 DIAGNOSIS — Z01812 Encounter for preprocedural laboratory examination: Secondary | ICD-10-CM | POA: Insufficient documentation

## 2018-04-10 DIAGNOSIS — M4802 Spinal stenosis, cervical region: Secondary | ICD-10-CM | POA: Diagnosis not present

## 2018-04-10 DIAGNOSIS — M50221 Other cervical disc displacement at C4-C5 level: Secondary | ICD-10-CM | POA: Diagnosis not present

## 2018-04-10 DIAGNOSIS — Z96643 Presence of artificial hip joint, bilateral: Secondary | ICD-10-CM | POA: Insufficient documentation

## 2018-04-10 DIAGNOSIS — Z825 Family history of asthma and other chronic lower respiratory diseases: Secondary | ICD-10-CM | POA: Insufficient documentation

## 2018-04-10 DIAGNOSIS — K219 Gastro-esophageal reflux disease without esophagitis: Secondary | ICD-10-CM | POA: Diagnosis not present

## 2018-04-10 DIAGNOSIS — Z8249 Family history of ischemic heart disease and other diseases of the circulatory system: Secondary | ICD-10-CM | POA: Diagnosis not present

## 2018-04-10 DIAGNOSIS — Z833 Family history of diabetes mellitus: Secondary | ICD-10-CM | POA: Diagnosis not present

## 2018-04-10 DIAGNOSIS — F17210 Nicotine dependence, cigarettes, uncomplicated: Secondary | ICD-10-CM | POA: Insufficient documentation

## 2018-04-10 DIAGNOSIS — Z981 Arthrodesis status: Secondary | ICD-10-CM | POA: Diagnosis not present

## 2018-04-10 DIAGNOSIS — I10 Essential (primary) hypertension: Secondary | ICD-10-CM | POA: Insufficient documentation

## 2018-04-10 DIAGNOSIS — Z9049 Acquired absence of other specified parts of digestive tract: Secondary | ICD-10-CM | POA: Insufficient documentation

## 2018-04-10 LAB — SURGICAL PCR SCREEN
MRSA, PCR: NEGATIVE
Staphylococcus aureus: NEGATIVE

## 2018-04-10 LAB — BASIC METABOLIC PANEL
ANION GAP: 11 (ref 5–15)
BUN: 6 mg/dL (ref 6–20)
CO2: 26 mmol/L (ref 22–32)
Calcium: 9.4 mg/dL (ref 8.9–10.3)
Chloride: 100 mmol/L — ABNORMAL LOW (ref 101–111)
Creatinine, Ser: 0.73 mg/dL (ref 0.44–1.00)
GFR calc Af Amer: >60 mL/min (ref >60)
GFR calc non Af Amer: >60 mL/min (ref >60)
GLUCOSE: 103 mg/dL — AB (ref 65–99)
Potassium: 3.3 mmol/L — ABNORMAL LOW (ref 3.5–5.1)
Sodium: 137 mmol/L (ref 135–145)

## 2018-04-10 LAB — CBC
HEMATOCRIT: 40.8 % (ref 36.0–46.0)
Hemoglobin: 14.2 g/dL (ref 12.0–15.0)
MCH: 28.8 pg (ref 26.0–34.0)
MCHC: 34.8 g/dL (ref 30.0–36.0)
MCV: 82.8 fL (ref 78.0–100.0)
Platelets: 216 10*3/uL (ref 150–400)
RBC: 4.93 MIL/uL (ref 3.87–5.11)
RDW: 13.4 % (ref 11.5–15.5)
WBC: 8 10*3/uL (ref 4.0–10.5)

## 2018-04-10 NOTE — Progress Notes (Signed)
PCP - Dr. Lonell Grandchild Cardiologist - denies  EKG - 02/16/18  Aspirin Instructions: Pt to call Dr. Lorin Mercy regarding instructions on stopping ASA  Patient denies shortness of breath, fever, cough and chest pain at PAT appointment   Patient verbalized understanding of instructions that were given to them at the PAT appointment. Patient was also instructed that they will need to review over the PAT instructions again at home before surgery.

## 2018-04-23 ENCOUNTER — Inpatient Hospital Stay (INDEPENDENT_AMBULATORY_CARE_PROVIDER_SITE_OTHER): Payer: Medicare Other | Admitting: Orthopaedic Surgery

## 2018-04-24 ENCOUNTER — Other Ambulatory Visit: Payer: Self-pay

## 2018-04-24 ENCOUNTER — Inpatient Hospital Stay (HOSPITAL_COMMUNITY)
Admission: RE | Admit: 2018-04-24 | Discharge: 2018-04-25 | DRG: 473 | Disposition: A | Discharge status: Home / Self Care | Payer: Medicare Other | Source: Ambulatory Visit | Attending: Orthopaedic Surgery | Admitting: Orthopaedic Surgery

## 2018-04-24 ENCOUNTER — Encounter (HOSPITAL_COMMUNITY): Payer: Self-pay | Admitting: *Deleted

## 2018-04-24 ENCOUNTER — Encounter (HOSPITAL_COMMUNITY)
Admission: RE | Disposition: A | Discharge status: Home / Self Care | Payer: Self-pay | Source: Ambulatory Visit | Attending: Orthopaedic Surgery

## 2018-04-24 ENCOUNTER — Ambulatory Visit (HOSPITAL_COMMUNITY): Payer: Medicare Other

## 2018-04-24 ENCOUNTER — Ambulatory Visit (HOSPITAL_COMMUNITY): Payer: Medicare Other | Admitting: Anesthesiology

## 2018-04-24 DIAGNOSIS — M1990 Unspecified osteoarthritis, unspecified site: Secondary | ICD-10-CM | POA: Diagnosis present

## 2018-04-24 DIAGNOSIS — M4322 Fusion of spine, cervical region: Secondary | ICD-10-CM | POA: Diagnosis not present

## 2018-04-24 DIAGNOSIS — I10 Essential (primary) hypertension: Secondary | ICD-10-CM | POA: Diagnosis not present

## 2018-04-24 DIAGNOSIS — M4802 Spinal stenosis, cervical region: Principal | ICD-10-CM | POA: Diagnosis present

## 2018-04-24 DIAGNOSIS — M1612 Unilateral primary osteoarthritis, left hip: Secondary | ICD-10-CM | POA: Diagnosis present

## 2018-04-24 DIAGNOSIS — Z981 Arthrodesis status: Secondary | ICD-10-CM | POA: Diagnosis not present

## 2018-04-24 DIAGNOSIS — K219 Gastro-esophageal reflux disease without esophagitis: Secondary | ICD-10-CM | POA: Diagnosis not present

## 2018-04-24 DIAGNOSIS — M47812 Spondylosis without myelopathy or radiculopathy, cervical region: Secondary | ICD-10-CM | POA: Diagnosis not present

## 2018-04-24 DIAGNOSIS — Z419 Encounter for procedure for purposes other than remedying health state, unspecified: Secondary | ICD-10-CM

## 2018-04-24 DIAGNOSIS — K589 Irritable bowel syndrome without diarrhea: Secondary | ICD-10-CM | POA: Diagnosis not present

## 2018-04-24 DIAGNOSIS — M47896 Other spondylosis, lumbar region: Secondary | ICD-10-CM | POA: Diagnosis not present

## 2018-04-24 DIAGNOSIS — M4722 Other spondylosis with radiculopathy, cervical region: Secondary | ICD-10-CM | POA: Diagnosis not present

## 2018-04-24 DIAGNOSIS — Z96643 Presence of artificial hip joint, bilateral: Secondary | ICD-10-CM | POA: Diagnosis not present

## 2018-04-24 DIAGNOSIS — Z8249 Family history of ischemic heart disease and other diseases of the circulatory system: Secondary | ICD-10-CM

## 2018-04-24 DIAGNOSIS — Z87891 Personal history of nicotine dependence: Secondary | ICD-10-CM | POA: Diagnosis not present

## 2018-04-24 DIAGNOSIS — M542 Cervicalgia: Secondary | ICD-10-CM | POA: Diagnosis not present

## 2018-04-24 HISTORY — PX: ANTERIOR CERVICAL DECOMP/DISCECTOMY FUSION: SHX1161

## 2018-04-24 SURGERY — ANTERIOR CERVICAL DECOMPRESSION/DISCECTOMY FUSION 2 LEVELS
Anesthesia: General

## 2018-04-24 MED ORDER — PHENYLEPHRINE HCL 10 MG/ML IJ SOLN
INTRAMUSCULAR | Status: DC | PRN
  Administered 2018-04-24: 80 ug via INTRAVENOUS
  Administered 2018-04-24: 40 ug via INTRAVENOUS
  Administered 2018-04-24 (×3): 80 ug via INTRAVENOUS
  Administered 2018-04-24: 40 ug via INTRAVENOUS

## 2018-04-24 MED ORDER — OXYCODONE HCL 5 MG/5ML PO SOLN: 5.0000 mg | Freq: Once | ORAL | Status: DC | PRN

## 2018-04-24 MED ORDER — LORATADINE 10 MG PO TABS: 10.0000 mg | ORAL_TABLET | Freq: Every day | ORAL | Status: DC | PRN

## 2018-04-24 MED ORDER — CEFAZOLIN SODIUM-DEXTROSE 2-4 GM/100ML-% IV SOLN: 2.0000 g | INTRAVENOUS | Status: DC

## 2018-04-24 MED ORDER — MIDAZOLAM HCL 2 MG/2ML IJ SOLN
INTRAMUSCULAR | Status: AC
  Filled 2018-04-24: qty 2

## 2018-04-24 MED ORDER — SODIUM CHLORIDE 0.9 % IV SOLN: INTRAVENOUS | Status: DC

## 2018-04-24 MED ORDER — ONDANSETRON HCL 4 MG/2ML IJ SOLN
INTRAMUSCULAR | Status: AC
  Filled 2018-04-24: qty 4

## 2018-04-24 MED ORDER — ACETAMINOPHEN 650 MG RE SUPP: 650.0000 mg | RECTAL | Status: DC | PRN

## 2018-04-24 MED ORDER — SUGAMMADEX SODIUM 200 MG/2ML IV SOLN
INTRAVENOUS | Status: AC
  Filled 2018-04-24: qty 4

## 2018-04-24 MED ORDER — FENTANYL CITRATE (PF) 250 MCG/5ML IJ SOLN
INTRAMUSCULAR | Status: AC
  Filled 2018-04-24: qty 5

## 2018-04-24 MED ORDER — SODIUM CHLORIDE 0.9% FLUSH
3.0000 mL | Freq: Two times a day (BID) | INTRAVENOUS | Status: DC
  Administered 2018-04-24: 3 mL via INTRAVENOUS

## 2018-04-24 MED ORDER — BUPIVACAINE-EPINEPHRINE 0.25% -1:200000 IJ SOLN
INTRAMUSCULAR | Status: DC | PRN
Start: 2018-04-24 — End: 2018-04-24
  Administered 2018-04-24: 6 mL

## 2018-04-24 MED ORDER — CHLORHEXIDINE GLUCONATE 4 % EX LIQD: 60.0000 mL | Freq: Once | CUTANEOUS | Status: DC

## 2018-04-24 MED ORDER — PROPOFOL 10 MG/ML IV BOLUS
INTRAVENOUS | Status: DC | PRN
  Administered 2018-04-24: 140 mg via INTRAVENOUS

## 2018-04-24 MED ORDER — PROPOFOL 10 MG/ML IV BOLUS
INTRAVENOUS | Status: AC
  Filled 2018-04-24: qty 40

## 2018-04-24 MED ORDER — ONDANSETRON HCL 4 MG/2ML IJ SOLN
INTRAMUSCULAR | Status: DC | PRN
  Administered 2018-04-24: 4 mg via INTRAVENOUS

## 2018-04-24 MED ORDER — ONDANSETRON HCL 4 MG/2ML IJ SOLN: 4.0000 mg | Freq: Once | INTRAMUSCULAR | Status: DC | PRN

## 2018-04-24 MED ORDER — HYDROCODONE-ACETAMINOPHEN 7.5-325 MG PO TABS: 1.0000 | ORAL_TABLET | Freq: Four times a day (QID) | ORAL | 0 refills | Status: DC | PRN

## 2018-04-24 MED ORDER — ROCURONIUM BROMIDE 100 MG/10ML IV SOLN
INTRAVENOUS | Status: DC | PRN
  Administered 2018-04-24: 50 mg via INTRAVENOUS

## 2018-04-24 MED ORDER — BUPIVACAINE-EPINEPHRINE (PF) 0.25% -1:200000 IJ SOLN
INTRAMUSCULAR | Status: AC
  Filled 2018-04-24: qty 30

## 2018-04-24 MED ORDER — METHOCARBAMOL 1000 MG/10ML IJ SOLN
500.0000 mg | Freq: Four times a day (QID) | INTRAVENOUS | Status: DC | PRN
  Filled 2018-04-24: qty 5

## 2018-04-24 MED ORDER — ESMOLOL HCL 100 MG/10ML IV SOLN
INTRAVENOUS | Status: AC
  Filled 2018-04-24: qty 10

## 2018-04-24 MED ORDER — PHENYLEPHRINE 40 MCG/ML (10ML) SYRINGE FOR IV PUSH (FOR BLOOD PRESSURE SUPPORT)
PREFILLED_SYRINGE | INTRAVENOUS | Status: AC
  Filled 2018-04-24: qty 20

## 2018-04-24 MED ORDER — OXYCODONE HCL 5 MG PO TABS: 5.0000 mg | ORAL_TABLET | Freq: Once | ORAL | Status: DC | PRN

## 2018-04-24 MED ORDER — ALBUTEROL SULFATE (2.5 MG/3ML) 0.083% IN NEBU: 3.0000 mL | INHALATION_SOLUTION | Freq: Four times a day (QID) | RESPIRATORY_TRACT | Status: DC | PRN

## 2018-04-24 MED ORDER — SUCCINYLCHOLINE CHLORIDE 200 MG/10ML IV SOSY
PREFILLED_SYRINGE | INTRAVENOUS | Status: AC
  Filled 2018-04-24: qty 20

## 2018-04-24 MED ORDER — OXYCODONE HCL 5 MG PO TABS
5.0000 mg | ORAL_TABLET | Freq: Four times a day (QID) | ORAL | Status: DC | PRN
  Administered 2018-04-24 – 2018-04-25 (×5): 5 mg via ORAL
  Filled 2018-04-24 (×4): qty 1

## 2018-04-24 MED ORDER — DOCUSATE SODIUM 100 MG PO CAPS
100.0000 mg | ORAL_CAPSULE | Freq: Two times a day (BID) | ORAL | Status: DC
  Administered 2018-04-24 – 2018-04-25 (×3): 100 mg via ORAL
  Filled 2018-04-24 (×3): qty 1

## 2018-04-24 MED ORDER — DEXAMETHASONE SODIUM PHOSPHATE 10 MG/ML IJ SOLN
INTRAMUSCULAR | Status: AC
  Filled 2018-04-24: qty 2

## 2018-04-24 MED ORDER — DEXTROSE 5 % IV SOLN
INTRAVENOUS | Status: DC | PRN
  Administered 2018-04-24: 15 ug/min via INTRAVENOUS

## 2018-04-24 MED ORDER — LACTATED RINGERS IV SOLN
INTRAVENOUS | Status: DC | PRN
  Administered 2018-04-24 (×2): via INTRAVENOUS

## 2018-04-24 MED ORDER — POLYETHYLENE GLYCOL 3350 17 G PO PACK: 17.0000 g | PACK | Freq: Every day | ORAL | Status: DC

## 2018-04-24 MED ORDER — CEFAZOLIN SODIUM-DEXTROSE 2-4 GM/100ML-% IV SOLN
INTRAVENOUS | Status: AC
  Filled 2018-04-24: qty 100

## 2018-04-24 MED ORDER — SODIUM CHLORIDE 0.9% FLUSH: 3.0000 mL | INTRAVENOUS | Status: DC | PRN

## 2018-04-24 MED ORDER — FENTANYL CITRATE (PF) 100 MCG/2ML IJ SOLN
INTRAMUSCULAR | Status: DC | PRN
  Administered 2018-04-24: 50 ug via INTRAVENOUS
  Administered 2018-04-24: 100 ug via INTRAVENOUS
  Administered 2018-04-24: 25 ug via INTRAVENOUS
  Administered 2018-04-24: 50 ug via INTRAVENOUS
  Administered 2018-04-24: 25 ug via INTRAVENOUS

## 2018-04-24 MED ORDER — DEXAMETHASONE SODIUM PHOSPHATE 10 MG/ML IJ SOLN
INTRAMUSCULAR | Status: DC | PRN
  Administered 2018-04-24: 10 mg via INTRAVENOUS

## 2018-04-24 MED ORDER — MIDAZOLAM HCL 5 MG/5ML IJ SOLN
INTRAMUSCULAR | Status: DC | PRN
  Administered 2018-04-24: 2 mg via INTRAVENOUS

## 2018-04-24 MED ORDER — OXYCODONE HCL 5 MG PO TABS
ORAL_TABLET | ORAL | Status: AC
  Filled 2018-04-24: qty 1

## 2018-04-24 MED ORDER — SUGAMMADEX SODIUM 200 MG/2ML IV SOLN
INTRAVENOUS | Status: DC | PRN
  Administered 2018-04-24: 150 mg via INTRAVENOUS

## 2018-04-24 MED ORDER — MENTHOL 3 MG MT LOZG: 1.0000 | LOZENGE | OROMUCOSAL | Status: DC | PRN

## 2018-04-24 MED ORDER — ACETAMINOPHEN 325 MG PO TABS
650.0000 mg | ORAL_TABLET | ORAL | Status: DC | PRN
  Administered 2018-04-25: 650 mg via ORAL
  Filled 2018-04-24: qty 2

## 2018-04-24 MED ORDER — METHOCARBAMOL 500 MG PO TABS
500.0000 mg | ORAL_TABLET | Freq: Four times a day (QID) | ORAL | Status: DC | PRN
  Administered 2018-04-24 – 2018-04-25 (×3): 500 mg via ORAL
  Filled 2018-04-24 (×3): qty 1

## 2018-04-24 MED ORDER — LIDOCAINE HCL (CARDIAC) PF 100 MG/5ML IV SOSY
PREFILLED_SYRINGE | INTRAVENOUS | Status: DC | PRN
  Administered 2018-04-24: 40 mg via INTRAVENOUS

## 2018-04-24 MED ORDER — SODIUM CHLORIDE 0.9 % IV SOLN: 250.0000 mL | INTRAVENOUS | Status: DC

## 2018-04-24 MED ORDER — FENTANYL CITRATE (PF) 100 MCG/2ML IJ SOLN: 25.0000 ug | INTRAMUSCULAR | Status: DC | PRN

## 2018-04-24 MED ORDER — CEFAZOLIN SODIUM-DEXTROSE 1-4 GM/50ML-% IV SOLN
1.0000 g | Freq: Three times a day (TID) | INTRAVENOUS | Status: AC
  Administered 2018-04-24 (×2): 1 g via INTRAVENOUS
  Filled 2018-04-24 (×2): qty 50

## 2018-04-24 MED ORDER — ONDANSETRON HCL 4 MG PO TABS: 4.0000 mg | ORAL_TABLET | Freq: Four times a day (QID) | ORAL | Status: DC | PRN

## 2018-04-24 MED ORDER — PHENOL 1.4 % MT LIQD: 1.0000 | OROMUCOSAL | Status: DC | PRN

## 2018-04-24 MED ORDER — ONDANSETRON HCL 4 MG/2ML IJ SOLN: 4.0000 mg | Freq: Four times a day (QID) | INTRAMUSCULAR | Status: DC | PRN

## 2018-04-24 MED ORDER — 0.9 % SODIUM CHLORIDE (POUR BTL) OPTIME
TOPICAL | Status: DC | PRN
  Administered 2018-04-24: 1000 mL

## 2018-04-24 MED ORDER — HYDROCHLOROTHIAZIDE 25 MG PO TABS
25.0000 mg | ORAL_TABLET | Freq: Every day | ORAL | Status: DC
  Administered 2018-04-25: 25 mg via ORAL
  Filled 2018-04-24: qty 1

## 2018-04-24 SURGICAL SUPPLY — 65 items
APL SKNCLS STERI-STRIP NONHPOA (GAUZE/BANDAGES/DRESSINGS) ×1
APL SRG 60D 8 XTD TIP BNDBL (TIP) ×1
BENZOIN TINCTURE PRP APPL 2/3 (GAUZE/BANDAGES/DRESSINGS) ×3 IMPLANT
BIT DRILL SKYLINE 12MM (BIT) IMPLANT
BLADE CLIPPER SURG (BLADE) IMPLANT
BONE CERV LORDOTIC 14.5X12X10 (Bone Implant) ×3 IMPLANT
BONE CERV LORDOTIC 14.5X12X6 (Bone Implant) ×3 IMPLANT
BUR ROUND FLUTED 4 SOFT TCH (BURR) IMPLANT
BUR ROUND FLUTED 4MM SOFT TCH (BURR)
CLOSURE WOUND 1/2 X4 (GAUZE/BANDAGES/DRESSINGS) ×1
COLLAR CERV LO CONTOUR FIRM DE (SOFTGOODS) ×2 IMPLANT
CORD BIPOLAR FORCEPS 12FT (ELECTRODE) ×3 IMPLANT
COVER SURGICAL LIGHT HANDLE (MISCELLANEOUS) ×3 IMPLANT
CRADLE DONUT ADULT HEAD (MISCELLANEOUS) ×3 IMPLANT
DRAPE C-ARM 42X72 X-RAY (DRAPES) ×3 IMPLANT
DRAPE HALF SHEET 40X57 (DRAPES) ×3 IMPLANT
DRAPE MICROSCOPE LEICA (MISCELLANEOUS) ×3 IMPLANT
DRILL BIT SKYLINE 12MM (BIT) ×3
DURAPREP 6ML APPLICATOR 50/CS (WOUND CARE) ×3 IMPLANT
DURASEAL APPLICATOR TIP (TIP) ×2 IMPLANT
DURASEAL SPINE SEALANT 3ML (MISCELLANEOUS) ×2 IMPLANT
ELECT COATED BLADE 2.86 ST (ELECTRODE) ×3 IMPLANT
ELECT REM PT RETURN 9FT ADLT (ELECTROSURGICAL) ×3
ELECTRODE REM PT RTRN 9FT ADLT (ELECTROSURGICAL) ×1 IMPLANT
EVACUATOR 1/8 PVC DRAIN (DRAIN) ×5 IMPLANT
GAUZE SPONGE 4X4 12PLY STRL (GAUZE/BANDAGES/DRESSINGS) ×3 IMPLANT
GAUZE SPONGE 4X4 12PLY STRL LF (GAUZE/BANDAGES/DRESSINGS) ×2 IMPLANT
GLOVE BIOGEL PI IND STRL 8 (GLOVE) ×2 IMPLANT
GLOVE BIOGEL PI INDICATOR 8 (GLOVE) ×4
GLOVE ORTHO TXT STRL SZ7.5 (GLOVE) ×6 IMPLANT
GOWN STRL REUS W/ TWL LRG LVL3 (GOWN DISPOSABLE) ×1 IMPLANT
GOWN STRL REUS W/ TWL XL LVL3 (GOWN DISPOSABLE) ×1 IMPLANT
GOWN STRL REUS W/TWL 2XL LVL3 (GOWN DISPOSABLE) ×3 IMPLANT
GOWN STRL REUS W/TWL LRG LVL3 (GOWN DISPOSABLE) ×3
GOWN STRL REUS W/TWL XL LVL3 (GOWN DISPOSABLE) ×3
GRAFT BNE SPCR VG2 14.5X12X10 (Bone Implant) IMPLANT
GRAFT BNE SPCR VG2 14.5X12X6 (Bone Implant) IMPLANT
HEAD HALTER (SOFTGOODS) ×3 IMPLANT
HEMOSTAT SURGICEL 2X14 (HEMOSTASIS) IMPLANT
KIT BASIN OR (CUSTOM PROCEDURE TRAY) ×3 IMPLANT
KIT TURNOVER KIT B (KITS) ×3 IMPLANT
MANIFOLD NEPTUNE II (INSTRUMENTS) IMPLANT
NDL 25GX 5/8IN NON SAFETY (NEEDLE) ×1 IMPLANT
NEEDLE 25GX 5/8IN NON SAFETY (NEEDLE) ×3 IMPLANT
NS IRRIG 1000ML POUR BTL (IV SOLUTION) ×3 IMPLANT
PACK ORTHO CERVICAL (CUSTOM PROCEDURE TRAY) ×3 IMPLANT
PAD ARMBOARD 7.5X6 YLW CONV (MISCELLANEOUS) ×6 IMPLANT
PATTIES SURGICAL .5 X.5 (GAUZE/BANDAGES/DRESSINGS) ×2 IMPLANT
PIN TEMP SKYLINE THREADED (PIN) ×2 IMPLANT
PLATE TWO LEVEL SKYLINE 30MM (Plate) ×2 IMPLANT
RESTRAINT LIMB HOLDER UNIV (RESTRAINTS) IMPLANT
SCREW VARIABLE SELF TAP 12MM (Screw) ×12 IMPLANT
STRIP CLOSURE SKIN 1/2X4 (GAUZE/BANDAGES/DRESSINGS) ×2 IMPLANT
SURGIFLO W/THROMBIN 8M KIT (HEMOSTASIS) IMPLANT
SUT BONE WAX W31G (SUTURE) ×3 IMPLANT
SUT PROLENE 6 0 C 1 24 (SUTURE) ×2 IMPLANT
SUT SILK 3 0 (SUTURE) ×3
SUT SILK 3-0 18XBRD TIE 12 (SUTURE) IMPLANT
SUT VIC AB 3-0 X1 27 (SUTURE) ×3 IMPLANT
SUT VICRYL 4-0 PS2 18IN ABS (SUTURE) ×6 IMPLANT
SYR CONTROL 10ML LL (SYRINGE) ×2 IMPLANT
TAPE CLOTH SURG 4X10 WHT LF (GAUZE/BANDAGES/DRESSINGS) ×2 IMPLANT
TOWEL OR 17X24 6PK STRL BLUE (TOWEL DISPOSABLE) ×3 IMPLANT
TOWEL OR 17X26 10 PK STRL BLUE (TOWEL DISPOSABLE) ×3 IMPLANT
TRAY FOLEY CATH SILVER 16FR (SET/KITS/TRAYS/PACK) IMPLANT

## 2018-04-24 NOTE — Op Note (Signed)
Preop diagnosis: Old C6-7 solid cervical fusion with plate.  Cervical spondylosis with anterolisthesis C4-5, C5-6.  Postop diagnosis: Same  Procedure: Removal of anterior C6-7 cervical plate.  C4-5, C5-6 anterior cervical discectomy and fusion, allograft and plate.  Repair 1 mm Intra-Op dural tear.  Surgeon: Rodell Perna, MD  Assistant: Benjiman Core, PA-C medically necessary and present for the entire procedure  Anesthesia: General oral tracheal +6 cc Marcaine skin local  Drains: One Hemovac neck  Brief history: 62 year old female with previous cervical fusion at C6-7 15 years ago with solid fusion.  He is developed progressive spondylosis at C4-5 and C5-6 with anterolisthesis severe facet arthropathy and right radicular symptoms with severe foraminal stenosis.  Preoperative informed consent was obtained prior to the procedure.  Procedure: After induction of general anesthesia orotracheal intubation preoperative antibiotics timeout procedure head halter traction without weights arms tucked at the sides with yellow pads over the ulnar nerve neck was prepped with DuraPrep the old incision was outlined with a purple skin marker area squared with towels Betadine Steri-Drape applied sterile female standard the head thyroid sheet and drapes applied C arm was sterilely draped as well as operative microscope.  Old incision was opened extended 1 cm laterally.  Blunt dissection below the omohyoid was performed and the L plate was identified after needle was placed at the 4 5 interspace above the omohyoid bone confirmed with lateral C arm.  Plate had some bone overgrowth that had to be chipped away with a small hand osteotome to expose the screws on the plate.  Fusion was solid and this was a Synthes plate and the tiny screwdriver was removed followed by the larger screwdriver to remove all 4 screws and then removal of the plate with a Coker clamp.  Fusion was visualized tested and was solid consistent with  preoperative imaging.  There was overhanging spurs at C5-6 that were removed and sent C5-6 was the most severely tight level it was surgically addressed first.  Using Claritin curettes pituitary 1 and 2 mm Kerrisons spurs were removed uncovertebral joints were stripped right and left with a Cloward curettes.  Bur was used for millimeter progressing back to the posterior endplate.  There is overhanging spurs and using the bur on the left side thinning the posterior osteophyte osteophyte broke off and on 1 mm dural tear occurred.  Patty was placed in additional 3 to 4 mm of bone was taken off of C5 for exposure using trial sizers a 10 mm graft would fit in this area.  Spurs were removed again using operative microscope for exposure and a 6-0 Prolene was used to repair the 1 mm dural tear.  Dura was thin and after single suture was placed some DuraSeal was placed over the top a thin layer and after several minutes patient was Valsalva but by the CRNA 30 cm pressure with no CSF leak.  Continue preparation of the endplates to make sure there was nice square endplate for fitting the graft was performed trial sizers and 10 mm cortical cancellous [place] 10 mm tall graft was placed with CRNA pulling some traction on the head halter traction concerning the graft countersinking at 1 mm.  Care was taken to make sure bone graft is in appropriate position it was tight there was room on each side for egressive fluid and repeat Valsalva showed no CSF leak.  Graft had been marked with a purple marker in the midline to make sure he did not rotate and was exactly  in the midline.  C-arm was used for confirmation.  Identical procedure was repeated at the C4-5 level.  This level had some spondylolisthesis of 2 to 3 mm and less spurring at C5-6 level.  Again spurs removed more on the right than the left side where she was tighter.  Posterior longitudinal ligament was partially taken down decompression of the dura trial sizer  showed a 6 mm graft gave good fit.  There is still room for the upper portion of C5 after the partial corpectomy had been performed for exposure for the dura to put a single suture and at the C5-6 level for screws to still fit at the upper portion of C5.  A 30 mm skyline Depuy plate was selected and 12 mm screws x6 were placed after confirmation was C arm the plate was in good position.  Once screws were placed it was rechecked by C arm and then all 6 screws were locked down with a tiny screwdriver.  C arm documented good position of the grafts.  Repeat Valsalva showed no CSF leak operative field was dry repeat irrigation Hemovac drain placed within and out technique on the left side in line with the skin incision.  Reapproximation of the platysma with 3-0 Vicryl and then 4-0 Vicryl subcuticular closure.  Tincture of benzoin Steri-Strips Marcaine infiltration of the skin postop dressing soft collar was applied.  Patient tolerated procedure well was neurologically intact in the recovery room had good relief of right arm pain and no headache.

## 2018-04-24 NOTE — Anesthesia Preprocedure Evaluation (Signed)
Anesthesia Evaluation  Patient identified by MRN, date of birth, ID band Patient awake    Reviewed: Allergy & Precautions, NPO status , Patient's Chart, lab work & pertinent test results  Airway Mallampati: II  TM Distance: >3 FB Neck ROM: Full    Dental  (+) Edentulous Upper, Edentulous Lower   Pulmonary former smoker,    + rhonchi  + decreased breath sounds      Cardiovascular hypertension,  Rhythm:Regular Rate:Normal     Neuro/Psych    GI/Hepatic   Endo/Other    Renal/GU      Musculoskeletal   Abdominal   Peds  Hematology   Anesthesia Other Findings   Reproductive/Obstetrics                             Anesthesia Physical Anesthesia Plan  ASA: III  Anesthesia Plan: General   Post-op Pain Management:    Induction: Intravenous  PONV Risk Score and Plan: Ondansetron and Dexamethasone  Airway Management Planned: Oral ETT  Additional Equipment:   Intra-op Plan:   Post-operative Plan: Extubation in OR  Informed Consent: I have reviewed the patients History and Physical, chart, labs and discussed the procedure including the risks, benefits and alternatives for the proposed anesthesia with the patient or authorized representative who has indicated his/her understanding and acceptance.     Plan Discussed with: CRNA and Anesthesiologist  Anesthesia Plan Comments:         Anesthesia Quick Evaluation

## 2018-04-24 NOTE — Progress Notes (Signed)
Orthopedic Tech Progress Note Patient Details:  DAANA PETRASEK 29-Apr-1956 [id number]  Ortho Devices Type of Ortho Device: Soft collar Ortho Device/Splint Interventions: Loanne Drilling, Heba Ige 04/24/2018, 2:07 PM

## 2018-04-24 NOTE — Interval H&P Note (Signed)
History and Physical Interval Note:  04/24/2018 7:19 AM  Kathy Barr  has presented today for surgery, with the diagnosis of C4-5, C5-6 FORAMINAL STENOSIS, LISTHESIS  The various methods of treatment have been discussed with the patient and family. After consideration of risks, benefits and other options for treatment, the patient has consented to  Procedure(s): C6-7,PLATE REMOVAL, E0-2, C5-6 ANTERIOR CERVICAL DECOMPRESSION/DISCECTOMY & FUSION, ALLOGRAFT, PLATE (N/A) as a surgical intervention .  The patient's history has been reviewed, patient examined, no change in status, stable for surgery.  I have reviewed the patient's chart and labs.  Questions were answered to the patient's satisfaction.   Discussed with pt and daughter possible zero P inplant at one level if there is not enough exposure for all 3 level. She understands and agrees to proceed. Old incision may be directly over C6-7 which may be to far away for access to C4-5. ?'s answered .   Kathy Barr

## 2018-04-24 NOTE — Anesthesia Procedure Notes (Signed)
Procedure Name: Intubation Date/Time: 04/24/2018 7:45 AM Performed by: Shirlyn Goltz, CRNA Pre-anesthesia Checklist: Patient identified, Emergency Drugs available, Suction available and Patient being monitored Patient Re-evaluated:Patient Re-evaluated prior to induction Oxygen Delivery Method: Circle system utilized Preoxygenation: Pre-oxygenation with 100% oxygen Induction Type: IV induction Ventilation: Mask ventilation without difficulty and Oral airway inserted - appropriate to patient size Laryngoscope Size: Mac and 3 Grade View: Grade I Tube type: Oral Tube size: 7.0 mm Number of attempts: 1 Airway Equipment and Method: Stylet Placement Confirmation: ETT inserted through vocal cords under direct vision,  positive ETCO2 and breath sounds checked- equal and bilateral Secured at: 20 cm Tube secured with: Tape Dental Injury: Teeth and Oropharynx as per pre-operative assessment

## 2018-04-24 NOTE — Plan of Care (Signed)

## 2018-04-24 NOTE — Anesthesia Postprocedure Evaluation (Signed)
Anesthesia Post Note  Patient: KIARI HOSMER  Procedure(s) Performed: C6-7 PLATE REMOVAL, O2-7, C5-6 ANTERIOR CERVICAL DECOMPRESSION/DISCECTOMY & FUSION, ALLOGRAFT, PLATE (N/A )     Patient location during evaluation: PACU Anesthesia Type: General Level of consciousness: awake and alert Pain management: pain level controlled Vital Signs Assessment: post-procedure vital signs reviewed and stable Respiratory status: spontaneous breathing, nonlabored ventilation, respiratory function stable and patient connected to nasal cannula oxygen Cardiovascular status: blood pressure returned to baseline and stable Postop Assessment: no apparent nausea or vomiting Anesthetic complications: no    Last Vitals:  Vitals:   04/24/18 1332 04/24/18 1354  BP: (!) 142/85 (!) 107/93  Pulse:  (!) 102  Resp: 17 16  Temp:  36.7 C  SpO2:  97%    Last Pain:  Vitals:   04/24/18 1354  TempSrc: Oral  PainSc:                  Josalin Carneiro COKER

## 2018-04-24 NOTE — Progress Notes (Signed)
Patient more alert in recovery room.  Procedure discussed with patient she remains neurologically intact good relief of preop arm pain.  Dural tear was discussed with patient with single suture placed with watertight seal.  This was discussed with patient's daughter and also patient's boyfriend.  Patient will stay in her soft collar have dressing change in a.m. and can be discharged home.

## 2018-04-24 NOTE — Transfer of Care (Signed)
Immediate Anesthesia Transfer of Care Note  Patient: Kathy Barr  Procedure(s) Performed: C6-7 PLATE REMOVAL, S2-3, C5-6 ANTERIOR CERVICAL DECOMPRESSION/DISCECTOMY & FUSION, ALLOGRAFT, PLATE (N/A )  Patient Location: PACU  Anesthesia Type:General  Level of Consciousness: awake, alert , oriented and patient cooperative  Airway & Oxygen Therapy: Patient Spontanous Breathing and Patient connected to nasal cannula oxygen  Post-op Assessment: Report given to RN and Post -op Vital signs reviewed and stable  Post vital signs: Reviewed and stable  Last Vitals:  Vitals Value Taken Time  BP 153/78 04/24/2018 10:56 AM  Temp    Pulse 110 04/24/2018 10:57 AM  Resp 17 04/24/2018 10:57 AM  SpO2 93 % 04/24/2018 10:57 AM  Vitals shown include unvalidated device data.  Last Pain:  Vitals:   04/24/18 0606  TempSrc:   PainSc: 0-No pain      Patients Stated Pain Goal: 3 (95/32/02 3343)  Complications: No apparent anesthesia complications

## 2018-04-24 NOTE — Progress Notes (Signed)
Patient ID: Kathy Barr, female   DOB: 11-25-1956, 62 y.o.   MRN: [id number] Neuro intact in pacu. Triceps , biceps , WF, WE, grip all 5/5. Good relief of pre-op right arm pain. No HA. LE strong. She will keep collar on at all times  . No shower collar. Plan discharge in AM and ROV one week to see Dr. Lorin Mercy

## 2018-04-25 NOTE — Progress Notes (Signed)
Patient is discharged from room 3C06 at this time. Alert and in stable condition. IV site d/c'd and instructions read to patient and family with understanding verbalized. Left unit via wheelchair with all belongings at side.  

## 2018-04-25 NOTE — Progress Notes (Signed)
Pt stable Pain ok Has been walking in hall Moves all extremities well Plan dc hv and dc home

## 2018-04-27 ENCOUNTER — Telehealth (INDEPENDENT_AMBULATORY_CARE_PROVIDER_SITE_OTHER): Payer: Self-pay | Admitting: Orthopaedic Surgery

## 2018-04-27 NOTE — Telephone Encounter (Signed)
Patient had surgery LAST Friday, 04/24/18.  IC and LM that if she still has questions to call me.

## 2018-04-27 NOTE — Telephone Encounter (Signed)
Pt is sched for surgery this Friday  would like to know if she can take blood pressure med after surgery

## 2018-04-29 ENCOUNTER — Encounter (HOSPITAL_COMMUNITY): Payer: Self-pay | Admitting: Orthopaedic Surgery

## 2018-04-30 ENCOUNTER — Ambulatory Visit (INDEPENDENT_AMBULATORY_CARE_PROVIDER_SITE_OTHER): Payer: Medicare Other | Admitting: Orthopaedic Surgery

## 2018-04-30 ENCOUNTER — Encounter (INDEPENDENT_AMBULATORY_CARE_PROVIDER_SITE_OTHER): Payer: Self-pay | Admitting: Orthopaedic Surgery

## 2018-04-30 ENCOUNTER — Ambulatory Visit (INDEPENDENT_AMBULATORY_CARE_PROVIDER_SITE_OTHER): Payer: Medicare Other

## 2018-04-30 VITALS — BP 139/93 | HR 112 | Ht 64.0 in | Wt 189.0 lb

## 2018-04-30 DIAGNOSIS — M4322 Fusion of spine, cervical region: Secondary | ICD-10-CM

## 2018-04-30 NOTE — Progress Notes (Signed)
Post-Op Visit Note   Patient: Kathy Barr           Date of Birth: [birth year]           MRN: [id number] Visit Date: 04/30/2018 PCP: Alycia Rossetti, MD   Assessment & Plan: Post cervical fusion.  She had 1 mm dural tear repaired.  Good relief of arm pain.  She is ambulatory.  She is had some problems with constipation and nausea problems eating related to taking too much pain medication.  She will stop the pain medicine just use Tylenol and return in 5 weeks.  Chief Complaint:  Chief Complaint  Patient presents with  . Neck - Routine Post Op   Visit Diagnoses:  1. Fusion of spine of cervical region     Plan: Patient will stop the pain medication she is had some problems with nausea associated with the medicine as well as problems eating.  Incision looks good no swelling in the neck she is got good relief of her arm pain.  She has had some associated headaches.  She will stay in the recliner for rash she has an extra collar for showering I will recheck her again in 5 weeks.  Lateral flexion-extension x-ray on return.  Follow-Up Instructions: No follow-ups on file.   Orders:  Orders Placed This Encounter  Procedures  . XR Cervical Spine 2 or 3 views   No orders of the defined types were placed in this encounter.   Imaging: Xr Cervical Spine 2 Or 3 Views  Result Date: 04/30/2018 [facility] lateral C-spine x-rays demonstrate C4-5 C5-6 anterior cervical discectomy and fusion with removal of previous C6-7 plate.  Good position of plate screws and graft. Impression: Satisfactory postop fusion C4-C6 with plate and screws.   PMFS History: Patient Active Problem List   Diagnosis Date Noted  . Cervical spinal stenosis 04/24/2018  . Cervical spondylosis 04/24/2018  . Other spondylosis with radiculopathy, cervical region 03/05/2018  . Hypertriglyceridemia 08/27/2017  . Vertigo 02/04/2017  . Tobacco use 02/04/2017  . Frozen shoulder 10/24/2015  . Atrophic vulvovaginitis 06/05/2015  .  DDD (degenerative disc disease), lumbar 01/13/2015  . IBS (irritable bowel syndrome) 07/12/2014  . Bowel habit changes 01/12/2014  . Osteoarthritis of right hip 03/16/2013    Class: Chronic  . Generalized OA 10/29/2012  . Essential hypertension, benign 10/29/2012  . Obesity 10/29/2012  . Chronic neck pain 10/29/2012   Past Medical History:  Diagnosis Date  . Arthritis    "all over" (03/18/2013)  . Carpal tunnel syndrome    "right" (03/18/2013)  . Fracture 2013   RLE  . GERD (gastroesophageal reflux disease)    OTC  . Hypertension   . IBS (irritable bowel syndrome)   . Osteoarthritis of left hip   . Seizures ([place]) 1960's; 1978   "when I was little; when I was 7 months pregnant" (03/18/2013)  . Shortness of breath    "not since I quit smoking" (03/18/2013)    Family History  Problem Relation Age of Onset  . Hypertension Father   . Diabetes Father   . Hypertension Sister   . Diabetes Sister   . Hypertension Brother   . Diabetes Brother   . Diabetes Mother   . Hypertension Mother   . Hyperlipidemia Mother   . COPD Brother   . Heart disease Brother   . Hypertension Brother     Past Surgical History:  Procedure Laterality Date  . ANTERIOR CERVICAL DECOMP/DISCECTOMY FUSION  ?2003  .  ANTERIOR CERVICAL DECOMP/DISCECTOMY FUSION N/A 04/24/2018   Procedure: C6-7 PLATE REMOVAL, B8-6, C5-6 ANTERIOR CERVICAL DECOMPRESSION/DISCECTOMY & FUSION, ALLOGRAFT, PLATE;  Surgeon: Marybelle Killings, MD;  Location: [facility];  Service: Orthopedics;  Laterality: N/A;  . CHOLECYSTECTOMY  2003  . COLONOSCOPY N/A 02/17/2014   Procedure: COLONOSCOPY;  Surgeon: Rogene Houston, MD;  [facility];  Service: Endoscopy;  Laterality: N/A;  145-moved to 10:30 Ann to notify pt  . FOOT ARTHROTOMY Right 1990's   pin, after removing a bone   . JOINT REPLACEMENT Left    hip and right  . pinched nerve    . TOTAL HIP ARTHROPLASTY  06/30/2012   Procedure: TOTAL HIP ARTHROPLASTY;  Surgeon: Jessy Oto, MD;   Location: [facility];  Service: Orthopedics;  Laterality: Left;  Left total hip replacement with metal and polypropylene pore coated implants  . TOTAL HIP ARTHROPLASTY Right 03/16/2013   Procedure: RIGHT TOTAL HIP ARTHROPLASTY- right ;  Surgeon: Jessy Oto, MD;  Location: [facility];  Service: Orthopedics;  Laterality: Right;  . TUBAL LIGATION  1978  . VAGINAL HYSTERECTOMY  ~ 2003   Social History   Occupational History  . Not on file  Tobacco Use  . Smoking status: Former Smoker    Packs/day: 0.25    Years: 25.00    Pack years: 6.25    Types: Cigarettes  . Smokeless tobacco: Never Used  . Tobacco comment: Quit 3 months ago  Substance and Sexual Activity  . Alcohol use: No    Alcohol/week: 0.0 oz  . Drug use: No  . Sexual activity: Yes

## 2018-05-01 ENCOUNTER — Telehealth (INDEPENDENT_AMBULATORY_CARE_PROVIDER_SITE_OTHER): Payer: Self-pay | Admitting: Orthopaedic Surgery

## 2018-05-01 NOTE — Telephone Encounter (Signed)
Patient called asked if she can drive and if there is any restrictions. Patient  Asked  When can she lay in the bed and get off of the recliner? The number to contact patient is [phone] or [phone]

## 2018-05-01 NOTE — Telephone Encounter (Signed)
I left voicemail for patient advising. She is not able to drive until after follow up in the office. She is in a cervical collar that she should not be removing, and this can be considered driving with an impairment. I also advised per his note yesterday, she is to continue in the recliner. Per yesterday's visit, she has not been feeling well and unable to rest. I did ask for her to return my call if she has further questions.

## 2018-05-02 ENCOUNTER — Emergency Department (HOSPITAL_COMMUNITY)
Admission: EM | Admit: 2018-05-02 | Discharge: 2018-05-03 | Disposition: A | Discharge status: Home / Self Care | Payer: Medicare Other | Attending: Emergency Medicine | Admitting: Emergency Medicine

## 2018-05-02 ENCOUNTER — Other Ambulatory Visit: Payer: Self-pay

## 2018-05-02 ENCOUNTER — Encounter (HOSPITAL_COMMUNITY): Payer: Self-pay | Admitting: Emergency Medicine

## 2018-05-02 DIAGNOSIS — R519 Headache, unspecified: Secondary | ICD-10-CM

## 2018-05-02 DIAGNOSIS — Z87891 Personal history of nicotine dependence: Secondary | ICD-10-CM | POA: Diagnosis not present

## 2018-05-02 DIAGNOSIS — I10 Essential (primary) hypertension: Secondary | ICD-10-CM | POA: Insufficient documentation

## 2018-05-02 DIAGNOSIS — Z96643 Presence of artificial hip joint, bilateral: Secondary | ICD-10-CM | POA: Diagnosis not present

## 2018-05-02 DIAGNOSIS — Z79899 Other long term (current) drug therapy: Secondary | ICD-10-CM | POA: Diagnosis not present

## 2018-05-02 DIAGNOSIS — G919 Hydrocephalus, unspecified: Secondary | ICD-10-CM | POA: Diagnosis not present

## 2018-05-02 DIAGNOSIS — G4489 Other headache syndrome: Secondary | ICD-10-CM | POA: Diagnosis not present

## 2018-05-02 DIAGNOSIS — R51 Headache: Secondary | ICD-10-CM | POA: Insufficient documentation

## 2018-05-02 DIAGNOSIS — R112 Nausea with vomiting, unspecified: Secondary | ICD-10-CM | POA: Diagnosis not present

## 2018-05-02 NOTE — ED Notes (Signed)
Pt called, not in waiting room

## 2018-05-02 NOTE — ED Triage Notes (Signed)
Pt brought in by [facility] for headache after having rods replaced in neck 04/24/18, all v/s WNL, pt reports taking tylenol with no relief, pt reports she was given "Hydrocodone 7.5/325" for post surgical pain but she has taken them all

## 2018-05-03 ENCOUNTER — Emergency Department (HOSPITAL_COMMUNITY): Payer: Medicare Other

## 2018-05-03 DIAGNOSIS — R51 Headache: Secondary | ICD-10-CM | POA: Diagnosis not present

## 2018-05-03 MED ORDER — SODIUM CHLORIDE 0.9 % IV BOLUS
1000.0000 mL | Freq: Once | INTRAVENOUS | Status: AC
  Administered 2018-05-03: 1000 mL via INTRAVENOUS

## 2018-05-03 MED ORDER — ONDANSETRON HCL 4 MG/2ML IJ SOLN
4.0000 mg | Freq: Once | INTRAMUSCULAR | Status: AC
  Administered 2018-05-03: 4 mg via INTRAVENOUS
  Filled 2018-05-03: qty 2

## 2018-05-03 MED ORDER — DIPHENHYDRAMINE HCL 50 MG/ML IJ SOLN
12.5000 mg | Freq: Once | INTRAMUSCULAR | Status: AC
  Administered 2018-05-03: 12.5 mg via INTRAVENOUS
  Filled 2018-05-03: qty 1

## 2018-05-03 MED ORDER — METOCLOPRAMIDE HCL 5 MG/ML IJ SOLN
10.0000 mg | Freq: Once | INTRAMUSCULAR | Status: AC
  Administered 2018-05-03: 10 mg via INTRAVENOUS
  Filled 2018-05-03: qty 2

## 2018-05-03 NOTE — Discharge Instructions (Addendum)
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 This means the areas inside you head where the spinal fluid flows are enlarged and needs to be evaluated further.

## 2018-05-03 NOTE — ED Provider Notes (Signed)
[facility] EMERGENCY DEPARTMENT Provider Note   CSN: [id number] Arrival date & time: 05/02/18  2233  Time seen 12:38 AM   History   Chief Complaint Chief Complaint  Patient presents with  . Headache    HPI Kathy Barr is a 62 y.o. female.  HPI patient states she had cervical surgery done by Dr. Lorin Mercy at [facility] on May 17.  She was in the hospital overnight.  She states while in the hospital she had a frontal headache that radiated over the top into the back of her head.  It is been coming and going.  She saw Dr. Lorin Mercy on the 23rd and follow-up and he advised her to take Tylenol for her headache.  She reports she had nausea and vomiting 3 days after she left the hospital and has not had any the past 2 days.  She denies fever but has had some chills.  She describes her headache as sharp and states her eyes hurt without visual changes.  She denies numbness or tingling of her extremities.  She denies a history of headaches.  She denies being on blood thinners.  She states she "cannot eat" however when I asked her about it she is is not clear whether she just does not have an appetite or she is having difficulty swallowing or she is nauseated.  Patient states before surgery she was having pain in her shoulders.  She states she was given hydrocodone after surgery and states "I cannot take that".  She states it does not help and she thinks it makes her nausea worse.  PCP Alycia Rossetti, MD   Past Medical History:  Diagnosis Date  . Arthritis    "all over" (03/18/2013)  . Carpal tunnel syndrome    "right" (03/18/2013)  . Fracture 2013   RLE  . GERD (gastroesophageal reflux disease)    OTC  . Hypertension   . IBS (irritable bowel syndrome)   . Osteoarthritis of left hip   . Seizures ([place]) 1960's; 1978   "when I was little; when I was 7 months pregnant" (03/18/2013)  . Shortness of breath    "not since I quit smoking" (03/18/2013)    Patient Active Problem List   Diagnosis Date Noted  . Cervical spinal stenosis 04/24/2018  . Cervical spondylosis 04/24/2018  . Other spondylosis with radiculopathy, cervical region 03/05/2018  . Hypertriglyceridemia 08/27/2017  . Vertigo 02/04/2017  . Tobacco use 02/04/2017  . Frozen shoulder 10/24/2015  . Atrophic vulvovaginitis 06/05/2015  . DDD (degenerative disc disease), lumbar 01/13/2015  . IBS (irritable bowel syndrome) 07/12/2014  . Bowel habit changes 01/12/2014  . Osteoarthritis of right hip 03/16/2013    Class: Chronic  . Generalized OA 10/29/2012  . Essential hypertension, benign 10/29/2012  . Obesity 10/29/2012  . Chronic neck pain 10/29/2012    Past Surgical History:  Procedure Laterality Date  . ANTERIOR CERVICAL DECOMP/DISCECTOMY FUSION  ?2003  . ANTERIOR CERVICAL DECOMP/DISCECTOMY FUSION N/A 04/24/2018   Procedure: C6-7 PLATE REMOVAL, M8-4, C5-6 ANTERIOR CERVICAL DECOMPRESSION/DISCECTOMY & FUSION, ALLOGRAFT, PLATE;  Surgeon: Marybelle Killings, MD;  Location: [place];  Service: Orthopedics;  Laterality: N/A;  . CHOLECYSTECTOMY  2003  . COLONOSCOPY N/A 02/17/2014   Procedure: COLONOSCOPY;  Surgeon: Rogene Houston, MD;  [facility];  Service: Endoscopy;  Laterality: N/A;  145-moved to 10:30 Ann to notify pt  . FOOT ARTHROTOMY Right 1990's   pin, after removing a bone   . JOINT REPLACEMENT Left  hip and right  . pinched nerve    . TOTAL HIP ARTHROPLASTY  06/30/2012   Procedure: TOTAL HIP ARTHROPLASTY;  Surgeon: Jessy Oto, MD;  Location: [place];  Service: Orthopedics;  Laterality: Left;  Left total hip replacement with metal and polypropylene pore coated implants  . TOTAL HIP ARTHROPLASTY Right 03/16/2013   Procedure: RIGHT TOTAL HIP ARTHROPLASTY- right ;  Surgeon: Jessy Oto, MD;  Location: [place];  Service: Orthopedics;  Laterality: Right;  . TUBAL LIGATION  1978  . VAGINAL HYSTERECTOMY  ~ 2003     OB History   None      Home Medications    Prior to Admission medications     Medication Sig Start Date End Date Taking? Authorizing Provider  albuterol (PROVENTIL HFA;VENTOLIN HFA) 108 (90 Base) MCG/ACT inhaler Inhale 2 puffs into the lungs every 6 (six) hours as needed for wheezing or shortness of breath. 08/27/17   Alycia Rossetti, MD  Calcium Carb-Cholecalciferol (CALCIUM 600+D3 PO) Take 1 tablet by mouth 2 (two) times daily.    [provider]  hydrochlorothiazide (HYDRODIURIL) 25 MG tablet Take 25 mg by mouth daily.    [provider]  HYDROcodone-acetaminophen (NORCO) 7.5-325 MG tablet Take 1 tablet by mouth every 6 (six) hours as needed for moderate pain. 04/24/18   Lanae Crumbly, PA-C  loratadine (CLARITIN) 10 MG tablet Take 10 mg by mouth daily as needed for allergies.    [provider]  losartan (COZAAR) 100 MG tablet Take 100 mg by mouth daily.    [provider]  NARCAN 4 MG/0.1ML LIQD nasal spray kit  04/08/18   [provider]    Family History Family History  Problem Relation Age of Onset  . Hypertension Father   . Diabetes Father   . Hypertension Sister   . Diabetes Sister   . Hypertension Brother   . Diabetes Brother   . Diabetes Mother   . Hypertension Mother   . Hyperlipidemia Mother   . COPD Brother   . Heart disease Brother   . Hypertension Brother     Social History Social History   Tobacco Use  . Smoking status: Former Smoker    Packs/day: 0.25    Years: 25.00    Pack years: 6.25    Types: Cigarettes  . Smokeless tobacco: Never Used  . Tobacco comment: Quit 3 months ago  Substance Use Topics  . Alcohol use: No    Alcohol/week: 0.0 oz  . Drug use: No  on disability for arthritis Quit smoking 2 months ago, was smoking 1-1/2 packs/day  Allergies   Patient has no known allergies.   Review of Systems Review of Systems  All other systems reviewed and are negative.    Physical Exam Updated Vital Signs BP (!) 119/102 (BP Location: Right Arm)   Pulse (!) 101   Temp 98 F  (36.7 C) (Oral)   Resp 17   Ht 5' 4" (1.626 m)   Wt 85.3 kg (188 lb)   SpO2 97%   BMI 32.27 kg/m   Vital signs normal except borderline tachycardia   Physical Exam  Constitutional: She appears well-developed and well-nourished. No distress.  HENT:  Head: Normocephalic.  Eyes: Pupils are equal, round, and reactive to light. EOM are normal.  Neck:  Pt has a soft collar in place  Nursing note and vitals reviewed.    ED Treatments / Results  Labs (all labs ordered are listed, but only abnormal  results are displayed) Labs Reviewed - No data to display  EKG None  Radiology Ct Head Wo Contrast  Result Date: 05/03/2018 CLINICAL DATA:  Headache EXAM: CT HEAD WITHOUT CONTRAST TECHNIQUE: Contiguous axial images were obtained from the base of the skull through the vertex without intravenous contrast. COMPARISON:  None. FINDINGS: Brain: There is no mass, hemorrhage or extra-axial collection. The lateral ventricles are massively dilated, out of proportion to the extra-axial CSF spaces. The third ventricle is also dilated. There is no acute or chronic infarction. No edema. Absent corpus callosum and septum pellucidum. Vascular: No abnormal hyperdensity of the major intracranial arteries or dural venous sinuses. No intracranial atherosclerosis. Skull: The visualized skull base, calvarium and extracranial soft tissues are normal. Sinuses/Orbits: No fluid levels or advanced mucosal thickening of the visualized paranasal sinuses. No mastoid or middle ear effusion. The orbits are normal. IMPRESSION: 1. No acute hemorrhage. 2. Massively dilated lateral and third ventricles without evidence of edema. 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 Disproportionate dilatation of the ventricles compared to the extra-axial CSF spaces may also be seen in normal pressure hydrocephalus. Correlate for associated symptoms. Electronically Signed   By: Ulyses Jarred M.D.   On: 05/03/2018 02:00     Procedures Procedures (including critical care time)  Medications Ordered in ED Medications  sodium chloride 0.9 % bolus 1,000 mL (0 mLs Intravenous Stopped 05/03/18 0230)  ondansetron (ZOFRAN) injection 4 mg (4 mg Intravenous Given 05/03/18 0112)  metoCLOPramide (REGLAN) injection 10 mg (10 mg Intravenous Given 05/03/18 0115)  diphenhydrAMINE (BENADRYL) injection 12.5 mg (12.5 mg Intravenous Given 05/03/18 0114)     Initial Impression / Assessment and Plan / ED Course  I have reviewed the triage vital signs and the nursing notes.  Pertinent labs & imaging results that were available during my care of the patient were reviewed by me and considered in my medical decision making (see chart for details).  Patient was given IV fluids and a migraine cocktail.  Since she does not have a history of headaches a CT scan was done.  Recheck at 4:20 AM patient states she feels much better and feels ready to be discharged.    05:52 AM Dr Annette Stable, dates when he looks at her MRI of her neck from January 2015 she had large ventricles then.  He feels she can follow-up in the office.  Review of the [place] shows patient gets #30 hydrocodone 5/325 about every 1 to 2 months by her PCP, the last time was April 29, after her surgery she got #40 hydrocodone 7.5 mg/325 on May 18  Final Clinical Impressions(s) / ED Diagnoses   Final diagnoses:  Nonintractable headache, unspecified chronicity pattern, unspecified headache type  Hydrocephalus, unspecified type    ED Discharge Orders    None      Plan discharge  Rolland Porter, MD, Barbette Or, MD 05/03/18 [phone]

## 2018-05-08 NOTE — Discharge Summary (Addendum)
Patient ID: Kathy Barr MRN: [id number] DOB/AGE: Sep 21, 1956 62 y.o.  Admit date: 04/24/2018 Discharge date: 05/08/2018  Admission Diagnoses:  Active Problems:   Cervical spinal stenosis   Cervical spondylosis   Discharge Diagnoses:  Active Problems:   Cervical spinal stenosis   Cervical spondylosis  status post Procedure(s): C6-7 PLATE REMOVAL, H4-1, C5-6 ANTERIOR CERVICAL DECOMPRESSION/DISCECTOMY & FUSION, ALLOGRAFT, PLATE  Past Medical History:  Diagnosis Date  . Arthritis    "all over" (03/18/2013)  . Carpal tunnel syndrome    "right" (03/18/2013)  . Fracture 2013   RLE  . GERD (gastroesophageal reflux disease)    OTC  . Hypertension   . IBS (irritable bowel syndrome)   . Osteoarthritis of left hip   . Seizures ([place]) 1960's; 1978   "when I was little; when I was 7 months pregnant" (03/18/2013)  . Shortness of breath    "not since I quit smoking" (03/18/2013)    Surgeries: Procedure(s): C6-7 PLATE REMOVAL, P3-7, C5-6 ANTERIOR CERVICAL DECOMPRESSION/DISCECTOMY & FUSION, ALLOGRAFT, PLATE on 08/10/4096   Consultants:   Discharged Condition: Improved  Hospital Course: LASHARON DUNIVAN is an 62 y.o. female who was admitted 04/24/2018 for operative treatment of cervical stenosis. Patient failed conservative treatments (please see the history and physical for the specifics) and had severe unremitting pain that affects sleep, daily activities and work/hobbies. After pre-op clearance, the patient was taken to the operating room on 04/24/2018 and underwent  Procedure(s): C6-7 PLATE REMOVAL, D5-3, C5-6 ANTERIOR CERVICAL DECOMPRESSION/DISCECTOMY & FUSION, ALLOGRAFT, PLATE.    Patient was given perioperative antibiotics:  Anti-infectives (From admission, onward)   Start     Dose/Rate Route Frequency Ordered Stop   04/24/18 1400  ceFAZolin (ANCEF) IVPB 1 g/50 mL premix     1 g 100 mL/hr over 30 Minutes Intravenous Every 8 hours 04/24/18 1110 04/24/18 2208   04/24/18 0600   ceFAZolin (ANCEF) IVPB 2g/100 mL premix  Status:  Discontinued     2 g 200 mL/hr over 30 Minutes Intravenous On call to O.R. 04/24/18 0551 04/24/18 1343   04/24/18 0555  ceFAZolin (ANCEF) 2-4 GM/100ML-% IVPB    Note to Pharmacy:  Nyoka Cowden   : cabinet override      04/24/18 0555 04/24/18 1759       Patient was given sequential compression devices and early ambulation to prevent DVT.   Patient benefited maximally from hospital stay and there were no complications. At the time of discharge, the patient was urinating/moving their bowels without difficulty, tolerating a regular diet, pain is controlled with oral pain medications and they have been cleared by PT/OT.   Recent vital signs: No data found.   Recent laboratory studies: No results for input(s): WBC, HGB, HCT, PLT, NA, K, CL, CO2, BUN, CREATININE, GLUCOSE, INR, CALCIUM in the last 72 hours.  Invalid input(s): PT, 2   Discharge Medications:   Allergies as of 04/25/2018   No Known Allergies     Medication List    STOP taking these medications   aspirin 81 MG tablet   cyclobenzaprine 10 MG tablet Commonly known as:  FLEXERIL   HYDROcodone-acetaminophen 5-325 MG tablet Commonly known as:  NORCO Replaced by:  HYDROcodone-acetaminophen 7.5-325 MG tablet     TAKE these medications   albuterol 108 (90 Base) MCG/ACT inhaler Commonly known as:  PROVENTIL HFA;VENTOLIN HFA Inhale 2 puffs into the lungs every 6 (six) hours as needed for wheezing or shortness of breath.   CALCIUM 600+D3 PO Take 1 tablet  by mouth 2 (two) times daily.   hydrochlorothiazide 25 MG tablet Commonly known as:  HYDRODIURIL Take 25 mg by mouth daily.   HYDROcodone-acetaminophen 7.5-325 MG tablet Commonly known as:  NORCO Take 1 tablet by mouth every 6 (six) hours as needed for moderate pain. Replaces:  HYDROcodone-acetaminophen 5-325 MG tablet   loratadine 10 MG tablet Commonly known as:  CLARITIN Take 10 mg by mouth daily as needed for  allergies.   losartan 100 MG tablet Commonly known as:  COZAAR Take 100 mg by mouth daily.       Diagnostic Studies: Dg Cervical Spine 2-3 Views  Result Date: 04/24/2018 CLINICAL DATA:  Anterior cervical fusion of C4-5 and C5-6. EXAM: DG C-ARM 61-120 MIN; CERVICAL SPINE - 2-3 VIEW FLUOROSCOPY TIME:  11 seconds. COMPARISON:  MRI of October 10, 2017. FINDINGS: Two intraoperative fluoroscopic images were obtained of the cervical spine. These images demonstrate the patient be status post surgical anterior fusion of C4-5 and C5-6. Good alignment of the vertebral bodies is noted. IMPRESSION: Status post surgical anterior fusion of C4-5 and C5-6. Electronically Signed   By: Marijo Conception, M.D.   On: 04/24/2018 10:40   Ct Head Wo Contrast  Result Date: 05/03/2018 CLINICAL DATA:  Headache EXAM: CT HEAD WITHOUT CONTRAST TECHNIQUE: Contiguous axial images were obtained from the base of the skull through the vertex without intravenous contrast. COMPARISON:  None. FINDINGS: Brain: There is no mass, hemorrhage or extra-axial collection. The lateral ventricles are massively dilated, out of proportion to the extra-axial CSF spaces. The third ventricle is also dilated. There is no acute or chronic infarction. No edema. Absent corpus callosum and septum pellucidum. Vascular: No abnormal hyperdensity of the major intracranial arteries or dural venous sinuses. No intracranial atherosclerosis. Skull: The visualized skull base, calvarium and extracranial soft tissues are normal. Sinuses/Orbits: No fluid levels or advanced mucosal thickening of the visualized paranasal sinuses. No mastoid or middle ear effusion. The orbits are normal. IMPRESSION: 1. No acute hemorrhage. 2. Massively dilated lateral and third ventricles without evidence of edema. 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 Disproportionate dilatation of the ventricles compared to the extra-axial CSF spaces may also be seen in normal pressure  hydrocephalus. Correlate for associated symptoms. Electronically Signed   By: Ulyses Jarred M.D.   On: 05/03/2018 02:00   Dg C-arm 1-60 Min  Result Date: 04/24/2018 CLINICAL DATA:  Anterior cervical fusion of C4-5 and C5-6. EXAM: DG C-ARM 61-120 MIN; CERVICAL SPINE - 2-3 VIEW FLUOROSCOPY TIME:  11 seconds. COMPARISON:  MRI of October 10, 2017. FINDINGS: Two intraoperative fluoroscopic images were obtained of the cervical spine. These images demonstrate the patient be status post surgical anterior fusion of C4-5 and C5-6. Good alignment of the vertebral bodies is noted. IMPRESSION: Status post surgical anterior fusion of C4-5 and C5-6. Electronically Signed   By: Marijo Conception, M.D.   On: 04/24/2018 10:40   Xr Cervical Spine 2 Or 3 Views  Result Date: 04/30/2018 [facility] lateral C-spine x-rays demonstrate C4-5 C5-6 anterior cervical discectomy and fusion with removal of previous C6-7 plate.  Good position of plate screws and graft. Impression: Satisfactory postop fusion C4-C6 with plate and screws.   Discharge Instructions    Call MD / Call 911   Complete by:  As directed    If you experience chest pain or shortness of breath, CALL 911 and be transported to the hospital emergency room.  If you develope a fever above 101 F, pus (white drainage)  or increased drainage or redness at the wound, or calf pain, call your surgeon's office.   Constipation Prevention   Complete by:  As directed    Drink plenty of fluids.  Prune juice may be helpful.  You may use a stool softener, such as Colace (over the counter) 100 mg twice a day.  Use MiraLax (over the counter) for constipation as needed.   Diet - low sodium heart healthy   Complete by:  As directed    Increase activity slowly as tolerated   Complete by:  As directed       Follow-up Information    Schedule an appointment as soon as possible for a visit with Marybelle Killings, MD.   Specialty:  Orthopedic Surgery Why:  need return office visit one week  postop with Dr Verlene Mayer information: [place] [address] [phone]           Discharge Plan:  discharge to home Disposition:     Signed: Benjiman Core  05/08/2018, 3:09 PM

## 2018-05-13 ENCOUNTER — Telehealth (INDEPENDENT_AMBULATORY_CARE_PROVIDER_SITE_OTHER): Payer: Self-pay | Admitting: Orthopaedic Surgery

## 2018-05-13 NOTE — Telephone Encounter (Signed)
I called patient and advised. 

## 2018-05-13 NOTE — Telephone Encounter (Signed)
Patient called to see how to wrap her neck brace with saran wrap so she can wash her hair also she said the bandage/strips are coming off underneath the brace and wants to know if this is ok.  Please advise # [phone]

## 2018-06-04 ENCOUNTER — Ambulatory Visit (INDEPENDENT_AMBULATORY_CARE_PROVIDER_SITE_OTHER): Payer: Self-pay

## 2018-06-04 ENCOUNTER — Ambulatory Visit (INDEPENDENT_AMBULATORY_CARE_PROVIDER_SITE_OTHER): Payer: Medicare Other | Admitting: Orthopaedic Surgery

## 2018-06-04 ENCOUNTER — Encounter (INDEPENDENT_AMBULATORY_CARE_PROVIDER_SITE_OTHER): Payer: Self-pay | Admitting: Orthopaedic Surgery

## 2018-06-04 VITALS — BP 125/74 | HR 99 | Ht 64.0 in | Wt 190.0 lb

## 2018-06-04 DIAGNOSIS — M4322 Fusion of spine, cervical region: Secondary | ICD-10-CM

## 2018-06-04 NOTE — Progress Notes (Signed)
Office Visit Note   Patient: Kathy Barr           Date of Birth: Oct 27, 1956           MRN: [id number] Visit Date: 06/04/2018              Requested by: Alycia Rossetti, MD [address] [place], [place] [address] PCP: Alycia Rossetti, MD   Assessment & Plan: Visit Diagnoses:  1. Fusion of spine of cervical region     Plan: X-rays show good incorporation and no motion on fusion at 4 5 and C5-6.  She is happy with the surgical result.  Discontinue collar neck incisions well-healed good relief of her hand pain and numbness.  I will check her back again on an as-needed basis.  Follow-Up Instructions: Return if symptoms worsen or fail to improve.   Orders:  Orders Placed This Encounter  Procedures  . XR Cervical Spine 2 or 3 views   No orders of the defined types were placed in this encounter.     Procedures: No procedures performed   Clinical Data: No additional findings.   Subjective: Chief Complaint  Patient presents with  . Neck - Routine Post Op    HPI 62 year old female returns post C6-7 plate removal and 2 level cervical fusion C4-5, C5-6.  Surgery date was 04/24/2018.  X-rays today flexion-extension shows no motion at the fused levels.  She is getting progressive graft incorporation.  Review of Systems reviewed updated unchanged from 10/16/2017 other than as listed in HPI.   Objective: Vital Signs: BP 125/74   Pulse 99   Ht 5\' 4"  (1.626 m)   Wt 190 lb (86.2 kg)   BMI 32.61 kg/m   Physical Exam  Constitutional: She is oriented to person, place, and time. She appears well-developed.  HENT:  Head: Normocephalic.  Right Ear: External ear normal.  Left Ear: External ear normal.  Eyes: Pupils are equal, round, and reactive to light.  Neck: No tracheal deviation present. No thyromegaly present.  Cardiovascular: Normal rate.  Pulmonary/Chest: Effort normal.  Abdominal: Soft.  Neurological: She is alert and oriented to person, place, and time.    Skin: Skin is warm and dry.  Psychiatric: She has a normal mood and affect. Her behavior is normal.    Ortho Exam patient has good strength upper extremities good range of motion of her shoulders no numbness in her hands good sensation.  Normal heel toe gait.  Specialty Comments:  No specialty comments available.  Imaging: No results found.   PMFS History: Patient Active Problem List   Diagnosis Date Noted  . Fusion of spine of cervical region 04/24/2018  . Other spondylosis with radiculopathy, cervical region 03/05/2018  . Hypertriglyceridemia 08/27/2017  . Vertigo 02/04/2017  . Tobacco use 02/04/2017  . Frozen shoulder 10/24/2015  . Atrophic vulvovaginitis 06/05/2015  . DDD (degenerative disc disease), lumbar 01/13/2015  . IBS (irritable bowel syndrome) 07/12/2014  . Bowel habit changes 01/12/2014  . Osteoarthritis of right hip 03/16/2013    Class: Chronic  . Generalized OA 10/29/2012  . Essential hypertension, benign 10/29/2012  . Obesity 10/29/2012   Past Medical History:  Diagnosis Date  . Arthritis    "all over" (03/18/2013)  . Carpal tunnel syndrome    "right" (03/18/2013)  . Fracture 2013   RLE  . GERD (gastroesophageal reflux disease)    OTC  . Hypertension   . IBS (irritable bowel syndrome)   . Osteoarthritis  of left hip   . Seizures ([place]) 1960's; 1978   "when I was little; when I was 7 months pregnant" (03/18/2013)  . Shortness of breath    "not since I quit smoking" (03/18/2013)    Family History  Problem Relation Age of Onset  . Hypertension Father   . Diabetes Father   . Hypertension Sister   . Diabetes Sister   . Hypertension Brother   . Diabetes Brother   . Diabetes Mother   . Hypertension Mother   . Hyperlipidemia Mother   . COPD Brother   . Heart disease Brother   . Hypertension Brother     Past Surgical History:  Procedure Laterality Date  . ANTERIOR CERVICAL DECOMP/DISCECTOMY FUSION  ?2003  . ANTERIOR CERVICAL DECOMP/DISCECTOMY  FUSION N/A 04/24/2018   Procedure: C6-7 PLATE REMOVAL, E7-2, C5-6 ANTERIOR CERVICAL DECOMPRESSION/DISCECTOMY & FUSION, ALLOGRAFT, PLATE;  Surgeon: Marybelle Killings, MD;  Location: [place];  Service: Orthopedics;  Laterality: N/A;  . CHOLECYSTECTOMY  2003  . COLONOSCOPY N/A 02/17/2014   Procedure: COLONOSCOPY;  Surgeon: Rogene Houston, MD;  [facility];  Service: Endoscopy;  Laterality: N/A;  145-moved to 10:30 Ann to notify pt  . FOOT ARTHROTOMY Right 1990's   pin, after removing a bone   . JOINT REPLACEMENT Left    hip and right  . pinched nerve    . TOTAL HIP ARTHROPLASTY  06/30/2012   Procedure: TOTAL HIP ARTHROPLASTY;  Surgeon: Jessy Oto, MD;  Location: [place];  Service: Orthopedics;  Laterality: Left;  Left total hip replacement with metal and polypropylene pore coated implants  . TOTAL HIP ARTHROPLASTY Right 03/16/2013   Procedure: RIGHT TOTAL HIP ARTHROPLASTY- right ;  Surgeon: Jessy Oto, MD;  Location: [facility];  Service: Orthopedics;  Laterality: Right;  . TUBAL LIGATION  1978  . VAGINAL HYSTERECTOMY  ~ 2003   Social History   Occupational History  . Not on file  Tobacco Use  . Smoking status: Former Smoker    Packs/day: 0.25    Years: 25.00    Pack years: 6.25    Types: Cigarettes  . Smokeless tobacco: Never Used  . Tobacco comment: Quit 3 months ago  Substance and Sexual Activity  . Alcohol use: No    Alcohol/week: 0.0 oz  . Drug use: No  . Sexual activity: Yes

## 2018-06-05 ENCOUNTER — Ambulatory Visit: Payer: Medicare Other | Admitting: Family Medicine

## 2018-06-16 ENCOUNTER — Other Ambulatory Visit: Payer: Self-pay | Admitting: Family Medicine

## 2018-06-26 ENCOUNTER — Encounter: Payer: Self-pay | Admitting: Family Medicine

## 2018-06-26 ENCOUNTER — Other Ambulatory Visit: Payer: Self-pay

## 2018-06-26 ENCOUNTER — Ambulatory Visit (INDEPENDENT_AMBULATORY_CARE_PROVIDER_SITE_OTHER): Payer: Medicare Other | Admitting: Family Medicine

## 2018-06-26 VITALS — BP 126/80 | HR 80 | Temp 97.9°F | Resp 14 | Ht 64.0 in | Wt 193.0 lb

## 2018-06-26 DIAGNOSIS — M5136 Other intervertebral disc degeneration, lumbar region: Secondary | ICD-10-CM | POA: Diagnosis not present

## 2018-06-26 DIAGNOSIS — I10 Essential (primary) hypertension: Secondary | ICD-10-CM | POA: Diagnosis not present

## 2018-06-26 DIAGNOSIS — B9689 Other specified bacterial agents as the cause of diseases classified elsewhere: Secondary | ICD-10-CM | POA: Diagnosis not present

## 2018-06-26 DIAGNOSIS — N760 Acute vaginitis: Secondary | ICD-10-CM

## 2018-06-26 DIAGNOSIS — J029 Acute pharyngitis, unspecified: Secondary | ICD-10-CM | POA: Diagnosis not present

## 2018-06-26 LAB — WET PREP FOR TRICH, YEAST, CLUE

## 2018-06-26 MED ORDER — METRONIDAZOLE 500 MG PO TABS: 500.0000 mg | ORAL_TABLET | Freq: Two times a day (BID) | ORAL | 0 refills | Status: DC

## 2018-06-26 MED ORDER — HYDROCODONE-ACETAMINOPHEN 5-325 MG PO TABS: 1.0000 | ORAL_TABLET | Freq: Four times a day (QID) | ORAL | 0 refills | Status: DC | PRN

## 2018-06-26 MED ORDER — FIRST-DUKES MOUTHWASH MT SUSP: OROMUCOSAL | 0 refills | Status: DC

## 2018-06-26 MED ORDER — FLUCONAZOLE 150 MG PO TABS: 150.0000 mg | ORAL_TABLET | Freq: Once | ORAL | 0 refills | Status: AC

## 2018-06-26 NOTE — Assessment & Plan Note (Signed)
Controlled no changes to losartan 

## 2018-06-26 NOTE — Progress Notes (Signed)
   Subjective:    Patient ID: Kathy Barr, female    DOB: 1956-10-23, 62 y.o.   MRN: [id number]  Patient presents for Follow-up (is fasting)   Pt here to f/u chronic medical problems. Husband was recently admitted to hospital with CAP/Empyema.  She has had sore throat, since her neck surgery as well, had ETT.  Has to clear her throat a lot, has mild cough  Has itching in vaginal area, no discharge. Has not used any  HTN- taking BP meds losartan as prescribed   Quit smoking in March of this year  Chronic pain was given norco 7.5-325mg  from surgeon, but they make her sick, does not have any pain meds currently, I was prescribing norco 5-325mg   Medications reviewed    Review Of Systems:  GEN- denies fatigue, fever, weight loss,weakness, recent illness HEENT- denies eye drainage, change in vision, nasal discharge, CVS- denies chest pain, palpitations RESP- denies SOB, cough, wheeze ABD- denies N/V, change in stools, abd pain GU- denies dysuria, hematuria, dribbling, incontinence MSK- denies joint pain, muscle aches, injury Neuro- denies headache, dizziness, syncope, seizure activity       Objective:    BP 126/80   Pulse 80   Temp 97.9 F (36.6 C) (Oral)   Resp 14   Ht 5\' 4"  (1.626 m)   Wt 193 lb (87.5 kg)   SpO2 97%   BMI 33.13 kg/m  GEN- NAD, alert and oriented x3 HEENT- PERRL, EOMI, non injected sclera, pink conjunctiva, MMM, oropharynx injected, no exudates, no tonsilar enlargement  Neck- Supple, no thyromegaly, no LAD CVS- RRR, no murmur RESP-CTAB ABD-NABS,soft,NT,ND EXT- No edema Pulses- Radial, DP- 2+        Assessment & Plan:      Problem List Items Addressed This Visit      Unprioritized   DDD (degenerative disc disease), lumbar    Chronic pain from neck and back, refilled norco 5-325mg  #30 tablets      Relevant Medications   HYDROcodone-acetaminophen (NORCO) 5-325 MG tablet   Essential hypertension, benign - Primary    Controlled no  changes to losartan       Other Visit Diagnoses    Pharyngitis, unspecified etiology       Strep neg, throat culture sent likely MTF with surgery irritation from ETT, post nasal drip, given magic mouth wash   Relevant Orders   STREP GROUP A AG, W/REFLEX TO CULT   BV (bacterial vaginosis)       Treat with flagyl, will also give diflucan to take at end of antibiotic course   Relevant Medications   Diphenhyd-Hydrocort-Nystatin (FIRST-DUKES MOUTHWASH) SUSP   metroNIDAZOLE (FLAGYL) 500 MG tablet   fluconazole (DIFLUCAN) 150 MG tablet   Other Relevant Orders   WET PREP FOR TRICH, YEAST, CLUE      Note: This dictation was prepared with Dragon dictation along with smaller phrase technology. Any transcriptional errors that result from this process are unintentional.

## 2018-06-26 NOTE — Patient Instructions (Addendum)
F/U 6 months for Physical  Pain medication refilled Antibiotics sent to pharmacy Mouth wash at pharmacy Take diflucan after the antibiotics

## 2018-06-26 NOTE — Assessment & Plan Note (Signed)
Chronic pain from neck and back, refilled norco 5-325mg  #30 tablets

## 2018-06-28 LAB — CULTURE, GROUP A STREP
MICRO NUMBER: 90857393
SPECIMEN QUALITY:: ADEQUATE

## 2018-06-28 LAB — STREP GROUP A AG, W/REFLEX TO CULT: STREPTOCOCCUS, GROUP A SCREEN (DIRECT): NOT DETECTED

## 2018-07-13 ENCOUNTER — Telehealth: Payer: Self-pay | Admitting: *Deleted

## 2018-07-13 MED ORDER — FIRST-DUKES MOUTHWASH MT SUSP: OROMUCOSAL | 0 refills | Status: DC

## 2018-07-13 NOTE — Telephone Encounter (Signed)
Received call from patient.   Reports that she has completed all magic mouthwash and continues to have sore throat. Recommended to continue mouth wash. Prescription sent to pharmacy.

## 2018-07-15 NOTE — Congregational Nurse Program (Signed)
Congregational Nurse Program Note  Date of Encounter: 07/15/2018  Past Medical History: Past Medical History:  Diagnosis Date  . Arthritis    "all over" (03/18/2013)  . Carpal tunnel syndrome    "right" (03/18/2013)  . Fracture 2013   RLE  . GERD (gastroesophageal reflux disease)    OTC  . Hypertension   . IBS (irritable bowel syndrome)   . Osteoarthritis of left hip   . Seizures ([place]) 1960's; 1978   "when I was little; when I was 7 months pregnant" (03/18/2013)  . Shortness of breath    "not since I quit smoking" (03/18/2013)    Encounter Details: CNP Questionnaire - 06/17/18 1000      Questionnaire   Patient Status  Not Applicable    Race  White or Caucasian    Location Patient Served At  [employer], [employer];Medicare    Uninsured  Not Applicable    Food  No food insecurities    Housing/Utilities  Yes, have permanent housing    Transportation  No transportation needs    Interpersonal Safety  Yes, feel physically and emotionally safe where you currently live    Medication  No medication insecurities    Medical Provider  Yes    Referrals  Not Applicable    ED Visit Averted  Not Applicable    Life-Saving Intervention Made  Not Applicable     Seen at the [employer] pantry [address] [place], [place] [employer] [phone]

## 2018-07-23 ENCOUNTER — Inpatient Hospital Stay (INDEPENDENT_AMBULATORY_CARE_PROVIDER_SITE_OTHER): Payer: Medicare Other | Admitting: Orthopaedic Surgery

## 2018-07-27 ENCOUNTER — Other Ambulatory Visit: Payer: Self-pay | Admitting: Family Medicine

## 2018-07-27 MED ORDER — HYDROCODONE-ACETAMINOPHEN 5-325 MG PO TABS: 1.0000 | ORAL_TABLET | Freq: Four times a day (QID) | ORAL | 0 refills | Status: DC | PRN

## 2018-07-27 NOTE — Telephone Encounter (Signed)
Ok to refill??  Last office visit/ refill 06/26/2018. 

## 2018-07-27 NOTE — Telephone Encounter (Signed)
Patient calling to get refill on her hydrocodone  °[facility]  °

## 2018-09-07 ENCOUNTER — Other Ambulatory Visit: Payer: Self-pay | Admitting: Family Medicine

## 2018-09-07 MED ORDER — HYDROCODONE-ACETAMINOPHEN 5-325 MG PO TABS: 1.0000 | ORAL_TABLET | Freq: Four times a day (QID) | ORAL | 0 refills | Status: DC | PRN

## 2018-09-07 NOTE — Telephone Encounter (Signed)
Patient is requesting a refill on Hydrocodone   LOV: 06/26/18  LRF:   07/27/18

## 2018-09-07 NOTE — Telephone Encounter (Signed)
Refill on [facility]codone to [facility] [place].  °

## 2018-09-10 ENCOUNTER — Ambulatory Visit (INDEPENDENT_AMBULATORY_CARE_PROVIDER_SITE_OTHER): Payer: Medicare Other | Admitting: Orthopaedic Surgery

## 2018-09-24 ENCOUNTER — Ambulatory Visit (INDEPENDENT_AMBULATORY_CARE_PROVIDER_SITE_OTHER): Payer: Medicare Other | Admitting: Orthopaedic Surgery

## 2018-09-24 ENCOUNTER — Ambulatory Visit (INDEPENDENT_AMBULATORY_CARE_PROVIDER_SITE_OTHER): Payer: Self-pay

## 2018-09-24 ENCOUNTER — Encounter (INDEPENDENT_AMBULATORY_CARE_PROVIDER_SITE_OTHER): Payer: Self-pay | Admitting: Orthopaedic Surgery

## 2018-09-24 VITALS — BP 128/94 | HR 91 | Ht 64.0 in | Wt 190.0 lb

## 2018-09-24 DIAGNOSIS — M4322 Fusion of spine, cervical region: Secondary | ICD-10-CM | POA: Diagnosis not present

## 2018-09-24 NOTE — Progress Notes (Signed)
Office Visit Note   Patient: Kathy Barr           Date of Birth: 09/16/1956           MRN: [id number] Visit Date: 09/24/2018              Requested by: Alycia Rossetti, MD [address] [place], [place] [address] PCP: Alycia Rossetti, MD   Assessment & Plan: Visit Diagnoses:  1. Fusion of spine of cervical region     Plan: We will check her back again in 6 months that she still having some residual symptoms we can repeat cervical spine AP lateral and lateral flexion-extension x-rays.  She is gotten good improvement of her preop pain since the surgery.  Follow-Up Instructions: No follow-ups on file.   Orders:  Orders Placed This Encounter  Procedures  . XR Cervical Spine 2 or 3 views   No orders of the defined types were placed in this encounter.     Procedures: No procedures performed   Clinical Data: No additional findings.   Subjective: Chief Complaint  Patient presents with  . Neck - Follow-up    04/24/18 C6-7 plate removal, C4-8 and C5-6 ACDF, Allograft, Plate    HPI 62 year old female returns 5 months post plate removal at G8-9 and 2 level fusion C4-5, C5-6.  She is occasionally had some dysphasia intermittently.  She also had some soreness in her throat and was prescribed some Dukes mouthwash made up by her pharmacy.  She denies any hand or arm numbness..  She states she is been walking better.  She is worked on trying to lose some weight and is trying to avoid sodas with sugar.  Review of Systems updated unchanged since her neck surgery in May 2019.   Objective: Vital Signs: BP (!) 128/94   Pulse 91   Ht 5\' 4"  (1.626 m)   Wt 190 lb (86.2 kg)   BMI 32.61 kg/m   Physical Exam  Constitutional: She is oriented to person, place, and time. She appears well-developed.  HENT:  Head: Normocephalic.  Right Ear: External ear normal.  Left Ear: External ear normal.  Eyes: Pupils are equal, round, and reactive to light.  Neck: No tracheal deviation  present. No thyromegaly present.  Cardiovascular: Normal rate.  Pulmonary/Chest: Effort normal.  Abdominal: Soft.  Neurological: She is alert and oriented to person, place, and time.  Skin: Skin is warm and dry.  Psychiatric: She has a normal mood and affect. Her behavior is normal.    Ortho Exam well-healed anterior cervical incision.  No brachial plexus tenderness.  The upper extremities.  Normal gait pattern no myelopathic changes.  Specialty Comments:  No specialty comments available.  Imaging: No results found.   PMFS History: Patient Active Problem List   Diagnosis Date Noted  . Fusion of spine of cervical region 04/24/2018  . Other spondylosis with radiculopathy, cervical region 03/05/2018  . Hypertriglyceridemia 08/27/2017  . Vertigo 02/04/2017  . Frozen shoulder 10/24/2015  . Atrophic vulvovaginitis 06/05/2015  . DDD (degenerative disc disease), lumbar 01/13/2015  . IBS (irritable bowel syndrome) 07/12/2014  . Osteoarthritis of right hip 03/16/2013    Class: Chronic  . Generalized OA 10/29/2012  . Essential hypertension, benign 10/29/2012  . Obesity 10/29/2012   Past Medical History:  Diagnosis Date  . Arthritis    "all over" (03/18/2013)  . Carpal tunnel syndrome    "right" (03/18/2013)  . Fracture 2013   RLE  .  GERD (gastroesophageal reflux disease)    OTC  . Hypertension   . IBS (irritable bowel syndrome)   . Osteoarthritis of left hip   . Seizures ([place]) 1960's; 1978   "when I was little; when I was 7 months pregnant" (03/18/2013)  . Shortness of breath    "not since I quit smoking" (03/18/2013)    Family History  Problem Relation Age of Onset  . Hypertension Father   . Diabetes Father   . Hypertension Sister   . Diabetes Sister   . Hypertension Brother   . Diabetes Brother   . Diabetes Mother   . Hypertension Mother   . Hyperlipidemia Mother   . COPD Brother   . Heart disease Brother   . Hypertension Brother     Past Surgical History:    Procedure Laterality Date  . ANTERIOR CERVICAL DECOMP/DISCECTOMY FUSION  ?2003  . ANTERIOR CERVICAL DECOMP/DISCECTOMY FUSION N/A 04/24/2018   Procedure: C6-7 PLATE REMOVAL, B2-0, C5-6 ANTERIOR CERVICAL DECOMPRESSION/DISCECTOMY & FUSION, ALLOGRAFT, PLATE;  Surgeon: Marybelle Killings, MD;  Location: [facility];  Service: Orthopedics;  Laterality: N/A;  . CHOLECYSTECTOMY  2003  . COLONOSCOPY N/A 02/17/2014   Procedure: COLONOSCOPY;  Surgeon: Rogene Houston, MD;  [facility];  Service: Endoscopy;  Laterality: N/A;  145-moved to 10:30 Ann to notify pt  . FOOT ARTHROTOMY Right 1990's   pin, after removing a bone   . JOINT REPLACEMENT Left    hip and right  . pinched nerve    . TOTAL HIP ARTHROPLASTY  06/30/2012   Procedure: TOTAL HIP ARTHROPLASTY;  Surgeon: Jessy Oto, MD;  Location: [facility];  Service: Orthopedics;  Laterality: Left;  Left total hip replacement with metal and polypropylene pore coated implants  . TOTAL HIP ARTHROPLASTY Right 03/16/2013   Procedure: RIGHT TOTAL HIP ARTHROPLASTY- right ;  Surgeon: Jessy Oto, MD;  Location: [facility];  Service: Orthopedics;  Laterality: Right;  . TUBAL LIGATION  1978  . VAGINAL HYSTERECTOMY  ~ 2003   Social History   Occupational History  . Not on file  Tobacco Use  . Smoking status: Former Smoker    Packs/day: 0.25    Years: 25.00    Pack years: 6.25    Types: Cigarettes  . Smokeless tobacco: Never Used  . Tobacco comment: Quit 3 months ago  Substance and Sexual Activity  . Alcohol use: No    Alcohol/week: 0.0 standard drinks  . Drug use: No  . Sexual activity: Yes

## 2018-10-14 ENCOUNTER — Other Ambulatory Visit: Payer: Self-pay | Admitting: Family Medicine

## 2018-10-14 NOTE — Telephone Encounter (Signed)
Ok to refill??  Last office visit 06/26/2018.  Last refill 09/07/2018.

## 2018-10-14 NOTE — Telephone Encounter (Signed)
Refill on [facility]cod[facility].  °

## 2018-10-15 MED ORDER — HYDROCODONE-ACETAMINOPHEN 5-325 MG PO TABS: 1.0000 | ORAL_TABLET | Freq: Four times a day (QID) | ORAL | 0 refills | Status: DC | PRN

## 2018-11-03 ENCOUNTER — Other Ambulatory Visit: Payer: Self-pay | Admitting: Family Medicine

## 2018-11-03 MED ORDER — HYDROCODONE-ACETAMINOPHEN 5-325 MG PO TABS: 1.0000 | ORAL_TABLET | Freq: Four times a day (QID) | ORAL | 0 refills | Status: DC | PRN

## 2018-11-03 NOTE — Telephone Encounter (Signed)
Kathy Barr  Patient requesting refill on her hydrocodone

## 2018-11-03 NOTE — Telephone Encounter (Signed)
Ok to refill??  Last office visit 06/26/2018.  Last refill 10/15/2018.

## 2018-12-07 ENCOUNTER — Other Ambulatory Visit: Payer: Self-pay | Admitting: Family Medicine

## 2018-12-07 NOTE — Telephone Encounter (Signed)
Refill on [facility]cod[facility].

## 2018-12-08 MED ORDER — HYDROCODONE-ACETAMINOPHEN 5-325 MG PO TABS: 1.0000 | ORAL_TABLET | Freq: Four times a day (QID) | ORAL | 0 refills | Status: DC | PRN

## 2018-12-08 NOTE — Telephone Encounter (Signed)
Ok to refill??  Last office visit 06/26/2018.  Last refill 11/03/2018.

## 2018-12-18 ENCOUNTER — Other Ambulatory Visit: Payer: Self-pay | Admitting: Family Medicine

## 2018-12-28 ENCOUNTER — Ambulatory Visit (INDEPENDENT_AMBULATORY_CARE_PROVIDER_SITE_OTHER): Payer: Medicare Other | Admitting: Family Medicine

## 2018-12-28 ENCOUNTER — Encounter: Payer: Self-pay | Admitting: Family Medicine

## 2018-12-28 ENCOUNTER — Other Ambulatory Visit: Payer: Self-pay

## 2018-12-28 ENCOUNTER — Other Ambulatory Visit: Payer: Self-pay | Admitting: *Deleted

## 2018-12-28 VITALS — BP 130/82 | HR 90 | Temp 97.8°F | Resp 16 | Ht 64.0 in | Wt 198.0 lb

## 2018-12-28 DIAGNOSIS — E781 Pure hyperglyceridemia: Secondary | ICD-10-CM

## 2018-12-28 DIAGNOSIS — E6609 Other obesity due to excess calories: Secondary | ICD-10-CM | POA: Diagnosis not present

## 2018-12-28 DIAGNOSIS — Z0000 Encounter for general adult medical examination without abnormal findings: Secondary | ICD-10-CM | POA: Diagnosis not present

## 2018-12-28 DIAGNOSIS — Z114 Encounter for screening for human immunodeficiency virus [HIV]: Secondary | ICD-10-CM

## 2018-12-28 DIAGNOSIS — I10 Essential (primary) hypertension: Secondary | ICD-10-CM

## 2018-12-28 DIAGNOSIS — Z6833 Body mass index (BMI) 33.0-33.9, adult: Secondary | ICD-10-CM

## 2018-12-28 DIAGNOSIS — M5136 Other intervertebral disc degeneration, lumbar region: Secondary | ICD-10-CM

## 2018-12-28 DIAGNOSIS — M159 Polyosteoarthritis, unspecified: Secondary | ICD-10-CM

## 2018-12-28 MED ORDER — HYDROCODONE-ACETAMINOPHEN 5-325 MG PO TABS: 1.0000 | ORAL_TABLET | Freq: Four times a day (QID) | ORAL | 0 refills | Status: DC | PRN

## 2018-12-28 MED ORDER — ZOSTER VAC RECOMB ADJUVANTED 50 MCG/0.5ML IM SUSR: 0.5000 mL | Freq: Once | INTRAMUSCULAR | 1 refills | Status: AC

## 2018-12-28 NOTE — Patient Instructions (Signed)
Shingles sent to pharmacy  We will call with lab results  Work on the diet  F/U 4 months

## 2018-12-28 NOTE — Progress Notes (Signed)
Subjective:   Patient presents for Medicare Annual/Subsequent preventive examination.   Pt here for wellness exam  Chronic pain- neck surgery last year and has chronic back pain, generalized OA/ on norco   HTN- taking bp meds HCTZ and losartan   Hyperlipidemia- high triglycerides   No acute problems  Review Past Medical/Family/Social: Per EMR    Risk Factors  Current exercise habits: walking a few days a week  Dietary issues discussed: Yes  Cardiac risk factors: Obesity (BMI >= 30 kg/m2). HTN  Depression Screen  (Note: if answer to either of the following is "Yes", a more complete depression screening is indicated)  Over the past two weeks, have you felt down, depressed or hopeless? No Over the past two weeks, have you felt little interest or pleasure in doing things? No Have you lost interest or pleasure in daily life? No Do you often feel hopeless? No Do you cry easily over simple problems? No   Activities of Daily Living  In your present state of health, do you have any difficulty performing the following activities?:  Driving? No  Managing money? No  Feeding yourself? No  Getting from bed to chair? No  Climbing a flight of stairs? No  Preparing food and eating?: No  Bathing or showering? No  Getting dressed: No  Getting to the toilet? No  Using the toilet:No  Moving around from place to place: No  In the past year have you fallen or had a near fall?:No  Are you sexually active? No  Do you have more than one partner? No   Hearing Difficulties: No  Do you often ask people to speak up or repeat themselves? No  Do you experience ringing or noises in your ears? No Do you have difficulty understanding soft or whispered voices? No  Do you feel that you have a problem with memory? No Do you often misplace items? No  Do you feel safe at home? Yes  Cognitive Testing  Alert? Yes Normal Appearance?Yes  Oriented to person? Yes Place? Yes  Time? Yes  Recall of three  objects? Yes  Can perform simple calculations? Yes  Displays appropriate judgment?Yes  Can read the correct time from a watch face?Yes   List the Names of Other Physician/Practitioners you currently use:   Dr. Lorin Mercy  Screening Tests / Date Colonoscopy UTD done 2015                    Zostavax - shingrix  Dose #1 done Hep C screening-  Negative Mammogram - UTD  Influenza Vaccine  UTD Tetanus/tdap UTD  Bone Density- normal in 2016  ROS: GEN- denies fatigue, fever, weight loss,weakness, recent illness HEENT- denies eye drainage, change in vision, nasal discharge, CVS- denies chest pain, palpitations RESP- denies SOB, cough, wheeze ABD- denies N/V, change in stools, abd pain GU- denies dysuria, hematuria, dribbling, incontinence MSK- denies joint pain, muscle aches, injury Neuro- denies headache, dizziness, syncope, seizure activity  Physical: vitals reviewed  GEN- NAD, alert and oriented x3 HEENT- PERRL, EOMI, non injected sclera, pink conjunctiva, MMM, oropharynx clear Neck- Supple, no thryomegaly, no bruit  CVS- RRR, no murmur RESP-CTAB ABD-nabs,soft,NT,ND EXT- No edema Pulses- Radial, DP- 2+    Assessment:    Annual wellness medicare exam   Plan:    During the course of the visit the patient was educated and counseled about appropriate screening and preventive services including:   Audit C/ Fall/Depression screen negative  Discussed advanced directives, she has  forms at work   HTN- well controlled, fasting labs to be done    Chronic pain- taking norco as prescribed  Hyperlipidemia recheck lipids discussed diet  Obesity per above- discussed cut out fried foods, decrease portion sizes HIV screening to be done  Shingrix sent to pharmacy   Full code       Diet review for nutrition referral? Yes ____ Not Indicated __x__  Patient Instructions (the written plan) was given to the patient.  Medicare Attestation  I have personally reviewed:  The patient's  medical and social history  Their use of alcohol, tobacco or illicit drugs  Their current medications and supplements  The patient's functional ability including ADLs,fall risks, home safety risks, cognitive, and hearing and visual impairment  Diet and physical activities  Evidence for depression or mood disorders  The patient's weight, height, BMI, and visual acuity have been recorded in the chart. I have made referrals, counseling, and provided education to the patient based on review of the above and I have provided the patient with a written personalized care plan for preventive services.

## 2018-12-29 LAB — COMPREHENSIVE METABOLIC PANEL
AG Ratio: 1.3 (calc) (ref 1.0–2.5)
ALT: 12 U/L (ref 6–29)
AST: 16 U/L (ref 10–35)
Albumin: 4 g/dL (ref 3.6–5.1)
Alkaline phosphatase (APISO): 79 U/L (ref 33–130)
BILIRUBIN TOTAL: 1 mg/dL (ref 0.2–1.2)
BUN: 11 mg/dL (ref 7–25)
CALCIUM: 9.9 mg/dL (ref 8.6–10.4)
CHLORIDE: 102 mmol/L (ref 98–110)
CO2: 24 mmol/L (ref 20–32)
Creat: 0.75 mg/dL (ref 0.50–0.99)
Globulin: 3.2 g/dL (calc) (ref 1.9–3.7)
Glucose, Bld: 95 mg/dL (ref 65–99)
Potassium: 3.7 mmol/L (ref 3.5–5.3)
Sodium: 139 mmol/L (ref 135–146)
Total Protein: 7.2 g/dL (ref 6.1–8.1)

## 2018-12-29 LAB — LIPID PANEL
CHOLESTEROL: 168 mg/dL (ref <200)
HDL: 61 mg/dL (ref >50)
LDL Cholesterol (Calc): 84 mg/dL (calc)
Non-HDL Cholesterol (Calc): 107 mg/dL (calc) (ref <130)
Total CHOL/HDL Ratio: 2.8 (calc) (ref <5.0)
Triglycerides: 133 mg/dL (ref <150)

## 2018-12-29 LAB — CBC WITH DIFFERENTIAL/PLATELET
ABSOLUTE MONOCYTES: 410 {cells}/uL (ref 200–950)
BASOS ABS: 29 {cells}/uL (ref 0–200)
Basophils Relative: 0.5 %
EOS ABS: 143 {cells}/uL (ref 15–500)
Eosinophils Relative: 2.5 %
HEMATOCRIT: 41.2 % (ref 35.0–45.0)
HEMOGLOBIN: 14 g/dL (ref 11.7–15.5)
LYMPHS ABS: 1488 {cells}/uL (ref 850–3900)
MCH: 27.9 pg (ref 27.0–33.0)
MCHC: 34 g/dL (ref 32.0–36.0)
MCV: 82.1 fL (ref 80.0–100.0)
MPV: 10.2 fL (ref 7.5–12.5)
Monocytes Relative: 7.2 %
NEUTROS ABS: 3631 {cells}/uL (ref 1500–7800)
Neutrophils Relative %: 63.7 %
Platelets: 221 10*3/uL (ref 140–400)
RBC: 5.02 10*6/uL (ref 3.80–5.10)
RDW: 12.9 % (ref 11.0–15.0)
Total Lymphocyte: 26.1 %
WBC: 5.7 10*3/uL (ref 3.8–10.8)

## 2018-12-29 LAB — HIV ANTIBODY (ROUTINE TESTING W REFLEX): HIV 1&2 Ab, 4th Generation: NONREACTIVE

## 2018-12-31 ENCOUNTER — Encounter: Payer: Self-pay | Admitting: *Deleted

## 2019-01-04 ENCOUNTER — Other Ambulatory Visit (HOSPITAL_COMMUNITY): Payer: Self-pay | Admitting: Family Medicine

## 2019-01-04 DIAGNOSIS — Z1231 Encounter for screening mammogram for malignant neoplasm of breast: Secondary | ICD-10-CM

## 2019-02-01 ENCOUNTER — Other Ambulatory Visit: Payer: Self-pay | Admitting: Family Medicine

## 2019-02-01 MED ORDER — HYDROCODONE-ACETAMINOPHEN 5-325 MG PO TABS: 1.0000 | ORAL_TABLET | Freq: Four times a day (QID) | ORAL | 0 refills | Status: DC | PRN

## 2019-02-01 NOTE — Telephone Encounter (Signed)
Wants refill on hydrocodone  [facility]  [phone]

## 2019-02-01 NOTE — Telephone Encounter (Signed)
Ok to refill??  Last office visit/ refill 12/28/2018.

## 2019-02-02 ENCOUNTER — Encounter (INDEPENDENT_AMBULATORY_CARE_PROVIDER_SITE_OTHER): Payer: Self-pay | Admitting: *Deleted

## 2019-02-15 ENCOUNTER — Other Ambulatory Visit (INDEPENDENT_AMBULATORY_CARE_PROVIDER_SITE_OTHER): Payer: Self-pay | Admitting: *Deleted

## 2019-02-15 ENCOUNTER — Ambulatory Visit (HOSPITAL_COMMUNITY)
Admission: RE | Admit: 2019-02-15 | Discharge: 2019-02-15 | Disposition: A | Discharge status: Home / Self Care | Payer: Medicare Other | Source: Ambulatory Visit | Attending: Family Medicine | Admitting: Family Medicine

## 2019-02-15 DIAGNOSIS — Z86010 Personal history of colonic polyps: Secondary | ICD-10-CM

## 2019-02-15 DIAGNOSIS — Z1231 Encounter for screening mammogram for malignant neoplasm of breast: Secondary | ICD-10-CM | POA: Insufficient documentation

## 2019-02-24 ENCOUNTER — Other Ambulatory Visit: Payer: Self-pay | Admitting: Family Medicine

## 2019-03-09 ENCOUNTER — Telehealth (INDEPENDENT_AMBULATORY_CARE_PROVIDER_SITE_OTHER): Payer: Self-pay | Admitting: *Deleted

## 2019-03-09 ENCOUNTER — Encounter (INDEPENDENT_AMBULATORY_CARE_PROVIDER_SITE_OTHER): Payer: Self-pay | Admitting: *Deleted

## 2019-03-09 MED ORDER — PEG 3350-KCL-NA BICARB-NACL 420 G PO SOLR: 4000.0000 mL | Freq: Once | ORAL | 0 refills | Status: AC

## 2019-03-09 NOTE — Telephone Encounter (Signed)
Patient needs trilyte 

## 2019-03-10 ENCOUNTER — Other Ambulatory Visit: Payer: Self-pay | Admitting: Family Medicine

## 2019-03-10 MED ORDER — HYDROCODONE-ACETAMINOPHEN 5-325 MG PO TABS: 1.0000 | ORAL_TABLET | Freq: Four times a day (QID) | ORAL | 0 refills | Status: DC | PRN

## 2019-03-10 NOTE — Telephone Encounter (Signed)
Patient calling to get refill on her hydrocodone  [facility]

## 2019-03-10 NOTE — Telephone Encounter (Signed)
Ok to refill??  Last office visit 12/28/2018.  Last refill 02/01/2019.

## 2019-03-21 ENCOUNTER — Other Ambulatory Visit: Payer: Self-pay | Admitting: Family Medicine

## 2019-03-25 ENCOUNTER — Other Ambulatory Visit: Payer: Self-pay

## 2019-03-25 ENCOUNTER — Ambulatory Visit (INDEPENDENT_AMBULATORY_CARE_PROVIDER_SITE_OTHER): Payer: Medicare Other | Admitting: Orthopaedic Surgery

## 2019-03-25 ENCOUNTER — Encounter (INDEPENDENT_AMBULATORY_CARE_PROVIDER_SITE_OTHER): Payer: Self-pay | Admitting: Orthopaedic Surgery

## 2019-03-25 VITALS — Ht 64.0 in | Wt 191.0 lb

## 2019-03-25 DIAGNOSIS — M545 Low back pain, unspecified: Secondary | ICD-10-CM

## 2019-03-25 DIAGNOSIS — M4322 Fusion of spine, cervical region: Secondary | ICD-10-CM

## 2019-03-25 NOTE — Progress Notes (Signed)
Office Visit Note   Patient: Kathy Barr           Date of Birth: [birth year]           MRN: [id number] Visit Date: 03/25/2019              Requested by: Alycia Rossetti, MD [address] [place], [place] [address] PCP: Alycia Rossetti, MD   Assessment & Plan: Visit Diagnoses:  1. Low back pain without sciatica, unspecified back pain laterality, unspecified chronicity   2. Fusion of spine of cervical region     Plan: We reviewed previous lumbar MRI scan.  Recommendations are for continued ambulation progressive weight loss.  She can return if she has increased symptoms of radiculopathy or myelopathy.  Follow-Up Instructions: Return if symptoms worsen or fail to improve.   Orders:  No orders of the defined types were placed in this encounter.  No orders of the defined types were placed in this encounter.     Procedures: No procedures performed   Clinical Data: No additional findings.   Subjective: Chief Complaint  Patient presents with  . Neck - Follow-up    04/24/18 C6-7 plate removal, L3-8, C5-6 ACDF, Allograft, Plate    HPI 63 year old female returns post C6-7 plate removal and 2 level fusion C4-5 C5-13 Apr 2018.  Patient states her neck is doing well she is having some problems with shoulder discomfort.  She states she has had some heartburn which is causing her problems difficulty sleeping and she will discuss this with her PCP.  She has had some pain into her legs at times.  She denies claudication symptoms no associated bowel or bladder symptoms.  Previous MRI 2006 lumbar showed some 4 mm anterolisthesis at L4-5 and facet arthropathy at L5-S1.  Review of Systems 14 point review of systems updated positive for frozen shoulder, cervical fusion hypertension obesity history of IBS back pain high triglyceride.  She has been on chronic hydrocodone pain management.  Total hip arthroplasty right.  Otherwise negative pertains HPI.   Objective: Vital Signs: Ht 5'  4" (1.626 m)   Wt 191 lb (86.6 kg)   BMI 32.79 kg/m   Physical Exam Constitutional:      Appearance: She is well-developed.  HENT:     Head: Normocephalic.     Right Ear: External ear normal.     Left Ear: External ear normal.  Eyes:     Pupils: Pupils are equal, round, and reactive to light.  Neck:     Thyroid: No thyromegaly.     Trachea: No tracheal deviation.  Cardiovascular:     Rate and Rhythm: Normal rate.  Pulmonary:     Effort: Pulmonary effort is normal.  Abdominal:     Palpations: Abdomen is soft.  Skin:    General: Skin is warm and dry.  Neurological:     Mental Status: She is alert and oriented to person, place, and time.  Psychiatric:        Behavior: Behavior normal.     Ortho Exam well-healed cervical incision negative Spurling no pain with neck flexion.  Negative straight leg raising no sciatic notch tenderness.  Lower extremity reflexes are 2+.  Anterior tib gastrocsoleus is intact.  Specialty Comments:  No specialty comments available.  Imaging: No results found.   PMFS History: Patient Active Problem List   Diagnosis Date Noted  . Hx of colonic polyps 02/15/2019  . Fusion of spine of cervical region  04/24/2018  . Other spondylosis with radiculopathy, cervical region 03/05/2018  . Hypertriglyceridemia 08/27/2017  . Vertigo 02/04/2017  . Frozen shoulder 10/24/2015  . Atrophic vulvovaginitis 06/05/2015  . DDD (degenerative disc disease), lumbar 01/13/2015  . IBS (irritable bowel syndrome) 07/12/2014  . Osteoarthritis of right hip 03/16/2013    Class: Chronic  . Generalized OA 10/29/2012  . Essential hypertension, benign 10/29/2012  . Obesity 10/29/2012   Past Medical History:  Diagnosis Date  . Arthritis    "all over" (03/18/2013)  . Carpal tunnel syndrome    "right" (03/18/2013)  . Fracture 2013   RLE  . GERD (gastroesophageal reflux disease)    OTC  . Hypertension   . IBS (irritable bowel syndrome)   . Osteoarthritis of left hip    . Seizures ([place]) 1960's; 1978   "when I was little; when I was 7 months pregnant" (03/18/2013)  . Shortness of breath    "not since I quit smoking" (03/18/2013)    Family History  Problem Relation Age of Onset  . Hypertension Father   . Diabetes Father   . Hypertension Sister   . Diabetes Sister   . Hypertension Brother   . Diabetes Brother   . Diabetes Mother   . Hypertension Mother   . Hyperlipidemia Mother   . COPD Brother   . Heart disease Brother   . Hypertension Brother     Past Surgical History:  Procedure Laterality Date  . ANTERIOR CERVICAL DECOMP/DISCECTOMY FUSION  ?2003  . ANTERIOR CERVICAL DECOMP/DISCECTOMY FUSION N/A 04/24/2018   Procedure: C6-7 PLATE REMOVAL, X3-2, C5-6 ANTERIOR CERVICAL DECOMPRESSION/DISCECTOMY & FUSION, ALLOGRAFT, PLATE;  Surgeon: Marybelle Killings, MD;  Location: [facility];  Service: Orthopedics;  Laterality: N/A;  . CHOLECYSTECTOMY  2003  . COLONOSCOPY N/A 02/17/2014   Procedure: COLONOSCOPY;  Surgeon: Rogene Houston, MD;  [facility];  Service: Endoscopy;  Laterality: N/A;  145-moved to 10:30 Ann to notify pt  . FOOT ARTHROTOMY Right 1990's   pin, after removing a bone   . JOINT REPLACEMENT Left    hip and right  . pinched nerve    . TOTAL HIP ARTHROPLASTY  06/30/2012   Procedure: TOTAL HIP ARTHROPLASTY;  Surgeon: Jessy Oto, MD;  Location: [facility];  Service: Orthopedics;  Laterality: Left;  Left total hip replacement with metal and polypropylene pore coated implants  . TOTAL HIP ARTHROPLASTY Right 03/16/2013   Procedure: RIGHT TOTAL HIP ARTHROPLASTY- right ;  Surgeon: Jessy Oto, MD;  Location: [facility];  Service: Orthopedics;  Laterality: Right;  . TUBAL LIGATION  1978  . VAGINAL HYSTERECTOMY  ~ 2003   Social History   Occupational History  . Not on file  Tobacco Use  . Smoking status: Former Smoker    Packs/day: 0.25    Years: 25.00    Pack years: 6.25    Types: Cigarettes  . Smokeless tobacco: Never Used  . Tobacco comment:  Quit 3 months ago  Substance and Sexual Activity  . Alcohol use: No    Alcohol/week: 0.0 standard drinks  . Drug use: No  . Sexual activity: Yes

## 2019-04-06 ENCOUNTER — Other Ambulatory Visit: Payer: Self-pay | Admitting: Family Medicine

## 2019-04-06 MED ORDER — HYDROCODONE-ACETAMINOPHEN 5-325 MG PO TABS: 1.0000 | ORAL_TABLET | Freq: Four times a day (QID) | ORAL | 0 refills | Status: DC | PRN

## 2019-04-06 NOTE — Telephone Encounter (Signed)
Refill on hydrocodone to [facility] °

## 2019-04-06 NOTE — Telephone Encounter (Signed)
Ok to refill??  Last office visit 12/28/2018.  Last refill 03/10/2019.

## 2019-05-05 ENCOUNTER — Other Ambulatory Visit: Payer: Self-pay | Admitting: Family Medicine

## 2019-05-05 MED ORDER — HYDROCODONE-ACETAMINOPHEN 5-325 MG PO TABS: 1.0000 | ORAL_TABLET | Freq: Four times a day (QID) | ORAL | 0 refills | Status: DC | PRN

## 2019-05-05 NOTE — Telephone Encounter (Signed)
Refill on [facility]codone to [facility] °

## 2019-05-05 NOTE — Telephone Encounter (Signed)
Last filled 04/06/2019 Last office visit:12/28/2018

## 2019-06-02 ENCOUNTER — Other Ambulatory Visit: Payer: Self-pay | Admitting: Family Medicine

## 2019-06-02 MED ORDER — HYDROCODONE-ACETAMINOPHEN 5-325 MG PO TABS: 1.0000 | ORAL_TABLET | Freq: Four times a day (QID) | ORAL | 0 refills | Status: DC | PRN

## 2019-06-02 NOTE — Telephone Encounter (Signed)
Refill on [facility]codone to [facility] °

## 2019-06-02 NOTE — Telephone Encounter (Signed)
Ok to refill??  Last office visit 12/28/2018.  Last refill 05/05/2019.

## 2019-06-09 ENCOUNTER — Ambulatory Visit (INDEPENDENT_AMBULATORY_CARE_PROVIDER_SITE_OTHER): Payer: Medicare Other | Admitting: Family Medicine

## 2019-06-09 ENCOUNTER — Encounter: Payer: Self-pay | Admitting: Family Medicine

## 2019-06-09 ENCOUNTER — Other Ambulatory Visit: Payer: Self-pay

## 2019-06-09 VITALS — BP 128/82 | HR 90 | Temp 97.9°F | Resp 14 | Ht 64.0 in | Wt 199.0 lb

## 2019-06-09 DIAGNOSIS — H9201 Otalgia, right ear: Secondary | ICD-10-CM

## 2019-06-09 DIAGNOSIS — R42 Dizziness and giddiness: Secondary | ICD-10-CM | POA: Diagnosis not present

## 2019-06-09 DIAGNOSIS — H9191 Unspecified hearing loss, right ear: Secondary | ICD-10-CM | POA: Diagnosis not present

## 2019-06-09 MED ORDER — MECLIZINE HCL 12.5 MG PO TABS: 12.5000 mg | ORAL_TABLET | Freq: Three times a day (TID) | ORAL | 0 refills | Status: DC | PRN

## 2019-06-09 NOTE — Assessment & Plan Note (Signed)
Start meclizine BID then down to once a day

## 2019-06-09 NOTE — Progress Notes (Signed)
   Subjective:    Patient ID: Kathy Barr, female    DOB: [birth year], 63 y.o.   MRN: [id number]  Patient presents for R Inner Ear Pain (x2 weeks- states that ear feels full and she has dizziness/ vertigo)   Right ear pain , ear feels full, then she feels dizzy and room starts spinning. Initially felt it getting out of the shower. Then at bedtime she feels things crawling in her ear and she will start digging in it. She does have history of vertigo as well.  She denies any cough or congestion no sinus symptoms.  She does have some chronic scarring in her ear she states that she recalls as a child she had infections in her right ear.  Review Of Systems:  GEN- denies fatigue, fever, weight loss,weakness, recent illness HEENT- denies eye drainage, change in vision, nasal discharge, CVS- denies chest pain, palpitations RESP- denies SOB, cough, wheeze ABD- denies N/V, change in stools, abd pain GU- denies dysuria, hematuria, dribbling, incontinence MSK- denies joint pain, muscle aches, injury Neuro- denies headache, +dizziness, syncope, seizure activity       Objective:    BP 128/82   Pulse 90   Temp 97.9 F (36.6 C) (Oral)   Resp 14   Ht 5\' 4"  (1.626 m)   Wt 199 lb (90.3 kg)   SpO2 97%   BMI 34.16 kg/m  GEN- NAD, alert and oriented x3 HEENT- PERRL, EOMI, non injected sclera, pink conjunctiva, MMM, oropharynx clear, TM Scarring right ear drum, no erythema, canals clear, no wax bilat  Neck- Supple, no LAD, no bruit  CVS- RRR, no murmur RESP-CTAB NEURO-CNII-XII in tact, no focal deficits, no nystagmus EXT- No edema Pulses- Radial  2+  Decreased hearing right ear       Assessment & Plan:      Problem List Items Addressed This Visit      Unprioritized   Vertigo - Primary    Start meclizine BID then down to once a day        Other Visit Diagnoses    Right ear pain       Ear pain, no infection, on anti-histamine, also hearing loss in noted, will refer to ENT and treat  vertigo   Hearing loss of right ear, unspecified hearing loss type          Note: This dictation was prepared with Dragon dictation along with smaller phrase technology. Any transcriptional errors that result from this process are unintentional.

## 2019-06-09 NOTE — Patient Instructions (Addendum)
Take the meclizine  Referral to Dr. Benjamine Mola F/U as previous

## 2019-06-22 ENCOUNTER — Other Ambulatory Visit: Payer: Self-pay | Admitting: Family Medicine

## 2019-06-23 ENCOUNTER — Other Ambulatory Visit: Payer: Self-pay | Admitting: Family Medicine

## 2019-07-05 ENCOUNTER — Other Ambulatory Visit: Payer: Self-pay

## 2019-07-05 ENCOUNTER — Ambulatory Visit (INDEPENDENT_AMBULATORY_CARE_PROVIDER_SITE_OTHER): Payer: Medicare Other | Admitting: Otolaryngology

## 2019-07-05 ENCOUNTER — Other Ambulatory Visit: Payer: Self-pay | Admitting: Family Medicine

## 2019-07-05 DIAGNOSIS — H6121 Impacted cerumen, right ear: Secondary | ICD-10-CM

## 2019-07-05 DIAGNOSIS — H903 Sensorineural hearing loss, bilateral: Secondary | ICD-10-CM | POA: Diagnosis not present

## 2019-07-05 DIAGNOSIS — R42 Dizziness and giddiness: Secondary | ICD-10-CM

## 2019-07-05 MED ORDER — HYDROCODONE-ACETAMINOPHEN 5-325 MG PO TABS: 1.0000 | ORAL_TABLET | Freq: Four times a day (QID) | ORAL | 0 refills | Status: DC | PRN

## 2019-07-05 NOTE — Telephone Encounter (Signed)
Refill on hydrocodone to [facility]

## 2019-07-05 NOTE — Telephone Encounter (Signed)
Ok to refill??  Last office visit 06/09/2019.  Last refill 06/02/2019.

## 2019-07-07 ENCOUNTER — Other Ambulatory Visit: Payer: Self-pay | Admitting: Family Medicine

## 2019-07-19 ENCOUNTER — Encounter (INDEPENDENT_AMBULATORY_CARE_PROVIDER_SITE_OTHER): Payer: Self-pay | Admitting: *Deleted

## 2019-07-26 ENCOUNTER — Other Ambulatory Visit: Payer: Self-pay

## 2019-07-26 ENCOUNTER — Ambulatory Visit (INDEPENDENT_AMBULATORY_CARE_PROVIDER_SITE_OTHER): Payer: Self-pay

## 2019-07-26 ENCOUNTER — Telehealth (INDEPENDENT_AMBULATORY_CARE_PROVIDER_SITE_OTHER): Payer: Self-pay | Admitting: *Deleted

## 2019-07-26 NOTE — Telephone Encounter (Signed)
Referring MD/PCP: [place]   Procedure: tcs  Reason/Indication:  Hx polyps  Has patient had this procedure before?  Yes, 02/2014  If so, when, by whom and where?    Is there a family history of colon cancer?  no  Who?  What age when diagnosed?    Is patient diabetic?   no      Does patient have prosthetic heart valve or mechanical valve?  no  Do you have a pacemaker/defibrillator?  no  Has patient ever had endocarditis/atrial fibrillation? no  Does patient use oxygen? no  Has patient had joint replacement within last 12 months?  no  Is patient constipated or do they take laxatives? no  Does patient have a history of alcohol/drug use?  no  Is patient on blood thinner such as Coumadin, Plavix and/or Aspirin? no  Medications: calcium w vit d3 bid, loratadine 10 mg daily, losartan 10 mg daily, hctz 25 mg daily,   Allergies: nkda  Medication Adjustment per Dr Charlena Cross, NP:   Procedure date & time: 08/25/19 at 830

## 2019-08-02 NOTE — Telephone Encounter (Signed)
Ok to schedule with conscious sedation 

## 2019-08-03 ENCOUNTER — Other Ambulatory Visit: Payer: Self-pay

## 2019-08-03 MED ORDER — HYDROCODONE-ACETAMINOPHEN 5-325 MG PO TABS: 1.0000 | ORAL_TABLET | Freq: Four times a day (QID) | ORAL | 0 refills | Status: DC | PRN

## 2019-08-03 NOTE — Telephone Encounter (Signed)
Requested Prescriptions   Pending Prescriptions Disp Refills  . HYDROcodone-acetaminophen (NORCO) 5-325 MG tablet 30 tablet 0    Sig: Take 1 tablet by mouth every 6 (six) hours as needed for moderate pain.     Last OV 06/09/2019  Last written 07/05/2019

## 2019-08-15 IMAGING — CR DG CHEST 2V
2 series · 2 of 2 positions shown · non-contrast
Comparison: 07/26/2016

CLINICAL DATA: Preop neck surgery

EXAM:
CHEST - 2 VIEW

[w chest pa]
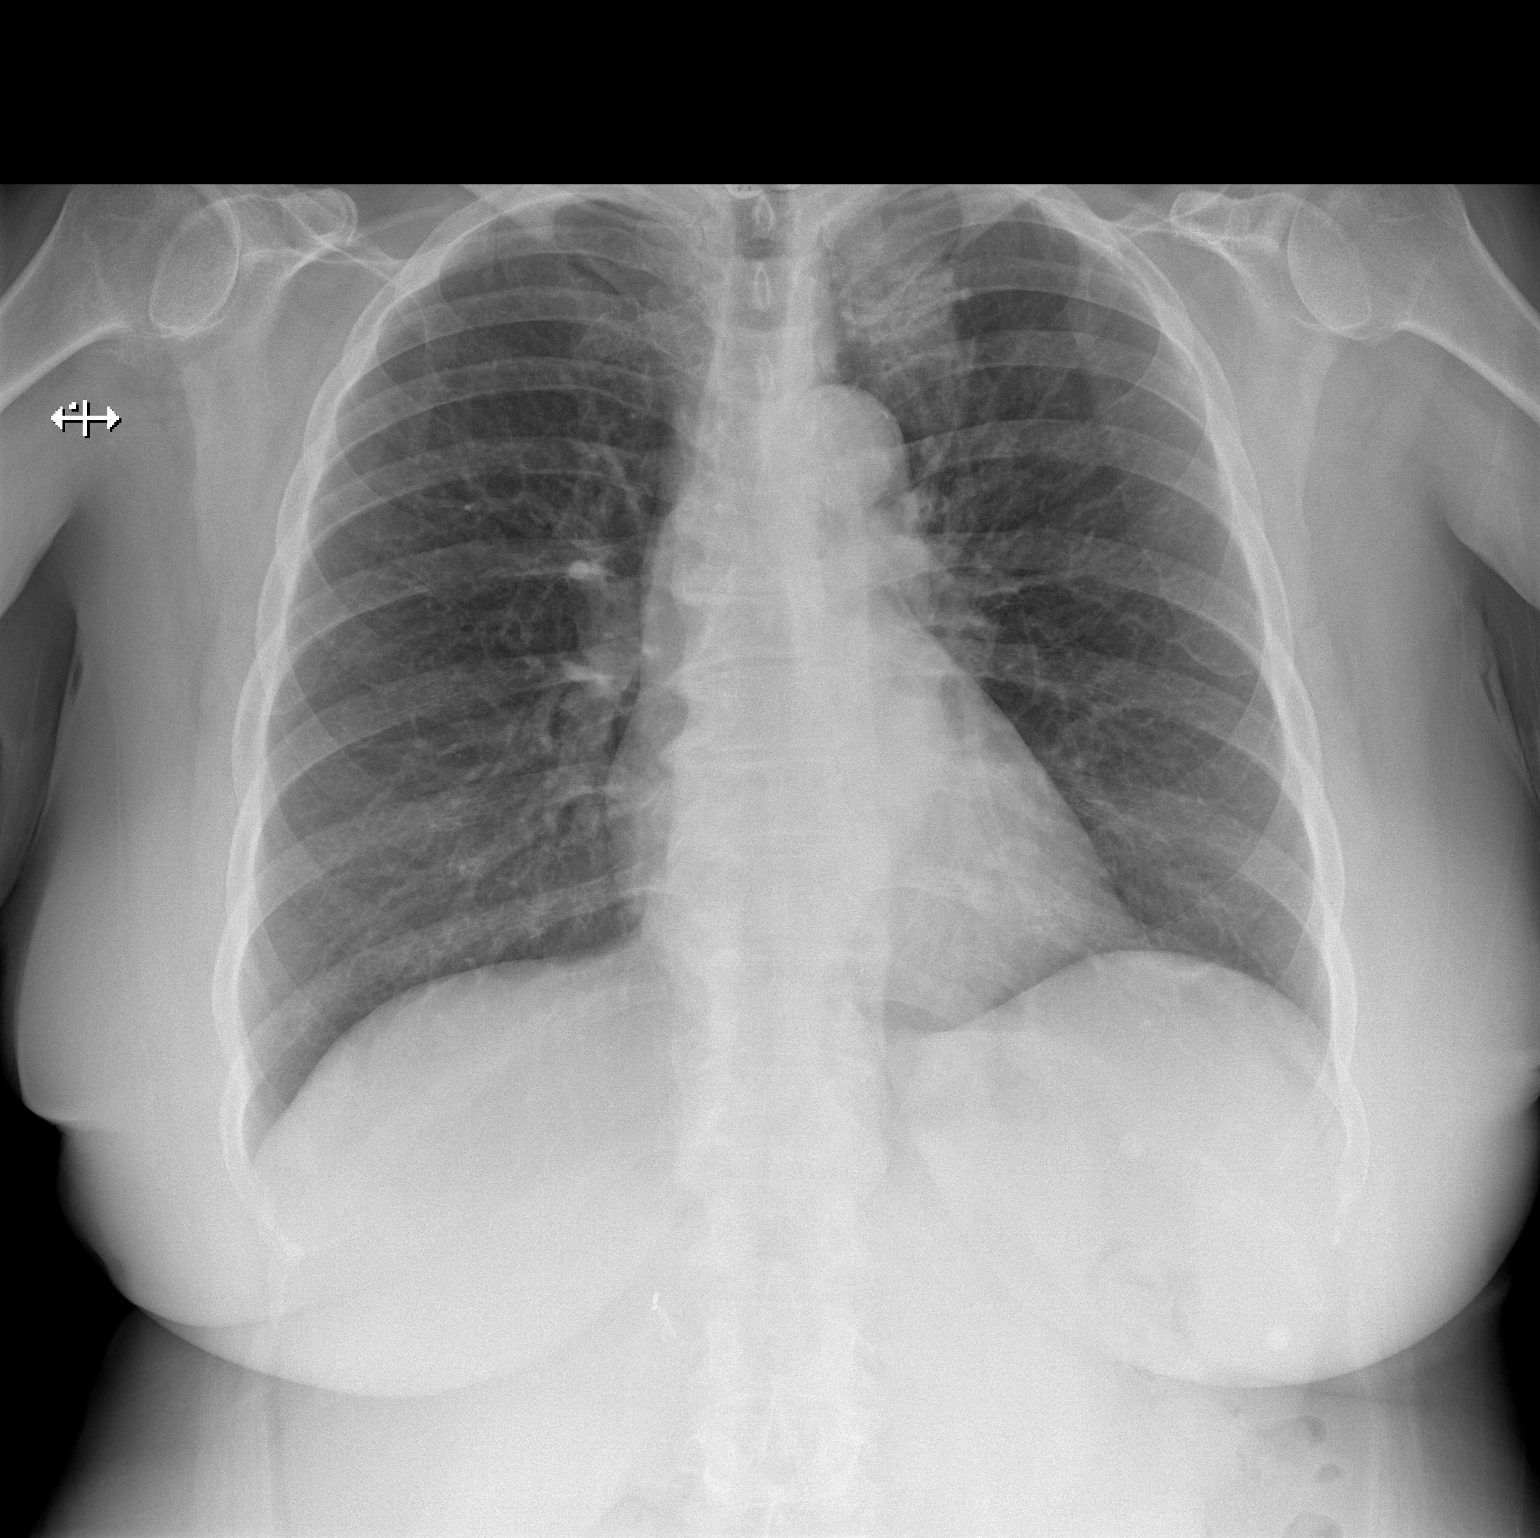

[w chest lat]
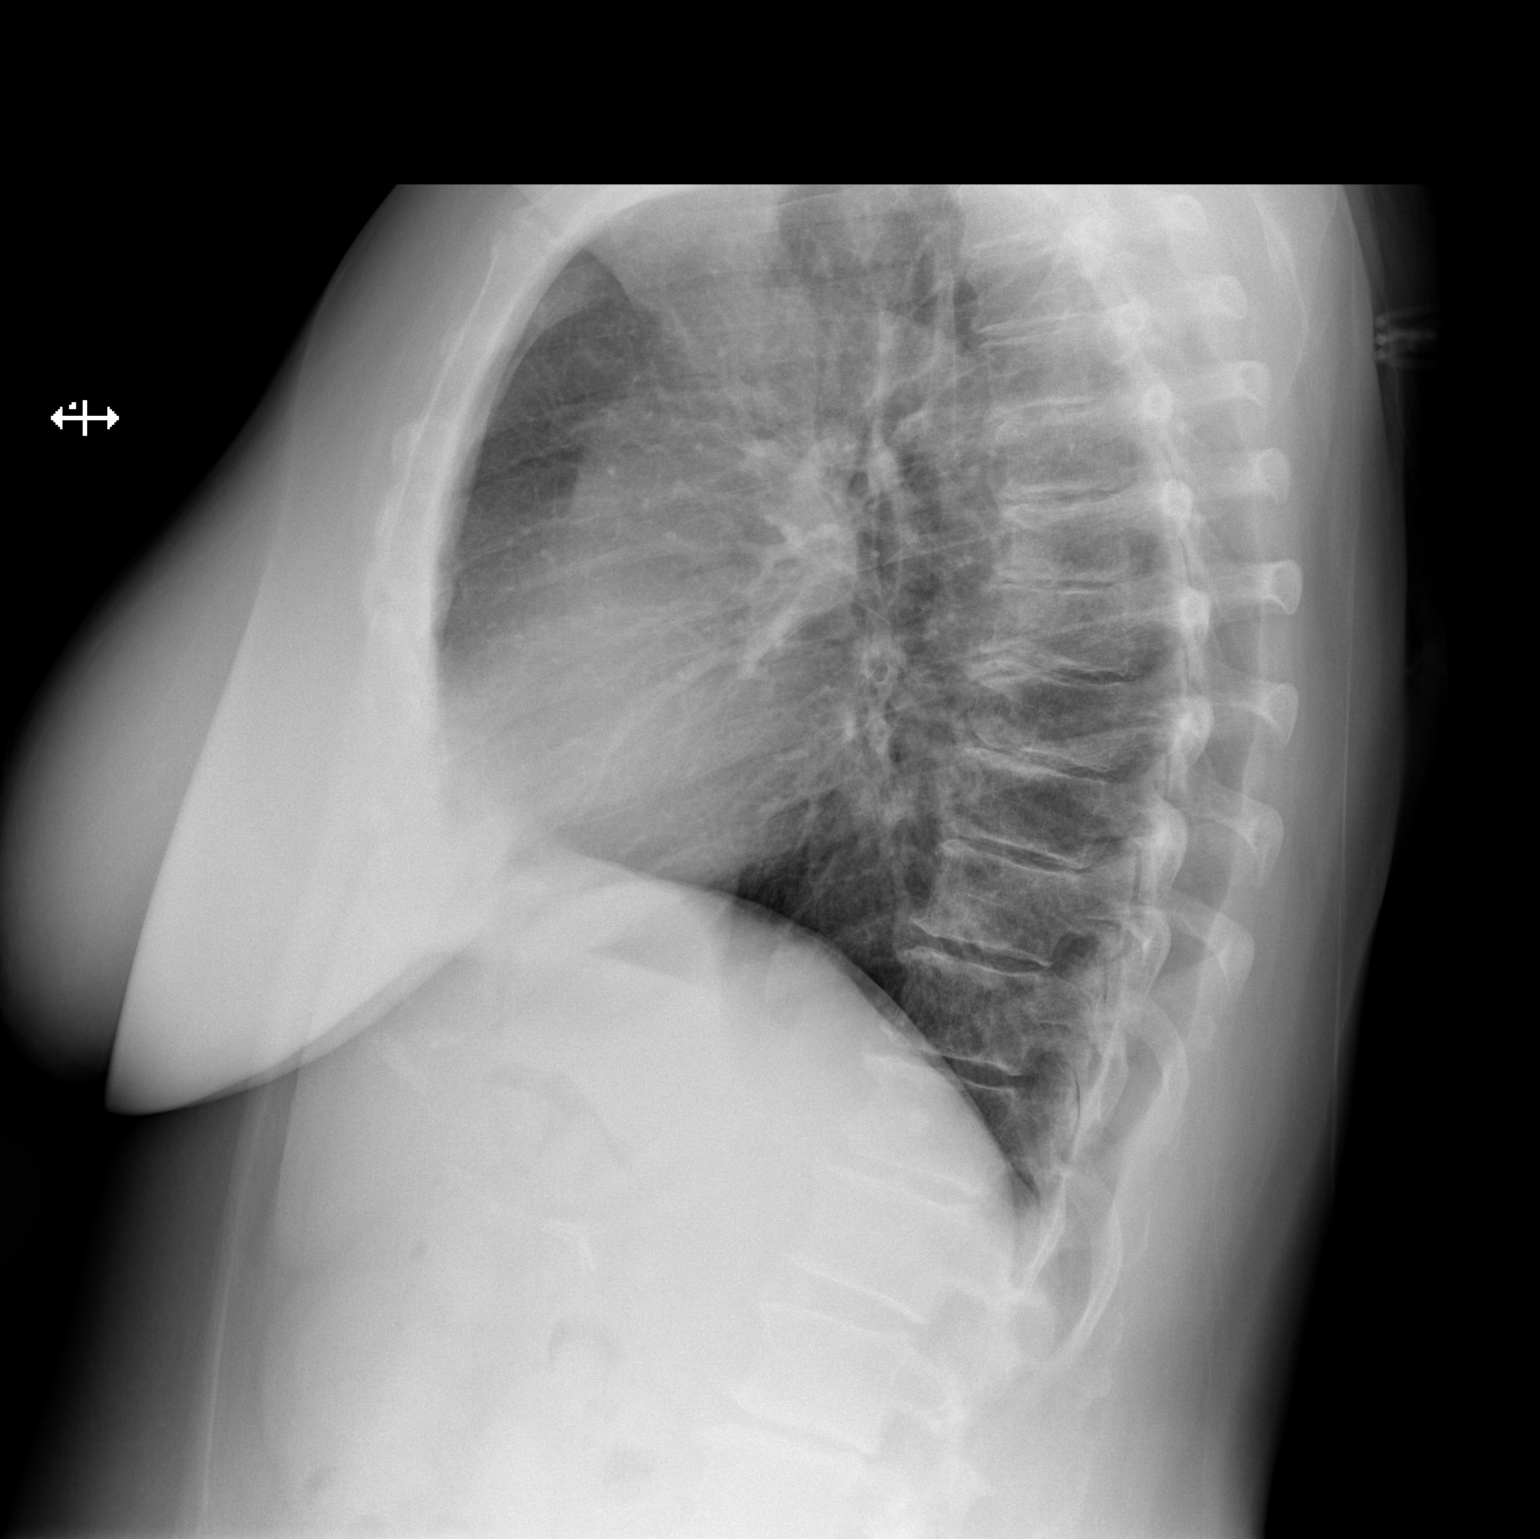

[2 of 2 positions shown; findings below may reference images not displayed]

FINDINGS: Asymmetric density left lung apex is new since the prior study.
Remaining lungs are clear. No mediastinal or hilar adenopathy

Heart size and vascularity normal.  No effusion
IMPRESSION: New asymmetric density left lung apex. Possible mass lesion or
infiltrate. CT chest recommended for further evaluation

These results will be called to the ordering clinician or
representative by the Radiologist Assistant, and communication
documented in the PACS or zVision Dashboard.

## 2019-08-23 ENCOUNTER — Inpatient Hospital Stay (HOSPITAL_COMMUNITY): Admission: RE | Admit: 2019-08-23 | Payer: Medicare Other | Source: Ambulatory Visit

## 2019-08-23 ENCOUNTER — Other Ambulatory Visit: Payer: Self-pay

## 2019-08-23 ENCOUNTER — Other Ambulatory Visit (HOSPITAL_COMMUNITY)
Admission: RE | Admit: 2019-08-23 | Discharge: 2019-08-23 | Disposition: A | Discharge status: Home / Self Care | Payer: Medicare Other | Source: Ambulatory Visit | Attending: Internal Medicine | Admitting: Internal Medicine

## 2019-08-23 DIAGNOSIS — K635 Polyp of colon: Secondary | ICD-10-CM | POA: Diagnosis not present

## 2019-08-23 DIAGNOSIS — D179 Benign lipomatous neoplasm, unspecified: Secondary | ICD-10-CM | POA: Diagnosis not present

## 2019-08-23 DIAGNOSIS — Z01812 Encounter for preprocedural laboratory examination: Secondary | ICD-10-CM | POA: Diagnosis not present

## 2019-08-23 DIAGNOSIS — Z20828 Contact with and (suspected) exposure to other viral communicable diseases: Secondary | ICD-10-CM | POA: Diagnosis not present

## 2019-08-23 LAB — SARS CORONAVIRUS 2 (TAT 6-24 HRS): SARS Coronavirus 2: NEGATIVE

## 2019-08-25 ENCOUNTER — Other Ambulatory Visit: Payer: Self-pay

## 2019-08-25 ENCOUNTER — Ambulatory Visit (HOSPITAL_COMMUNITY)
Admission: RE | Admit: 2019-08-25 | Discharge: 2019-08-25 | Disposition: A | Discharge status: Home / Self Care | Payer: Medicare Other | Attending: Internal Medicine | Admitting: Internal Medicine

## 2019-08-25 ENCOUNTER — Encounter (HOSPITAL_COMMUNITY)
Admission: RE | Disposition: A | Discharge status: Home / Self Care | Payer: Self-pay | Source: Home / Self Care | Attending: Internal Medicine

## 2019-08-25 ENCOUNTER — Other Ambulatory Visit: Payer: Self-pay | Admitting: Family Medicine

## 2019-08-25 ENCOUNTER — Encounter (HOSPITAL_COMMUNITY): Payer: Self-pay | Admitting: *Deleted

## 2019-08-25 DIAGNOSIS — Z87891 Personal history of nicotine dependence: Secondary | ICD-10-CM | POA: Diagnosis not present

## 2019-08-25 DIAGNOSIS — Z09 Encounter for follow-up examination after completed treatment for conditions other than malignant neoplasm: Secondary | ICD-10-CM | POA: Diagnosis not present

## 2019-08-25 DIAGNOSIS — I10 Essential (primary) hypertension: Secondary | ICD-10-CM | POA: Diagnosis not present

## 2019-08-25 DIAGNOSIS — D127 Benign neoplasm of rectosigmoid junction: Secondary | ICD-10-CM | POA: Diagnosis not present

## 2019-08-25 DIAGNOSIS — Z1211 Encounter for screening for malignant neoplasm of colon: Secondary | ICD-10-CM | POA: Diagnosis not present

## 2019-08-25 DIAGNOSIS — Z860100 Personal history of colon polyps, unspecified: Secondary | ICD-10-CM | POA: Insufficient documentation

## 2019-08-25 DIAGNOSIS — D175 Benign lipomatous neoplasm of intra-abdominal organs: Secondary | ICD-10-CM | POA: Diagnosis not present

## 2019-08-25 DIAGNOSIS — Z96643 Presence of artificial hip joint, bilateral: Secondary | ICD-10-CM | POA: Insufficient documentation

## 2019-08-25 DIAGNOSIS — K635 Polyp of colon: Secondary | ICD-10-CM | POA: Diagnosis not present

## 2019-08-25 HISTORY — PX: POLYPECTOMY: SHX5525

## 2019-08-25 HISTORY — PX: COLONOSCOPY: SHX5424

## 2019-08-25 SURGERY — COLONOSCOPY
Anesthesia: Moderate Sedation

## 2019-08-25 MED ORDER — MIDAZOLAM HCL 5 MG/5ML IJ SOLN
INTRAMUSCULAR | Status: DC | PRN
  Administered 2019-08-25 (×2): 1 mg via INTRAVENOUS
  Administered 2019-08-25: 2 mg via INTRAVENOUS
  Administered 2019-08-25 (×2): 1 mg via INTRAVENOUS

## 2019-08-25 MED ORDER — MIDAZOLAM HCL 5 MG/5ML IJ SOLN
INTRAMUSCULAR | Status: AC
  Filled 2019-08-25: qty 10

## 2019-08-25 MED ORDER — MEPERIDINE HCL 50 MG/ML IJ SOLN
INTRAMUSCULAR | Status: DC | PRN
  Administered 2019-08-25 (×2): 25 mg

## 2019-08-25 MED ORDER — SODIUM CHLORIDE 0.9 % IV SOLN
INTRAVENOUS | Status: DC
  Administered 2019-08-25: 1000 mL via INTRAVENOUS

## 2019-08-25 MED ORDER — MEPERIDINE HCL 50 MG/ML IJ SOLN
INTRAMUSCULAR | Status: AC
  Filled 2019-08-25: qty 1

## 2019-08-25 NOTE — Telephone Encounter (Signed)
Too early to get medication

## 2019-08-25 NOTE — Telephone Encounter (Signed)
Ok to refill??  Last office visit 06/09/2019.  Last refill 08/03/2019.

## 2019-08-25 NOTE — H&P (Signed)
Kathy Barr is an 63 y.o. female.   Chief Complaint: Patient is here for colonoscopy. HPI: Patient is 63 year old Caucasian female who is here for surveillance colonoscopy.  She had average risk screening colonoscopy in March 2015 with removal of cecal polyp which is a tubular adenoma.  Part of the polyp was coagulated.  She denies abdominal pain change in bowel habits or rectal bleeding.  She does not take aspirin anymore. Family history is negative for CRC.  Past Medical History:  Diagnosis Date  . Arthritis    "all over" (03/18/2013)  . Carpal tunnel syndrome    "right" (03/18/2013)  . Fracture 2013   RLE  . GERD (gastroesophageal reflux disease)    OTC  . Hypertension   . IBS (irritable bowel syndrome)   . Osteoarthritis of left hip   . Seizures ([place]) 1960's; 1978   "when I was little; when I was 7 months pregnant" (03/18/2013)  . Shortness of breath    "not since I quit smoking" (03/18/2013)    Past Surgical History:  Procedure Laterality Date  . ANTERIOR CERVICAL DECOMP/DISCECTOMY FUSION  ?2003  . ANTERIOR CERVICAL DECOMP/DISCECTOMY FUSION N/A 04/24/2018   Procedure: C6-7 PLATE REMOVAL, D34-534, C5-6 ANTERIOR CERVICAL DECOMPRESSION/DISCECTOMY & FUSION, ALLOGRAFT, PLATE;  Surgeon: Marybelle Killings, MD;  Location: [facility];  Service: Orthopedics;  Laterality: N/A;  . CHOLECYSTECTOMY  2003  . COLONOSCOPY N/A 02/17/2014   Procedure: COLONOSCOPY;  Surgeon: Rogene Houston, MD;  [facility];  Service: Endoscopy;  Laterality: N/A;  145-moved to 10:30 Ann to notify pt  . FOOT ARTHROTOMY Right 1990's   pin, after removing a bone   . JOINT REPLACEMENT Left    hip and right  . pinched nerve    . TOTAL HIP ARTHROPLASTY  06/30/2012   Procedure: TOTAL HIP ARTHROPLASTY;  Surgeon: Jessy Oto, MD;  Location: [facility];  Service: Orthopedics;  Laterality: Left;  Left total hip replacement with metal and polypropylene pore coated implants  . TOTAL HIP ARTHROPLASTY Right 03/16/2013   Procedure:  RIGHT TOTAL HIP ARTHROPLASTY- right ;  Surgeon: Jessy Oto, MD;  Location: [facility];  Service: Orthopedics;  Laterality: Right;  . TUBAL LIGATION  1978  . VAGINAL HYSTERECTOMY  ~ 2003    Family History  Problem Relation Age of Onset  . Hypertension Father   . Diabetes Father   . Hypertension Sister   . Diabetes Sister   . Hypertension Brother   . Diabetes Brother   . Diabetes Mother   . Hypertension Mother   . Hyperlipidemia Mother   . COPD Brother   . Heart disease Brother   . Hypertension Brother    Social History:  reports that she has quit smoking. Her smoking use included cigarettes. She has a 6.25 pack-year smoking history. She has never used smokeless tobacco. She reports that she does not drink alcohol or use drugs.  Allergies: No Known Allergies  Medications Prior to Admission  Medication Sig Dispense Refill  . acetaminophen (TYLENOL) 500 MG tablet Take 1,000 mg by mouth every 6 (six) hours as needed (pain).     Marland Kitchen albuterol (PROVENTIL HFA;VENTOLIN HFA) 108 (90 Base) MCG/ACT inhaler INHALE 2 PUFFS INTO LUNGS EVERY 6 HOURS AS NEEDED FOR WHEEZING OR SHORTNESS OF BREATH 18 g 2  . Calcium Carb-Cholecalciferol (CALCIUM 600+D3 PO) Take 1 tablet by mouth 2 (two) times daily.    . hydrochlorothiazide (HYDRODIURIL) 25 MG tablet Take 1 tablet by mouth once daily (  Patient taking differently: Take 25 mg by mouth daily. ) 90 tablet 3  . HYDROcodone-acetaminophen (NORCO) 5-325 MG tablet Take 1 tablet by mouth every 6 (six) hours as needed for moderate pain. 30 tablet 0  . loratadine (CLARITIN) 10 MG tablet Take 10 mg by mouth daily.     Marland Kitchen losartan (COZAAR) 100 MG tablet Take 1 tablet by mouth once daily (Patient taking differently: Take 100 mg by mouth daily. ) 90 tablet 0  . meclizine (ANTIVERT) 12.5 MG tablet TAKE 1 TABLET BY MOUTH THREE TIMES DAILY AS NEEDED FOR DIZZINESS (Patient taking differently: Take 12.5 mg by mouth 3 (three) times daily as needed (dizziness). ) 30 tablet 3  .  prednisoLONE acetate (PRED FORTE) 1 % ophthalmic suspension Place 1 drop into both eyes 2 (two) times daily as needed (use 1-2 drops into ears as needed for itching.).      No results found for this or any previous visit (from the past 48 hour(s)). No results found.  ROS  Blood pressure 128/73, pulse 74, temperature 98.2 F (36.8 C), temperature source Oral, resp. rate 14, height 5\' 4"  (1.626 m), weight 86.2 kg, SpO2 99 %. Physical Exam  Constitutional: She appears well-developed and well-nourished.  HENT:  Mouth/Throat: Oropharynx is clear and moist.  Eyes: Conjunctivae are normal. No scleral icterus.  Neck: No thyromegaly present.  Scar at lower neck to the left of midline.  Cardiovascular: Normal rate, regular rhythm and normal heart sounds.  No murmur heard. Respiratory: Effort normal and breath sounds normal.  GI: Soft. She exhibits no distension and no mass. There is no abdominal tenderness.  Musculoskeletal:        General: No edema.  Lymphadenopathy:    She has no cervical adenopathy.  Neurological: She is alert.  Skin: Skin is warm and dry.     Assessment/Plan History of colonic adenoma. Surveillance colonoscopy.  [name], MD 08/25/2019, 8:32 AM

## 2019-08-25 NOTE — Telephone Encounter (Signed)
Patient calling to get refill on hydrocodone   [facility]

## 2019-08-25 NOTE — Op Note (Signed)
[facility] Patient Name: Kathy Barr Procedure Date: 08/25/2019 8:23 AM MRN: [id number] Date of Birth: 01/11/1956 Attending MD: [name] , MD CSN: [id number] Age: 63 Admit Type: Outpatient Procedure:                Colonoscopy Indications:              High risk colon cancer surveillance: Personal                            history of colonic polyps Providers:                [name], MD, Gerome Sam, RN, Raphael Gibney, Technician Referring MD:             Modena Nunnery. Perry Hall, MD Medicines:                Meperidine 50 mg IV, Midazolam 7 mg IV Complications:            No immediate complications. Estimated Blood Loss:     Estimated blood loss was minimal. Procedure:                Pre-Anesthesia Assessment:                           - Prior to the procedure, a History and Physical                            was performed, and patient medications and                            allergies were reviewed. The patient's tolerance of                            previous anesthesia was also reviewed. The risks                            and benefits of the procedure and the sedation                            options and risks were discussed with the patient.                            All questions were answered, and informed consent                            was obtained. Prior Anticoagulants: The patient has                            taken no previous anticoagulant or antiplatelet                            agents. ASA Grade Assessment: II - A patient with  mild systemic disease. After reviewing the risks                            and benefits, the patient was deemed in                            satisfactory condition to undergo the procedure.                           After obtaining informed consent, the colonoscope                            was passed under direct vision. Throughout the    procedure, the patient's blood pressure, pulse, and                            oxygen saturations were monitored continuously. The                            [id number]) scope was introduced through                            the anus and advanced to the the cecum, identified                            by appendiceal orifice and ileocecal valve. The                            colonoscopy was performed without difficulty. The                            patient tolerated the procedure well. The quality                            of the bowel preparation was good. The ileocecal                            valve, appendiceal orifice, and rectum were                            photographed. Scope In: 8:41:44 AM Scope Out: 9:01:12 AM Scope Withdrawal Time: 0 hours 10 minutes 45 seconds  Total Procedure Duration: 0 hours 19 minutes 28 seconds  Findings:      The perianal and digital rectal examinations were normal.      There was a small lipoma, in the cecum.      A small polyp was found in the recto-sigmoid colon. The polyp was       sessile. Biopsies were taken with a cold forceps for histology.      The retroflexed view of the distal rectum and anal verge was normal and       showed no anal or rectal abnormalities. Impression:               - Small lipoma in the cecum.                           -  One small polyp at the recto-sigmoid colon.                            Biopsied. Moderate Sedation:      Moderate (conscious) sedation was administered by the endoscopy nurse       and supervised by the endoscopist. The following parameters were       monitored: oxygen saturation, heart rate, blood pressure, CO2       capnography and response to care. Total physician intraservice time was       25 minutes. Recommendation:           - Patient has a contact number available for                            emergencies. The signs and symptoms of potential                            delayed  complications were discussed with the                            patient. Return to normal activities tomorrow.                            Written discharge instructions were provided to the                            patient.                           - Resume previous diet today.                           - Continue present medications.                           - No aspirin, ibuprofen, naproxen, or other                            non-steroidal anti-inflammatory drugs for 1 day.                           - Await pathology results.                           - Repeat colonoscopy is recommended. The                            colonoscopy date will be determined after pathology                            results from today's exam become available for                            review. Procedure Code(s):        --- Professional ---  [id number], Colonoscopy, flexible; with biopsy, single                            or multiple                           99153, Moderate sedation; each additional 15                            minutes intraservice time                           G0500, Moderate sedation services provided by the                            same physician or other qualified health care                            professional performing a gastrointestinal                            endoscopic service that sedation supports,                            requiring the presence of an independent trained                            observer to assist in the monitoring of the                            patient's level of consciousness and physiological                            status; initial 15 minutes of intra-service time;                            patient age 23 years or older (additional time may                            be reported with [id number], as appropriate) Diagnosis Code(s):        --- Professional ---                           Z86.010, Personal history of  colonic polyps                           D17.5, Benign lipomatous neoplasm of                            intra-abdominal organs                           K63.5, Polyp of colon CPT copyright 2019 American Medical Association. All rights reserved. The codes documented in this report are preliminary and upon coder review may  be revised to meet current compliance requirements. [name], MD  [name], MD 08/25/2019 9:07:35 AM This report has been signed electronically. Number of Addenda: 0

## 2019-08-25 NOTE — Discharge Instructions (Signed)
No aspirin for 24 hours. Resume usual medications and diet as before. No driving for 24 hours. Physician will call with biopsy results.   Colonoscopy, Adult, Care After This sheet gives you information about how to care for yourself after your procedure. Your doctor may also give you more specific instructions. If you have problems or questions, call your doctor. What can I expect after the procedure? After the procedure, it is common to have:  A small amount of blood in your poop for 24 hours.  Some gas.  Mild cramping or bloating in your belly. Follow these instructions at home: General instructions  For the first 24 hours after the procedure: ? Do not drive or use machinery. ? Do not sign important documents. ? Do not drink alcohol. ? Do your daily activities more slowly than normal. ? Eat foods that are soft and easy to digest.  Take over-the-counter or prescription medicines only as told by your doctor. To help cramping and bloating:   Try walking around.  Put heat on your belly (abdomen) as told by your doctor. Use a heat source that your doctor recommends, such as a moist heat pack or a heating pad. ? Put a towel between your skin and the heat source. ? Leave the heat on for 20-30 minutes. ? Remove the heat if your skin turns bright red. This is especially important if you cannot feel pain, heat, or cold. You can get burned. Eating and drinking   Drink enough fluid to keep your pee (urine) clear or pale yellow.  Return to your normal diet as told by your doctor. Avoid heavy or fried foods that are hard to digest.  Avoid drinking alcohol for as long as told by your doctor. Contact a doctor if:  You have blood in your poop (stool) 2-3 days after the procedure. Get help right away if:  You have more than a small amount of blood in your poop.  You see large clumps of tissue (blood clots) in your poop.  Your belly is swollen.  You feel sick to your stomach  (nauseous).  You throw up (vomit).  You have a fever.  You have belly pain that gets worse, and medicine does not help your pain. Summary  After the procedure, it is common to have a small amount of blood in your poop. You may also have mild cramping and bloating in your belly.  For the first 24 hours after the procedure, do not drive or use machinery, do not sign important documents, and do not drink alcohol.  Get help right away if you have a lot of blood in your poop, feel sick to your stomach, have a fever, or have more belly pain. This information is not intended to replace advice given to you by your health care provider. Make sure you discuss any questions you have with your health care provider. Document Released: 12/28/2010 Document Revised: 09/25/2017 Document Reviewed: 08/19/2016 Elsevier Patient Education  2020 [employer].  Colon Polyps  Polyps are tissue growths inside the body. Polyps can grow in many places, including the large intestine (colon). A polyp may be a round bump or a mushroom-shaped growth. You could have one polyp or several. Most colon polyps are noncancerous (benign). However, some colon polyps can become cancerous over time. Finding and removing the polyps early can help prevent this. What are the causes? The exact cause of colon polyps is not known. What increases the risk? You are more likely to develop  this condition if you:  Have a family history of colon cancer or colon polyps.  Are older than 52 or older than 45 if you are African American.  Have inflammatory bowel disease, such as ulcerative colitis or Crohn's disease.  Have certain hereditary conditions, such as: ? Familial adenomatous polyposis. ? Lynch syndrome. ? Turcot syndrome. ? Peutz-Jeghers syndrome.  Are overweight.  Smoke cigarettes.  Do not get enough exercise.  Drink too much alcohol.  Eat a diet that is high in fat and red meat and low in fiber.  Had childhood  cancer that was treated with abdominal radiation. What are the signs or symptoms? Most polyps do not cause symptoms. If you have symptoms, they may include:  Blood coming from your rectum when having a bowel movement.  Blood in your stool. The stool may look dark red or black.  Abdominal pain.  A change in bowel habits, such as constipation or diarrhea. How is this diagnosed? This condition is diagnosed with a colonoscopy. This is a procedure in which a lighted, flexible scope is inserted into the anus and then passed into the colon to examine the area. Polyps are sometimes found when a colonoscopy is done as part of routine cancer screening tests. How is this treated? Treatment for this condition involves removing any polyps that are found. Most polyps can be removed during a colonoscopy. Those polyps will then be tested for cancer. Additional treatment may be needed depending on the results of testing. Follow these instructions at home: Lifestyle  Maintain a healthy weight, or lose weight if recommended by your health care provider.  Exercise every day or as told by your health care provider.  Do not use any products that contain nicotine or tobacco, such as cigarettes and e-cigarettes. If you need help quitting, ask your health care provider.  If you drink alcohol, limit how much you have: ? 0-1 drink a day for women. ? 0-2 drinks a day for men.  Be aware of how much alcohol is in your drink. In the [place] one drink equals one 12 oz bottle of beer (355 mL), one 5 oz glass of wine (148 mL), or one 1 oz shot of hard liquor (44 mL). Eating and drinking   Eat foods that are high in fiber, such as fruits, vegetables, and whole grains.  Eat foods that are high in calcium and vitamin D, such as milk, cheese, yogurt, eggs, liver, fish, and broccoli.  Limit foods that are high in fat, such as fried foods and desserts.  Limit the amount of red meat and processed meat you eat, such as  hot dogs, sausage, bacon, and lunch meats. General instructions  Keep all follow-up visits as told by your health care provider. This is important. ? This includes having regularly scheduled colonoscopies. ? Talk to your health care provider about when you need a colonoscopy. Contact a health care provider if:  You have new or worsening bleeding during a bowel movement.  You have new or increased blood in your stool.  You have a change in bowel habits.  You lose weight for no known reason. Summary  Polyps are tissue growths inside the body. Polyps can grow in many places, including the colon.  Most colon polyps are noncancerous (benign), but some can become cancerous over time.  This condition is diagnosed with a colonoscopy.  Treatment for this condition involves removing any polyps that are found. Most polyps can be removed during a colonoscopy. This information  is not intended to replace advice given to you by your health care provider. Make sure you discuss any questions you have with your health care provider. Document Released: 08/21/2004 Document Revised: 03/12/2018 Document Reviewed: 03/12/2018 Elsevier Patient Education  2020 [employer].

## 2019-08-26 ENCOUNTER — Other Ambulatory Visit: Payer: Self-pay | Admitting: Family Medicine

## 2019-08-26 LAB — SURGICAL PATHOLOGY

## 2019-08-26 NOTE — Telephone Encounter (Signed)
Spoke with patient and informed her that it is too early for a refill. Please see telephone call from yesterday. Patient verbalized understanding.

## 2019-08-26 NOTE — Telephone Encounter (Signed)
Patient needs refill on hydrocodone  °[facility] [place]  °

## 2019-08-30 ENCOUNTER — Encounter (HOSPITAL_COMMUNITY): Payer: Self-pay | Admitting: Internal Medicine

## 2019-09-02 NOTE — Telephone Encounter (Signed)
Patient requesting refill on hydrocodone [facility].  CB# [phone]

## 2019-09-02 NOTE — Telephone Encounter (Signed)
Ok to refill??  Last office visit 06/09/2019.  Last refill 08/03/2019.

## 2019-09-03 MED ORDER — HYDROCODONE-ACETAMINOPHEN 5-325 MG PO TABS: 1.0000 | ORAL_TABLET | Freq: Four times a day (QID) | ORAL | 0 refills | Status: DC | PRN

## 2019-09-15 ENCOUNTER — Ambulatory Visit: Payer: Medicare Other | Admitting: Family Medicine

## 2019-10-04 ENCOUNTER — Other Ambulatory Visit: Payer: Self-pay | Admitting: Family Medicine

## 2019-10-04 MED ORDER — HYDROCODONE-ACETAMINOPHEN 5-325 MG PO TABS: 1.0000 | ORAL_TABLET | Freq: Four times a day (QID) | ORAL | 0 refills | Status: DC | PRN

## 2019-10-04 NOTE — Telephone Encounter (Signed)
Ok to refill??  Last office visit 06/09/2019.  Last refill 09/03/2019.

## 2019-10-04 NOTE — Telephone Encounter (Signed)
Patient called requesting her hydrocodone called into [facility] in [place].  CB# [phone]

## 2019-11-01 ENCOUNTER — Other Ambulatory Visit: Payer: Self-pay

## 2019-11-01 MED ORDER — HYDROCODONE-ACETAMINOPHEN 5-325 MG PO TABS: 1.0000 | ORAL_TABLET | Freq: Four times a day (QID) | ORAL | 0 refills | Status: DC | PRN

## 2019-11-01 NOTE — Telephone Encounter (Signed)
Requested Prescriptions   Pending Prescriptions Disp Refills  . HYDROcodone-acetaminophen (NORCO) 5-325 MG tablet 30 tablet 0    Sig: Take 1 tablet by mouth every 6 (six) hours as needed for moderate pain.    Last OV 06/09/2019   Last written 10/04/2019

## 2019-11-08 ENCOUNTER — Other Ambulatory Visit: Payer: Self-pay | Admitting: Family Medicine

## 2019-11-23 ENCOUNTER — Other Ambulatory Visit: Payer: Self-pay | Admitting: Family Medicine

## 2019-11-23 MED ORDER — HYDROCODONE-ACETAMINOPHEN 5-325 MG PO TABS: 1.0000 | ORAL_TABLET | Freq: Four times a day (QID) | ORAL | 0 refills | Status: DC | PRN

## 2019-11-23 NOTE — Telephone Encounter (Signed)
Ok to refill??  Last office visit 06/09/2019.  Last refill 11/01/2019.

## 2019-11-23 NOTE — Telephone Encounter (Signed)
Patient called requesting refill on her hydrocodone called into [facility] in [place].   CB# [phone]

## 2019-12-16 ENCOUNTER — Other Ambulatory Visit: Payer: Self-pay | Admitting: Family Medicine

## 2019-12-16 NOTE — Telephone Encounter (Signed)
Ok to refill??  Last office visit 06/09/2019.  Last refill 11/23/2019.

## 2019-12-16 NOTE — Telephone Encounter (Signed)
Patient is calling to get refill on her hydrocodone  [facility] [place]

## 2019-12-17 MED ORDER — HYDROCODONE-ACETAMINOPHEN 5-325 MG PO TABS: 1.0000 | ORAL_TABLET | Freq: Four times a day (QID) | ORAL | 0 refills | Status: DC | PRN

## 2020-01-07 ENCOUNTER — Other Ambulatory Visit (HOSPITAL_COMMUNITY): Payer: Self-pay | Admitting: Family Medicine

## 2020-01-07 DIAGNOSIS — Z1231 Encounter for screening mammogram for malignant neoplasm of breast: Secondary | ICD-10-CM

## 2020-01-11 ENCOUNTER — Other Ambulatory Visit: Payer: Self-pay | Admitting: Family Medicine

## 2020-01-11 MED ORDER — HYDROCODONE-ACETAMINOPHEN 5-325 MG PO TABS: 1.0000 | ORAL_TABLET | Freq: Four times a day (QID) | ORAL | 0 refills | Status: DC | PRN

## 2020-01-11 NOTE — Telephone Encounter (Signed)
Ok to refill??  Last office visit 06/09/2019.  Last refill 12/17/2019.

## 2020-01-11 NOTE — Telephone Encounter (Signed)
Patient would like refill on hydrocodone [facility] [place]

## 2020-02-01 ENCOUNTER — Other Ambulatory Visit: Payer: Self-pay | Admitting: Family Medicine

## 2020-02-01 MED ORDER — HYDROCODONE-ACETAMINOPHEN 5-325 MG PO TABS: 1.0000 | ORAL_TABLET | Freq: Four times a day (QID) | ORAL | 0 refills | Status: DC | PRN

## 2020-02-01 NOTE — Telephone Encounter (Signed)
Patient needs refill on hydrocodone [facility] Kathy Barr

## 2020-02-01 NOTE — Telephone Encounter (Signed)
Ok to refill??  Last office visit 06/09/2019.  Last refill 01/11/2020.

## 2020-02-17 ENCOUNTER — Other Ambulatory Visit: Payer: Self-pay

## 2020-02-17 ENCOUNTER — Ambulatory Visit (HOSPITAL_COMMUNITY)
Admission: RE | Admit: 2020-02-17 | Discharge: 2020-02-17 | Disposition: A | Discharge status: Home / Self Care | Payer: Medicare Other | Source: Ambulatory Visit | Attending: Family Medicine | Admitting: Family Medicine

## 2020-02-17 DIAGNOSIS — Z1231 Encounter for screening mammogram for malignant neoplasm of breast: Secondary | ICD-10-CM | POA: Diagnosis not present

## 2020-02-23 ENCOUNTER — Other Ambulatory Visit: Payer: Self-pay | Admitting: Family Medicine

## 2020-02-23 MED ORDER — HYDROCODONE-ACETAMINOPHEN 5-325 MG PO TABS: 1.0000 | ORAL_TABLET | Freq: Four times a day (QID) | ORAL | 0 refills | Status: DC | PRN

## 2020-02-23 NOTE — Telephone Encounter (Signed)
Patient needs refill on hydrocodone [facility] [place]

## 2020-02-23 NOTE — Telephone Encounter (Signed)
Ok to refill??  Last office visit 06/09/2019.  Last refill 02/01/2020.

## 2020-03-18 ENCOUNTER — Other Ambulatory Visit: Payer: Self-pay | Admitting: Family Medicine

## 2020-03-22 ENCOUNTER — Other Ambulatory Visit: Payer: Self-pay | Admitting: *Deleted

## 2020-03-22 MED ORDER — LOSARTAN POTASSIUM 100 MG PO TABS: 100.0000 mg | ORAL_TABLET | Freq: Every day | ORAL | 1 refills | Status: DC

## 2020-03-22 MED ORDER — HYDROCODONE-ACETAMINOPHEN 5-325 MG PO TABS: 1.0000 | ORAL_TABLET | Freq: Four times a day (QID) | ORAL | 0 refills | Status: DC | PRN

## 2020-03-22 NOTE — Telephone Encounter (Signed)
Received call from patient.   Requested refill on Hydrocodone/APAP and Losartan.   Ok to refill Hydrocodone/APAP??  Last office visit 06/09/2019.  Last refill 02/23/2020.

## 2020-04-12 ENCOUNTER — Telehealth: Payer: Self-pay | Admitting: Family Medicine

## 2020-04-12 NOTE — Chronic Care Management (AMB) (Signed)
  Chronic Care Management   Note  04/12/2020 Name: Kathy Barr MRN: [id number] DOB: [birth year]  Kathy Barr is a 64 y.o. year old female who is a primary care patient of [place], Modena Nunnery, MD. I reached out to Kathy Barr by phone today in response to a referral sent by Kathy Barr PCP, Alycia Rossetti, MD.   Kathy Barr was given information about Chronic Care Management services today including:  1. CCM service includes personalized support from designated clinical staff supervised by her physician, including individualized plan of care and coordination with other care providers 2. 24/7 contact phone numbers for assistance for urgent and routine care needs. 3. Service will only be billed when office clinical staff spend 20 minutes or more in a month to coordinate care. 4. Only one practitioner may furnish and bill the service in a calendar month. 5. The patient may stop CCM services at any time (effective at the end of the month) by phone call to the office staff.   Patient agreed to services and verbal consent obtained.   Follow up plan:   [place]

## 2020-04-18 ENCOUNTER — Other Ambulatory Visit: Payer: Self-pay | Admitting: Family Medicine

## 2020-04-18 MED ORDER — HYDROCODONE-ACETAMINOPHEN 5-325 MG PO TABS: 1.0000 | ORAL_TABLET | Freq: Four times a day (QID) | ORAL | 0 refills | Status: DC | PRN

## 2020-04-18 NOTE — Telephone Encounter (Signed)
CB# [phone] Refill  Hydrocodone  [place], [place] - [address]

## 2020-04-18 NOTE — Telephone Encounter (Signed)
Ok to refill??  Last office visit 06/09/2019.  Last refill 03/22/2020.

## 2020-05-05 ENCOUNTER — Telehealth: Payer: Medicare Other

## 2020-05-09 ENCOUNTER — Telehealth: Payer: Medicare Other

## 2020-05-15 ENCOUNTER — Other Ambulatory Visit: Payer: Self-pay | Admitting: *Deleted

## 2020-05-15 MED ORDER — HYDROCODONE-ACETAMINOPHEN 5-325 MG PO TABS: 1.0000 | ORAL_TABLET | Freq: Four times a day (QID) | ORAL | 0 refills | Status: DC | PRN

## 2020-05-15 NOTE — Telephone Encounter (Signed)
Received call from patient.   Requested refill on Hydrocodone/APAP  Ok to refill??  Last office visit 04/12/2020.  Last refill 04/18/2020.

## 2020-05-16 NOTE — Chronic Care Management (AMB) (Signed)
Chronic Care Management Pharmacy  Name: Kathy Barr  MRN: [id number] DOB: 07-Apr-1956  Chief Complaint/ HPI  Kathy Barr,  64 y.o. , female presents for their Initial CCM visit with the clinical pharmacist via telephone.  PCP : Alycia Rossetti, MD  Their chronic conditions include: hypertension, IBS, osteoarthritis of hip, hypertriglyceridemia.  Office Visits: 06/09/2019 [facility]) - dizziness, started meclizine bid with instructions to taper down to once daily  Consult Visit: 03/25/2019 [facility]) - Follow up post back surgery, having heartburn issues, determined to be well healed and no follow up needed unless symptoms worsen  Medications: Outpatient Encounter Medications as of 05/18/2020  Medication Sig  . acetaminophen (TYLENOL) 500 MG tablet Take 1,000 mg by mouth every 6 (six) hours as needed (pain).   Marland Kitchen albuterol (PROVENTIL HFA;VENTOLIN HFA) 108 (90 Base) MCG/ACT inhaler INHALE 2 PUFFS INTO LUNGS EVERY 6 HOURS AS NEEDED FOR WHEEZING OR SHORTNESS OF BREATH  . Calcium Carb-Cholecalciferol (CALCIUM 600+D3 PO) Take 1 tablet by mouth 2 (two) times daily.  . hydrochlorothiazide (HYDRODIURIL) 25 MG tablet Take 1 tablet by mouth once daily (Patient taking differently: Take 25 mg by mouth daily. )  . HYDROcodone-acetaminophen (NORCO) 5-325 MG tablet Take 1 tablet by mouth every 6 (six) hours as needed for moderate pain.  Marland Kitchen loratadine (CLARITIN) 10 MG tablet Take 10 mg by mouth daily.   Marland Kitchen losartan (COZAAR) 100 MG tablet Take 1 tablet (100 mg total) by mouth daily.  . prednisoLONE acetate (PRED FORTE) 1 % ophthalmic suspension Place 1 drop into both eyes 2 (two) times daily as needed (use 1-2 drops into ears as needed for itching.).  Marland Kitchen meclizine (ANTIVERT) 12.5 MG tablet TAKE 1 TABLET BY MOUTH THREE TIMES DAILY AS NEEDED FOR DIZZINESS (Patient not taking: Reported on 05/18/2020)   No facility-administered encounter medications on file as of 05/18/2020.     Current  Diagnosis/Assessment:  SDOH Interventions     Most Recent Value  SDOH Interventions  Financial Strain Interventions --  [discussed patient housing situation as she is currently being forced out of her home by her landlord with 30-60 days to find somewhere new.  Will assist her with this in any way possible.]      Goals Addressed            This Visit's Progress   . Pharmacy Care Plan:       CARE PLAN ENTRY  Current Barriers:  . Chronic Disease Management support, education, and care coordination needs related to Hypertension, Osteoarthritis, and hypertriglycerides.   Hypertension . Pharmacist Clinical Goal(s): o Over the next 180 days, patient will work with PharmD and providers to maintain BP goal <140/90 . Current regimen:  o Losartan 100mg  o HCTZ 25mg  . Interventions: o Comprehensive medication review o Recommended regular BP monitoring and recording . Patient self care activities - Over the next 180 days, patient will: o Check BP at least a few times per week, document, and provide at future appointments o Ensure daily salt intake < 2300 mg/day o Contact PharmD or PCP with elevated BP's > 140/90  Hypertriglyceridemia . Pharmacist Clinical Goal(s): o Over the next 180 days, patient will work with PharmD and providers to maintain TG goal < 150 . Current regimen:  o No medications currently . Interventions: o Discussed diet/exercise. . Patient self care activities - Over the next 180 days, patient will: o Watch fatty content of foods o Attend appointment with Dr. Buelah Manis for follow up and updated lab  work at next visit   Osteoarthritis . Pharmacist Clinical Goal(s) o Over the next 180 days, patient will work with PharmD and providers to optimize medications and minimize symptoms related to arthritis. . Current regimen:  o Hydrocodone 5/325mg  o Tylenol 500mg  tablets . Interventions: o Comprehensive medication review o Continue current therapy . Patient self care  activities - Over the next 180 days, patient will: o Continue to take medication as directed o Focus on medication adherence by pill count o Contact PharmD or PCP with any increase in symptoms or medication related questions/concerns.  Initial goal documentation        Hypertension   Office blood pressures are  BP Readings from Last 3 Encounters:  08/25/19 106/72  06/09/19 128/82  12/28/18 130/82    Patient has failed these meds in the past: valsartan-HCTZ 160-25mg ,   Patient checks BP at home infrequently, usually just when she checks her husbands BP  Patient home BP readings are ranging: no specific readings as she does not record, BP always normal in office and she reports the same at home  Patient is currently controlled on the following medications: losartan 100mg , HCTZ 25mg ,   No signs of hypotension, denies chest pain and dizziness.    Plan  Continue current medications      Hypertriglyceridemia   Lipid Panel     Component Value Date/Time   CHOL 168 12/28/2018 1019   TRIG 133 12/28/2018 1019   HDL 61 12/28/2018 1019   LDLCALC 84 12/28/2018 1019     The 10-year ASCVD risk score Mikey Bussing DC Jr., et al., 2013) is: 4.5%   Values used to calculate the score:     Age: 22 years     Sex: Female     Is Non-Hispanic African American: No     Diabetic: No     Tobacco smoker: No     Systolic Blood Pressure: 937 mmHg     Is BP treated: Yes     HDL Cholesterol: 61 mg/dL     Total Cholesterol: 168 mg/dL   Patient has failed these meds in past: none noted Patient is currently controlled on the following medications:  . None currently  Diet and exercise: Patient currently not doing specific exercise plan, just walking a lot and lifting while assisting her husband who is wheelchair bound.  Plan  Continue current medications, due for labs and has visit scheduled with Dr. Buelah Manis next week for follow up  Osteoarthritis    Patient has failed these meds in past:  celecoxib 200mg , etodolac 500mg ,  Patient is currently controlled on the following medications: Norco 5/325mg , Tylenol 500mg ,   Patient controlled currently, no concerns noted per patient.  Plan  Continue current medications   Vaccines   Reviewed and discussed patient's vaccination history.    Immunization History  Administered Date(s) Administered  . Influenza, Quadrivalent, Recombinant, Inj, Pf 09/12/2018  . Influenza,inj,Quad PF,6+ Mos 08/27/2017, 09/12/2018  . Influenza-Unspecified 10/17/2015, 09/05/2016, 08/23/2019  . Moderna SARS-COVID-2 Vaccination 02/21/2020, 03/20/2020  . Pneumococcal Polysaccharide-23 03/18/2013  . Td 05/03/1998  . Tdap 03/21/2011  . Zoster Recombinat (Shingrix) 09/12/2018, 01/13/2019    Plan  Recommended patient receive pneumovax 23 vaccine in office/pharmacy.   Medication Management   . Miscellaneous medications: Albuterol 12mcg inhaler, Prednisolone 1% ophthalmic susp . OTC's: Meclizine 12.5mg , loratadine 10mg , Calcium + Vit D . Patient currently uses [facility].  Phone #  [phone] . Patient reports using no specific method to organize medications and promote adherence. Marland Kitchen  Patient denies missed doses of medication.   Beverly Milch, PharmD Clinical Pharmacist [place] [phone]

## 2020-05-18 ENCOUNTER — Ambulatory Visit: Payer: Medicare Other | Admitting: Pharmacist

## 2020-05-18 ENCOUNTER — Other Ambulatory Visit: Payer: Self-pay | Admitting: Family Medicine

## 2020-05-18 ENCOUNTER — Other Ambulatory Visit: Payer: Self-pay

## 2020-05-18 DIAGNOSIS — I10 Essential (primary) hypertension: Secondary | ICD-10-CM

## 2020-05-18 DIAGNOSIS — E6609 Other obesity due to excess calories: Secondary | ICD-10-CM

## 2020-05-18 DIAGNOSIS — E781 Pure hyperglyceridemia: Secondary | ICD-10-CM

## 2020-05-18 DIAGNOSIS — M1611 Unilateral primary osteoarthritis, right hip: Secondary | ICD-10-CM

## 2020-05-18 NOTE — Patient Instructions (Addendum)
Visit Information Thank you for meeting with me today!  I look forward to working with you to help you meet all of your healthcare goals and answer any questions you may have.  Feel free to contact me anytime!  Goals Addressed            This Visit's Progress   . Pharmacy Care Plan:       CARE PLAN ENTRY  Current Barriers:  . Chronic Disease Management support, education, and care coordination needs related to Hypertension, Osteoarthritis, and hypertriglycerides.   Hypertension . Pharmacist Clinical Goal(s): o Over the next 180 days, patient will work with PharmD and providers to maintain BP goal <140/90 . Current regimen:  o Losartan 100mg  o HCTZ 25mg  . Interventions: o Comprehensive medication review o Recommended regular BP monitoring and recording . Patient self care activities - Over the next 180 days, patient will: o Check BP at least a few times per week, document, and provide at future appointments o Ensure daily salt intake < 2300 mg/day o Contact PharmD or PCP with elevated BP's > 140/90  Hypertriglyceridemia . Pharmacist Clinical Goal(s): o Over the next 180 days, patient will work with PharmD and providers to maintain TG goal < 150 . Current regimen:  o No medications currently . Interventions: o Discussed diet/exercise. . Patient self care activities - Over the next 180 days, patient will: o Watch fatty content of foods o Attend appointment with Dr. Buelah Manis for follow up and updated lab work at next visit   Osteoarthritis . Pharmacist Clinical Goal(s) o Over the next 180 days, patient will work with PharmD and providers to optimize medications and minimize symptoms related to arthritis. . Current regimen:  o Hydrocodone 5/325mg  o Tylenol 500mg  tablets . Interventions: o Comprehensive medication review o Continue current therapy . Patient self care activities - Over the next 180 days, patient will: o Continue to take medication as directed o Focus on  medication adherence by pill count o Contact PharmD or PCP with any increase in symptoms or medication related questions/concerns.  Initial goal documentation        Kathy Barr was given information about Chronic Care Management services today including:  1. CCM service includes personalized support from designated clinical staff supervised by her physician, including individualized plan of care and coordination with other care providers 2. 24/7 contact phone numbers for assistance for urgent and routine care needs. 3. Standard insurance, coinsurance, copays and deductibles apply for chronic care management only during months in which we provide at least 20 minutes of these services. Most insurances cover these services at 100%, however patients may be responsible for any copay, coinsurance and/or deductible if applicable. This service may help you avoid the need for more expensive face-to-face services. 4. Only one practitioner may furnish and bill the service in a calendar month. 5. The patient may stop CCM services at any time (effective at the end of the month) by phone call to the office staff.  Patient agreed to services and verbal consent obtained.   The patient verbalized understanding of instructions provided today and agreed to receive a mailed copy of patient instruction and/or educational materials. Telephone follow up appointment with pharmacy team member scheduled for: 11/22/2020 @ 4:30 PM Beverly Milch, PharmD Clinical Pharmacist [place] [phone]   Osteoarthritis  Osteoarthritis is a type of arthritis that affects tissue that covers the ends of bones in joints (cartilage). Cartilage acts as a cushion between the bones and  helps them move smoothly. Osteoarthritis results when cartilage in the joints gets worn down. Osteoarthritis is sometimes called "wear and tear" arthritis. Osteoarthritis is the most common form of arthritis. It often occurs in  older people. It is a condition that gets worse over time (a progressive condition). Joints that are most often affected by this condition are in:  Fingers.  Toes.  Hips.  Knees.  Spine, including neck and lower back. What are the causes? This condition is caused by age-related wearing down of cartilage that covers the ends of bones. What increases the risk? The following factors may make you more likely to develop this condition:  Older age.  Being overweight or obese.  Overuse of joints, such as in athletes.  Past injury of a joint.  Past surgery on a joint.  Family history of osteoarthritis. What are the signs or symptoms? The main symptoms of this condition are pain, swelling, and stiffness in the joint. The joint may lose its shape over time. Small pieces of bone or cartilage may break off and float inside of the joint, which may cause more pain and damage to the joint. Small deposits of bone (osteophytes) may grow on the edges of the joint. Other symptoms may include:  A grating or scraping feeling inside the joint when you move it.  Popping or creaking sounds when you move. Symptoms may affect one or more joints. Osteoarthritis in a major joint, such as your knee or hip, can make it painful to walk or exercise. If you have osteoarthritis in your hands, you might not be able to grip items, twist your hand, or control small movements of your hands and fingers (fine motor skills). How is this diagnosed? This condition may be diagnosed based on:  Your medical history.  A physical exam.  Your symptoms.  X-rays of the affected joint(s).  Blood tests to rule out other types of arthritis. How is this treated? There is no cure for this condition, but treatment can help to control pain and improve joint function. Treatment plans may include:  A prescribed exercise program that allows for rest and joint relief. You may work with a physical therapist.  A weight control  plan.  Pain relief techniques, such as: ? Applying heat and cold to the joint. ? Electric pulses delivered to nerve endings under the skin (transcutaneous electrical nerve stimulation, or TENS). ? Massage. ? Certain nutritional supplements.  NSAIDs or prescription medicines to help relieve pain.  Medicine to help relieve pain and inflammation (corticosteroids). This can be given by mouth (orally) or as an injection.  Assistive devices, such as a brace, wrap, splint, specialized glove, or cane.  Surgery, such as: ? An osteotomy. This is done to reposition the bones and relieve pain or to remove loose pieces of bone and cartilage. ? Joint replacement surgery. You may need this surgery if you have very bad (advanced) osteoarthritis. Follow these instructions at home: Activity  Rest your affected joints as directed by your health care provider.  Do not drive or use heavy machinery while taking prescription pain medicine.  Exercise as directed. Your health care provider or physical therapist may recommend specific types of exercise, such as: ? Strengthening exercises. These are done to strengthen the muscles that support joints that are affected by arthritis. They can be performed with weights or with exercise bands to add resistance. ? Aerobic activities. These are exercises, such as brisk walking or water aerobics, that get your heart pumping. ?  Range-of-motion activities. These keep your joints easy to move. ? Balance and agility exercises. Managing pain, stiffness, and swelling      If directed, apply heat to the affected area as often as told by your health care provider. Use the heat source that your health care provider recommends, such as a moist heat pack or a heating pad. ? If you have a removable assistive device, remove it as told by your health care provider. ? Place a towel between your skin and the heat source. If your health care provider tells you to keep the assistive  device on while you apply heat, place a towel between the assistive device and the heat source. ? Leave the heat on for 20-30 minutes. ? Remove the heat if your skin turns bright red. This is especially important if you are unable to feel pain, heat, or cold. You may have a greater risk of getting burned.  If directed, put ice on the affected joint: ? If you have a removable assistive device, remove it as told by your health care provider. ? Put ice in a plastic bag. ? Place a towel between your skin and the bag. If your health care provider tells you to keep the assistive device on during icing, place a towel between the assistive device and the bag. ? Leave the ice on for 20 minutes, 2-3 times a day. General instructions  Take over-the-counter and prescription medicines only as told by your health care provider.  Maintain a healthy weight. Follow instructions from your health care provider for weight control. These may include dietary restrictions.  Do not use any products that contain nicotine or tobacco, such as cigarettes and e-cigarettes. These can delay bone healing. If you need help quitting, ask your health care provider.  Use assistive devices as directed by your health care provider.  Keep all follow-up visits as told by your health care provider. This is important. Where to find more information  [employer] of Arthritis and Musculoskeletal and Skin Diseases: www.niams.SouthExposed.es  [employer] on Aging: http://kim-miller.com/  American College of Rheumatology: www.rheumatology.org Contact a health care provider if:  Your skin turns red.  You develop a rash.  You have pain that gets worse.  You have a fever along with joint or muscle aches. Get help right away if:  You lose a lot of weight.  You suddenly lose your appetite.  You have night sweats. Summary  Osteoarthritis is a type of arthritis that affects tissue covering the ends of bones in joints  (cartilage).  This condition is caused by age-related wearing down of cartilage that covers the ends of bones.  The main symptom of this condition is pain, swelling, and stiffness in the joint.  There is no cure for this condition, but treatment can help to control pain and improve joint function. This information is not intended to replace advice given to you by your health care provider. Make sure you discuss any questions you have with your health care provider. Document Revised: 11/07/2017 Document Reviewed: 07/29/2016 Elsevier Patient Education  2020 [employer].

## 2020-05-29 ENCOUNTER — Ambulatory Visit: Payer: Medicare Other | Admitting: Family Medicine

## 2020-06-07 ENCOUNTER — Ambulatory Visit: Payer: Self-pay | Admitting: Pharmacist

## 2020-06-07 NOTE — Chronic Care Management (AMB) (Signed)
  Chronic Care Management   Follow Up Note   06/07/2020 Name: Kathy Barr MRN: [id number] DOB: 1956/05/28  Referred by: Alycia Rossetti, MD Reason for referral : No chief complaint on file.   Kathy Barr is a 64 y.o. year old female who is a primary care patient of [place], Modena Nunnery, MD. The CCM team was consulted for assistance with chronic disease management and care coordination needs.    Review of patient status, including review of consultants reports, relevant laboratory and other test results, and collaboration with appropriate care team members and the patient's provider was performed as part of comprehensive patient evaluation and provision of chronic care management services.    SDOH (Social Determinants of Health) assessments performed: No See Care Plan activities for detailed interventions related to [facility])     Outpatient Encounter Medications as of 06/07/2020  Medication Sig  . acetaminophen (TYLENOL) 500 MG tablet Take 1,000 mg by mouth every 6 (six) hours as needed (pain).   Marland Kitchen albuterol (PROVENTIL HFA;VENTOLIN HFA) 108 (90 Base) MCG/ACT inhaler INHALE 2 PUFFS INTO LUNGS EVERY 6 HOURS AS NEEDED FOR WHEEZING OR SHORTNESS OF BREATH  . Calcium Carb-Cholecalciferol (CALCIUM 600+D3 PO) Take 1 tablet by mouth 2 (two) times daily.  . hydrochlorothiazide (HYDRODIURIL) 25 MG tablet Take 1 tablet by mouth once daily (Patient taking differently: Take 25 mg by mouth daily. )  . HYDROcodone-acetaminophen (NORCO) 5-325 MG tablet Take 1 tablet by mouth every 6 (six) hours as needed for moderate pain.  Marland Kitchen loratadine (CLARITIN) 10 MG tablet Take 10 mg by mouth daily.   Marland Kitchen losartan (COZAAR) 100 MG tablet Take 1 tablet (100 mg total) by mouth daily.  . meclizine (ANTIVERT) 12.5 MG tablet TAKE 1 TABLET BY MOUTH THREE TIMES DAILY AS NEEDED FOR DIZZINESS (Patient not taking: Reported on 05/18/2020)  . prednisoLONE acetate (PRED FORTE) 1 % ophthalmic suspension Place 1 drop into both eyes 2 (two)  times daily as needed (use 1-2 drops into ears as needed for itching.).   No facility-administered encounter medications on file as of 06/07/2020.     Objective:  Utilize upstream bridge to analyze patient adherence and other star measures.  Proportion of days covered is 0.97 with 7 days missed - patient shows great adherence to medication. Plan:   The patient has been provided with contact information for the care management team and has been advised to call with any health related questions or concerns.    Beverly Milch, PharmD Clinical Pharmacist [place] [phone]

## 2020-06-13 ENCOUNTER — Other Ambulatory Visit: Payer: Self-pay | Admitting: *Deleted

## 2020-06-13 MED ORDER — HYDROCODONE-ACETAMINOPHEN 5-325 MG PO TABS: 1.0000 | ORAL_TABLET | Freq: Four times a day (QID) | ORAL | 0 refills | Status: DC | PRN

## 2020-06-13 NOTE — Telephone Encounter (Signed)
Received call from patient.   Requested refill on Hydrocodone/APAP.   Ok to refill??  Last office visit 05/18/2020.  Last refill 05/15/2020.

## 2020-06-19 ENCOUNTER — Other Ambulatory Visit: Payer: Self-pay

## 2020-06-19 ENCOUNTER — Ambulatory Visit (INDEPENDENT_AMBULATORY_CARE_PROVIDER_SITE_OTHER): Payer: Medicare Other | Admitting: Family Medicine

## 2020-06-19 ENCOUNTER — Encounter: Payer: Self-pay | Admitting: Family Medicine

## 2020-06-19 VITALS — BP 122/64 | HR 86 | Temp 98.1°F | Resp 14 | Ht 64.0 in | Wt 179.0 lb

## 2020-06-19 DIAGNOSIS — I10 Essential (primary) hypertension: Secondary | ICD-10-CM

## 2020-06-19 DIAGNOSIS — E781 Pure hyperglyceridemia: Secondary | ICD-10-CM

## 2020-06-19 DIAGNOSIS — M5136 Other intervertebral disc degeneration, lumbar region: Secondary | ICD-10-CM | POA: Diagnosis not present

## 2020-06-19 DIAGNOSIS — Z23 Encounter for immunization: Secondary | ICD-10-CM | POA: Diagnosis not present

## 2020-06-19 DIAGNOSIS — R6889 Other general symptoms and signs: Secondary | ICD-10-CM | POA: Diagnosis not present

## 2020-06-19 DIAGNOSIS — G6289 Other specified polyneuropathies: Secondary | ICD-10-CM

## 2020-06-19 DIAGNOSIS — M159 Polyosteoarthritis, unspecified: Secondary | ICD-10-CM

## 2020-06-19 DIAGNOSIS — Z6830 Body mass index (BMI) 30.0-30.9, adult: Secondary | ICD-10-CM

## 2020-06-19 DIAGNOSIS — E6609 Other obesity due to excess calories: Secondary | ICD-10-CM

## 2020-06-19 DIAGNOSIS — R7309 Other abnormal glucose: Secondary | ICD-10-CM | POA: Diagnosis not present

## 2020-06-19 NOTE — Patient Instructions (Signed)
F/U December for Physical

## 2020-06-19 NOTE — Assessment & Plan Note (Signed)
Blood pressure controlled.  Will obtain fasting labs.  She has lost weight but most she states that been distress.  She has been caring for her husband who has been quite ill in and out of the hospital over the past year.  She does state that she eats regularly.  Has a good appetite.  I am concerned with the neuropathy symptoms are diabetes mellitus as discussed during her family.  We will also check a B12 level.  Another differential for the neuropathy is pinched nerve due to her known disc disease.

## 2020-06-19 NOTE — Progress Notes (Signed)
Subjective:    Patient ID: Kathy Barr, female    DOB: May 26, 1956, 64 y.o.   MRN: [id number]  Patient presents for Follow-up (is fasting)   Pt here to f/u chronic medical problems  medications reviewed   Chronic pain/OA generlized and DDDlumbar spine- taking norco prn   she has burning in her feet that were worse in the winter, at times felt like her feet are frozen at times but they are warm to touch, has tingling sensation as well, this has been going on for months She has spot on right foot that became infected it has yellow pus and she popped it with a pin aefw months ago, it has cleared up, now using a prn callus cream   HTN- taking HCTZ and losartan as prescribed  Weight down11lbs since Sept 2020  Hypertriglyceridemia- controlling with diet   Dinks 4 soda a day   Review Of Systems:  GEN- denies fatigue, fever, weight loss,weakness, recent illness HEENT- denies eye drainage, change in vision, nasal discharge, CVS- denies chest pain, palpitations RESP- denies SOB, cough, wheeze ABD- denies N/V, change in stools, abd pain GU- denies dysuria, hematuria, dribbling, incontinence MSK- + joint pain, muscle aches, injury Neuro- denies headache, dizziness, syncope, seizure activity       Objective:    BP 122/64   Pulse 86   Temp 98.1 F (36.7 C) (Temporal)   Resp 14   Ht 5\' 4"  (1.626 m)   Wt 179 lb (81.2 kg)   SpO2 97%   BMI 30.73 kg/m  GEN- NAD, alert and oriented x3 HEENT- PERRL, EOMI, non injected sclera, pink conjunctiva, MMM, oropharynx clear Neck- Supple, no thyromegaly CVS- RRR, no murmur RESP-CTAB ABD-NABS,soft,NT,ND NEURO-CNI-XII in tact, decreased monofilament bilat, mild callus bilat EXT- No edema Pulses- Radial, DP- 2+        Assessment & Plan:      Problem List Items Addressed This Visit      Unprioritized   DDD (degenerative disc disease), lumbar   Essential hypertension, benign - Primary    Blood pressure controlled.  Will obtain  fasting labs.  She has lost weight but most she states that been distress.  She has been caring for her husband who has been quite ill in and out of the hospital over the past year.  She does state that she eats regularly.  Has a good appetite.  I am concerned with the neuropathy symptoms are diabetes mellitus as discussed during her family.  We will also check a B12 level.  Another differential for the neuropathy is pinched nerve due to her known disc disease.      Relevant Orders   CBC with Differential/Platelet   Comprehensive metabolic panel   Lipid panel   Generalized OA    Continue as needed pain medication.      Hypertriglyceridemia   Relevant Orders   Comprehensive metabolic panel   Obesity   Relevant Orders   Hemoglobin A1c    Other Visit Diagnoses    Other polyneuropathy       Note patient has tried gabapentin in the past states it did not work.  We will consider Lyrica if there are no other causes found   Relevant Orders   Hemoglobin A1c   Vitamin B12   Need for prophylactic vaccination against Streptococcus pneumoniae (pneumococcus)       Relevant Orders   Pneumococcal polysaccharide vaccine 23-valent greater than or equal to 2yo subcutaneous/IM (Completed)  Note: This dictation was prepared with Dragon dictation along with smaller phrase technology. Any transcriptional errors that result from this process are unintentional.

## 2020-06-19 NOTE — Assessment & Plan Note (Signed)
Continue as needed pain medication.

## 2020-06-20 LAB — HEMOGLOBIN A1C
Hgb A1c MFr Bld: 5.1 % of total Hgb (ref <5.7)
Mean Plasma Glucose: 100 (calc)
eAG (mmol/L): 5.5 (calc)

## 2020-06-20 LAB — CBC WITH DIFFERENTIAL/PLATELET
Absolute Monocytes: 475 cells/uL (ref 200–950)
Basophils Absolute: 33 cells/uL (ref 0–200)
Basophils Relative: 0.5 %
Eosinophils Absolute: 111 cells/uL (ref 15–500)
Eosinophils Relative: 1.7 %
HCT: 42.7 % (ref 35.0–45.0)
Hemoglobin: 14.2 g/dL (ref 11.7–15.5)
Lymphs Abs: 1606 cells/uL (ref 850–3900)
MCH: 27.7 pg (ref 27.0–33.0)
MCHC: 33.3 g/dL (ref 32.0–36.0)
MCV: 83.4 fL (ref 80.0–100.0)
MPV: 9.8 fL (ref 7.5–12.5)
Monocytes Relative: 7.3 %
Neutro Abs: 4277 cells/uL (ref 1500–7800)
Neutrophils Relative %: 65.8 %
Platelets: 241 10*3/uL (ref 140–400)
RBC: 5.12 10*6/uL — ABNORMAL HIGH (ref 3.80–5.10)
RDW: 13.2 % (ref 11.0–15.0)
Total Lymphocyte: 24.7 %
WBC: 6.5 10*3/uL (ref 3.8–10.8)

## 2020-06-20 LAB — COMPREHENSIVE METABOLIC PANEL
AG Ratio: 1.3 (calc) (ref 1.0–2.5)
ALT: 10 U/L (ref 6–29)
AST: 16 U/L (ref 10–35)
Albumin: 4.1 g/dL (ref 3.6–5.1)
Alkaline phosphatase (APISO): 88 U/L (ref 37–153)
BUN: 8 mg/dL (ref 7–25)
CO2: 31 mmol/L (ref 20–32)
Calcium: 9.8 mg/dL (ref 8.6–10.4)
Chloride: 98 mmol/L (ref 98–110)
Creat: 0.67 mg/dL (ref 0.50–0.99)
Globulin: 3.2 g/dL (calc) (ref 1.9–3.7)
Glucose, Bld: 95 mg/dL (ref 65–99)
Potassium: 3.8 mmol/L (ref 3.5–5.3)
Sodium: 137 mmol/L (ref 135–146)
Total Bilirubin: 1.2 mg/dL (ref 0.2–1.2)
Total Protein: 7.3 g/dL (ref 6.1–8.1)

## 2020-06-20 LAB — LIPID PANEL
Cholesterol: 173 mg/dL (ref <200)
HDL: 55 mg/dL (ref >=50)
LDL Cholesterol (Calc): 95 mg/dL (calc)
Non-HDL Cholesterol (Calc): 118 mg/dL (calc) (ref <130)
Total CHOL/HDL Ratio: 3.1 (calc) (ref <5.0)
Triglycerides: 135 mg/dL (ref <150)

## 2020-06-20 LAB — VITAMIN B12: Vitamin B-12: 285 pg/mL (ref 200–1100)

## 2020-06-22 ENCOUNTER — Other Ambulatory Visit: Payer: Self-pay | Admitting: *Deleted

## 2020-06-22 MED ORDER — CYANOCOBALAMIN 1000 MCG/ML IJ SOLN: 1000.0000 ug | INTRAMUSCULAR | 2 refills | Status: AC

## 2020-07-05 ENCOUNTER — Other Ambulatory Visit: Payer: Self-pay | Admitting: Family Medicine

## 2020-07-05 MED ORDER — HYDROCODONE-ACETAMINOPHEN 5-325 MG PO TABS: 1.0000 | ORAL_TABLET | Freq: Four times a day (QID) | ORAL | 0 refills | Status: DC | PRN

## 2020-07-05 NOTE — Telephone Encounter (Signed)
Ok to refill??  Last office visit 06/19/2020.  Last refill 06/13/2020.

## 2020-07-05 NOTE — Telephone Encounter (Signed)
CB# [phone] refill hydrocodone

## 2020-07-13 ENCOUNTER — Other Ambulatory Visit: Payer: Self-pay | Admitting: Family Medicine

## 2020-08-04 ENCOUNTER — Other Ambulatory Visit: Payer: Self-pay | Admitting: *Deleted

## 2020-08-04 MED ORDER — HYDROCODONE-ACETAMINOPHEN 5-325 MG PO TABS: 1.0000 | ORAL_TABLET | Freq: Four times a day (QID) | ORAL | 0 refills | Status: DC | PRN

## 2020-08-04 NOTE — Telephone Encounter (Signed)
Received call from patient.   Requested refill on Hydrocodone/APAP.  Ok to refill??  Last office visit 06/19/2020.  Last refill 07/05/2020.

## 2020-09-04 ENCOUNTER — Telehealth: Payer: Self-pay | Admitting: *Deleted

## 2020-09-04 MED ORDER — HYDROCODONE-ACETAMINOPHEN 5-325 MG PO TABS: 1.0000 | ORAL_TABLET | Freq: Four times a day (QID) | ORAL | 0 refills | Status: DC | PRN

## 2020-09-04 NOTE — Telephone Encounter (Signed)
Ok to refill??  Last office visit 06/19/2020.  Last refill 08/04/2020.

## 2020-09-29 ENCOUNTER — Other Ambulatory Visit: Payer: Self-pay | Admitting: Family Medicine

## 2020-09-29 MED ORDER — HYDROCODONE-ACETAMINOPHEN 5-325 MG PO TABS: 1.0000 | ORAL_TABLET | Freq: Four times a day (QID) | ORAL | 0 refills | Status: DC | PRN

## 2020-09-29 NOTE — Telephone Encounter (Signed)
Refill Hydrocodone  [facility], [place] - [address]

## 2020-09-29 NOTE — Telephone Encounter (Signed)
Med refilled.

## 2020-10-23 ENCOUNTER — Other Ambulatory Visit: Payer: Self-pay | Admitting: *Deleted

## 2020-10-23 MED ORDER — HYDROCODONE-ACETAMINOPHEN 5-325 MG PO TABS: 1.0000 | ORAL_TABLET | Freq: Four times a day (QID) | ORAL | 0 refills | Status: DC | PRN

## 2020-10-23 NOTE — Telephone Encounter (Signed)
Received call from patient.   Requested refill on Hydrocodone/APAP.   Ok to refill??  Last office visit 06/19/2020.  Last refill 09/29/2020.

## 2020-11-08 ENCOUNTER — Ambulatory Visit: Payer: Self-pay | Admitting: Pharmacist

## 2020-11-08 NOTE — Chronic Care Management (AMB) (Signed)
Chronic Care Management   Follow Up Note   11/09/2020 Name: Kathy Barr MRN: [id number] DOB: 04-13-1956  Referred by: Alycia Rossetti, MD Reason for referral : No chief complaint on file.   Kathy Barr is a 64 y.o. year old female who is a primary care patient of Crawford, Modena Nunnery, MD. The CCM team was consulted for assistance with chronic disease management and care coordination needs.    Review of patient status, including review of consultants reports, relevant laboratory and other test results, and collaboration with appropriate care team members and the patient's provider was performed as part of comprehensive patient evaluation and provision of chronic care management services.    SDOH (Social Determinants of Health) assessments performed: No See Care Plan activities for detailed interventions related to [facility])     Outpatient Encounter Medications as of 11/08/2020  Medication Sig  . acetaminophen (TYLENOL) 500 MG tablet Take 1,000 mg by mouth every 6 (six) hours as needed (pain).   Marland Kitchen albuterol (PROVENTIL HFA;VENTOLIN HFA) 108 (90 Base) MCG/ACT inhaler INHALE 2 PUFFS INTO LUNGS EVERY 6 HOURS AS NEEDED FOR WHEEZING OR SHORTNESS OF BREATH  . Calcium Carb-Cholecalciferol (CALCIUM 600+D3 PO) Take 1 tablet by mouth 2 (two) times daily. (Patient not taking: Reported on 06/19/2020)  . hydrochlorothiazide (HYDRODIURIL) 25 MG tablet Take 1 tablet by mouth once daily  . HYDROcodone-acetaminophen (NORCO) 5-325 MG tablet Take 1 tablet by mouth every 6 (six) hours as needed for moderate pain.  Marland Kitchen loratadine (CLARITIN) 10 MG tablet Take 10 mg by mouth daily.   Marland Kitchen losartan (COZAAR) 100 MG tablet Take 1 tablet (100 mg total) by mouth daily.   No facility-administered encounter medications on file as of 11/08/2020.    Reviewed chart prior to disease state call. Spoke with patient regarding BP  OV 06/19/2020 - Patient has lost 11 lbs since Sept 2020, she drinks 4 sodas per day, her B12 was low  and started injections to see if this helps.  Recent Office Vitals: BP Readings from Last 3 Encounters:  06/19/20 122/64  08/25/19 106/72  06/09/19 128/82   Pulse Readings from Last 3 Encounters:  06/19/20 86  08/25/19 78  06/09/19 90    Wt Readings from Last 3 Encounters:  06/19/20 179 lb (81.2 kg)  08/25/19 190 lb (86.2 kg)  06/09/19 199 lb (90.3 kg)     Kidney Function Lab Results  Component Value Date/Time   CREATININE 0.67 06/19/2020 12:31 PM   CREATININE 0.75 12/28/2018 10:19 AM   GFRNONAA >60 04/10/2018 01:13 PM   GFRAA >60 04/10/2018 01:13 PM    BMP Latest Ref Rng & Units 06/19/2020 12/28/2018 04/10/2018  Glucose 65 - 99 mg/dL 95 95 103(H)  BUN 7 - 25 mg/dL 8 11 6   Creatinine 0.50 - 0.99 mg/dL 0.67 0.75 0.73  BUN/Creat Ratio 6 - 22 (calc) NOT APPLICABLE NOT APPLICABLE -  Sodium 921 - 146 mmol/L 137 139 137  Potassium 3.5 - 5.3 mmol/L 3.8 3.7 3.3(L)  Chloride 98 - 110 mmol/L 98 102 100(L)  CO2 20 - 32 mmol/L 31 24 26   Calcium 8.6 - 10.4 mg/dL 9.8 9.9 9.4    . Current antihypertensive regimen:  o Losartan 100mg  daily o HCTZ 25mg  daily . How often are you checking your Blood Pressure? infrequently . Current home BP readings: 120s-130s/70-80s no specific logs but patient recalled from memory . What recent interventions/DTPs have been made by any provider to improve Blood Pressure control since last CPP Visit:  none . Any recent hospitalizations or ED visits since last visit with CPP? No . What diet changes have been made to improve Blood Pressure Control?  o None . What exercise is being done to improve your Blood Pressure Control?  o Patient reports she is active around the house and taking care of her husband, no specific exercise plan  Adherence Review: Is the patient currently on ACE/ARB medication? Yes - losartan 100mg  daily Does the patient have >5 day gap between last estimated fill dates? No - fills for 90 days supply to promote adherence  Patient  reports adherence with her BP medication and denies any adverse effects.  Has plenty of supply on hand.  Takes both medications in the morning   Discussed supplementation of B12 for neuropathy, patient received injections for 3 months and is not taking any B12 OTC.  Counseled her to f/u with Dr. Buelah Manis at upcoming visit to see if she should continue with OTC supplementation since she has completed her injections.  Patient agreeable to plan.  Reminded patient of follow up appointment with myself for complete med review on 12/15 @ 4:30 PM over the telephone.  Edythe Clarity, [facility]

## 2020-11-14 ENCOUNTER — Encounter: Payer: Medicare Other | Admitting: Family Medicine

## 2020-11-22 ENCOUNTER — Ambulatory Visit: Payer: Self-pay | Admitting: Pharmacist

## 2020-11-22 ENCOUNTER — Other Ambulatory Visit: Payer: Self-pay | Admitting: Family Medicine

## 2020-11-22 DIAGNOSIS — M1611 Unilateral primary osteoarthritis, right hip: Secondary | ICD-10-CM

## 2020-11-22 DIAGNOSIS — I10 Essential (primary) hypertension: Secondary | ICD-10-CM

## 2020-11-22 DIAGNOSIS — E781 Pure hyperglyceridemia: Secondary | ICD-10-CM

## 2020-11-22 MED ORDER — HYDROCODONE-ACETAMINOPHEN 5-325 MG PO TABS: 1.0000 | ORAL_TABLET | Freq: Four times a day (QID) | ORAL | 0 refills | Status: DC | PRN

## 2020-11-22 NOTE — Chronic Care Management (AMB) (Signed)
Chronic Care Management Pharmacy  Name: Kathy Barr  MRN: [id number] DOB: 1956/05/20  Chief Complaint/ HPI  Kathy Barr,  63 y.o. , female presents for their Follow-Up CCM visit with the clinical pharmacist via telephone.  PCP : Alycia Rossetti, MD  Their chronic conditions include: hypertension, osteoarthritis of hip, hypertriglyceridemia.  Office Visits: 06/09/2019 [facility]) - dizziness, started meclizine bid with instructions to taper down to once daily  Consult Visit: 03/25/2019 [facility], [facility]) - Follow up post back surgery, having heartburn issues, determined to be well healed and no follow up needed unless symptoms worsen  Medications: Outpatient Encounter Medications as of 11/22/2020  Medication Sig  . acetaminophen (TYLENOL) 500 MG tablet Take 1,000 mg by mouth every 6 (six) hours as needed (pain).   Marland Kitchen albuterol (PROVENTIL HFA;VENTOLIN HFA) 108 (90 Base) MCG/ACT inhaler INHALE 2 PUFFS INTO LUNGS EVERY 6 HOURS AS NEEDED FOR WHEEZING OR SHORTNESS OF BREATH  . Calcium Carb-Cholecalciferol (CALCIUM 600+D3 PO) Take 1 tablet by mouth 2 (two) times daily. (Patient not taking: Reported on 06/19/2020)  . hydrochlorothiazide (HYDRODIURIL) 25 MG tablet Take 1 tablet by mouth once daily  . HYDROcodone-acetaminophen (NORCO) 5-325 MG tablet Take 1 tablet by mouth every 6 (six) hours as needed for moderate pain.  Marland Kitchen loratadine (CLARITIN) 10 MG tablet Take 10 mg by mouth daily.   Marland Kitchen losartan (COZAAR) 100 MG tablet Take 1 tablet (100 mg total) by mouth daily.  . [DISCONTINUED] HYDROcodone-acetaminophen (NORCO) 5-325 MG tablet Take 1 tablet by mouth every 6 (six) hours as needed for moderate pain.   No facility-administered encounter medications on file as of 11/22/2020.     Current Diagnosis/Assessment:    Goals Addressed            This Visit's Progress   . Pharmacy Care Plan:       CARE PLAN ENTRY  Current Barriers:  . Chronic Disease Management support, education, and care  coordination needs related to Hypertension, Osteoarthritis, and hypertriglycerides.   Hypertension . Pharmacist Clinical Goal(s): o Over the next 120 days, patient will work with PharmD and providers to maintain BP goal <140/90 . Current regimen:  o Losartan 100mg  o HCTZ 25mg  . Interventions: o Comprehensive medication review o Recommended regular BP monitoring and recording . Patient self care activities - Over the next 120 days, patient will: o Check BP at least a few times per week, document, and provide at future appointments o Ensure daily salt intake < 2300 mg/day o Contact PharmD or PCP with elevated BP's > 140/90  Hypertriglyceridemia . Pharmacist Clinical Goal(s): o Over the next 120 days, patient will work with PharmD and providers to maintain TG goal < 150 . Current regimen:  o No medications currently . Interventions: o Discussed diet/exercise. . Patient self care activities - Over the next 120 days, patient will: o Watch fatty content of foods o Try to implement cardiovascular exercise if possible    Osteoarthritis . Pharmacist Clinical Goal(s) o Over the next 120 days, patient will work with PharmD and providers to optimize medications and minimize symptoms related to arthritis. . Current regimen:  o Hydrocodone 5/325mg  o Tylenol 500mg  tablets . Interventions: o Comprehensive medication review o Continue current therapy . Patient self care activities - Over the next 120 days, patient will: o Continue to take medication as directed o Focus on medication adherence by pill count o Contact PharmD or PCP with any increase in symptoms or medication related questions/concerns.  Please see past updates  related to this goal by clicking on the "Past Updates" button in the selected goal         Hypertension   Office blood pressures are  BP Readings from Last 3 Encounters:  06/19/20 122/64  08/25/19 106/72  06/09/19 128/82    Patient has failed these meds  in the past: valsartan-HCTZ 160-25mg ,   Patient checks BP at home infrequently, HTN call earlier this month and today she reports 120s-130s-80s  Patient home BP readings are ranging: no specific readings as she does not record, BP always normal in office and she reports the same at home  Patient is currently controlled on the following medications:  losartan 100mg    HCTZ 25mg   We discussed:  She reports 100% adherence with medications  Denies dizziness, headaches  Not much exercise outside of what she does around the house, taking care of her husband, etc.  BP well controlled at all OVs and at home per her report  Plan  Continue current medications      Hypertriglyceridemia   Lipid Panel     Component Value Date/Time   CHOL 173 06/19/2020 1231   TRIG 135 06/19/2020 1231   HDL 55 06/19/2020 1231   [place] 95 06/19/2020 1231     The 10-year ASCVD risk score Mikey Bussing DC Jr., et al., 2013) is: 6.3%   Values used to calculate the score:     Age: 25 years     Sex: Female     Is Non-Hispanic African American: No     Diabetic: No     Tobacco smoker: No     Systolic Blood Pressure: 578 mmHg     Is BP treated: Yes     HDL Cholesterol: 55 mg/dL     Total Cholesterol: 173 mg/dL   Patient has failed these meds in past: none noted Patient is currently controlled on the following medications:  . None currently  We discussed:  Most recent lipid panel is very acceptable  She is active taking care of her husband but not much strict cardio exercise  Plan  Continue control with diet.  Osteoarthritis    Patient has failed these meds in past: celecoxib 200mg , etodolac 500mg ,  Patient is currently controlled on the following medications:  Norco 5/325mg   Tylenol 500mg   Patient controlled currently, no concerns noted per patient.  Plan  Continue current medications   Vaccines   Reviewed and discussed patient's vaccination history.    Immunization History   Administered Date(s) Administered  . Influenza, High Dose Seasonal PF 09/30/2020  . Influenza, Quadrivalent, Recombinant, Inj, Pf 09/12/2018  . Influenza,inj,Quad PF,6+ Mos 08/27/2017, 09/12/2018, 08/23/2019  . Influenza-Unspecified 10/17/2015, 09/05/2016, 08/23/2019  . Moderna Sars-Covid-2 Vaccination 02/21/2020, 03/20/2020  . Pneumococcal Polysaccharide-23 03/18/2013, 06/19/2020  . Td 05/03/1998  . Tdap 03/21/2011  . Zoster Recombinat (Shingrix) 09/12/2018, 01/13/2019    Plan  Patient received her Pneumovas 23 vaccine as recommended at last visit.  Now UTD on all vaccines.  Medication Management   . Miscellaneous medications: Albuterol 11mcg inhaler, o Recommended supplementation with vitamin B12 to aid with neuropathy, she has not had a change to start this yet, but plans to pick some up soon. . OTC's: Calcium + Vit D . Patient currently uses [facility].  Phone #  [phone] . Patient reports using no specific method to organize medications and promote adherence. . Patient denies missed doses of medication.   Beverly Milch, PharmD Clinical Pharmacist [place] [phone]

## 2020-11-22 NOTE — Telephone Encounter (Signed)
Ok to refill??  Last office visit 06/19/2020.  Last refill 10/23/2020.

## 2020-11-22 NOTE — Patient Instructions (Addendum)
Visit Information  Goals Addressed            This Visit's Progress   . Pharmacy Care Plan:       CARE PLAN ENTRY  Current Barriers:  . Chronic Disease Management support, education, and care coordination needs related to Hypertension, Osteoarthritis, and hypertriglycerides.   Hypertension . Pharmacist Clinical Goal(s): o Over the next 120 days, patient will work with PharmD and providers to maintain BP goal <140/90 . Current regimen:  o Losartan 100mg  o HCTZ 25mg  . Interventions: o Comprehensive medication review o Recommended regular BP monitoring and recording . Patient self care activities - Over the next 120 days, patient will: o Check BP at least a few times per week, document, and provide at future appointments o Ensure daily salt intake < 2300 mg/day o Contact PharmD or PCP with elevated BP's > 140/90  Hypertriglyceridemia . Pharmacist Clinical Goal(s): o Over the next 120 days, patient will work with PharmD and providers to maintain TG goal < 150 . Current regimen:  o No medications currently . Interventions: o Discussed diet/exercise. . Patient self care activities - Over the next 120 days, patient will: o Watch fatty content of foods o Try to implement cardiovascular exercise if possible    Osteoarthritis . Pharmacist Clinical Goal(s) o Over the next 120 days, patient will work with PharmD and providers to optimize medications and minimize symptoms related to arthritis. . Current regimen:  o Hydrocodone 5/325mg  o Tylenol 500mg  tablets . Interventions: o Comprehensive medication review o Continue current therapy . Patient self care activities - Over the next 120 days, patient will: o Continue to take medication as directed o Focus on medication adherence by pill count o Contact PharmD or PCP with any increase in symptoms or medication related questions/concerns.  Please see past updates related to this goal by clicking on the "Past Updates" button in  the selected goal         The patient verbalized understanding of instructions, educational materials, and care plan provided today and agreed to receive a mailed copy of patient instructions, educational materials, and care plan.   Telephone follow up appointment with pharmacy team member scheduled for: 4 months  Edythe Clarity, [facility]  Preventing Hypertension Hypertension, commonly called high blood pressure, is when the force of blood pumping through the arteries is too strong. Arteries are blood vessels that carry blood from the heart throughout the body. Over time, hypertension can damage the arteries and decrease blood flow to important parts of the body, including the brain, heart, and kidneys. Often, hypertension does not cause symptoms until blood pressure is very high. For this reason, it is important to have your blood pressure checked on a regular basis. Hypertension can often be prevented with diet and lifestyle changes. If you already have hypertension, you can control it with diet and lifestyle changes, as well as medicine. What nutrition changes can be made? Maintain a healthy diet. This includes:  Eating less salt (sodium). Ask your health care provider how much sodium is safe for you to have. The general recommendation is to consume less than 1 tsp (2,300 mg) of sodium a day. ? Do not add salt to your food. ? Choose low-sodium options when grocery shopping and eating out.  Limiting fats in your diet. You can do this by eating low-fat or fat-free dairy products and by eating less red meat.  Eating more fruits, vegetables, and whole grains. Make a goal to eat: ? 1-2  cups of fresh fruits and vegetables each day. ? 3-4 servings of whole grains each day.  Avoiding foods and beverages that have added sugars.  Eating fish that contain healthy fats (omega-3 fatty acids), such as mackerel or salmon. If you need help putting together a healthy eating plan, try the DASH diet.  This diet is high in fruits, vegetables, and whole grains. It is low in sodium, red meat, and added sugars. DASH stands for Dietary Approaches to Stop Hypertension. What lifestyle changes can be made?   Lose weight if you are overweight. Losing just 3?5% of your body weight can help prevent or control hypertension. ? For example, if your present weight is 200 lb (91 kg), a loss of 3-5% of your weight means losing 6-10 lb (2.7-4.5 kg). ? Ask your health care provider to help you with a diet and exercise plan to safely lose weight.  Get enough exercise. Do at least 150 minutes of moderate-intensity exercise each week. ? You could do this in short exercise sessions several times a day, or you could do longer exercise sessions a few times a week. For example, you could take a brisk 10-minute walk or bike ride, 3 times a day, for 5 days a week.  Find ways to reduce stress, such as exercising, meditating, listening to music, or taking a yoga class. If you need help reducing stress, ask your health care provider.  Do not smoke. This includes e-cigarettes. Chemicals in tobacco and nicotine products raise your blood pressure each time you smoke. If you need help quitting, ask your health care provider.  Avoid alcohol. If you drink alcohol, limit alcohol intake to no more than 1 drink a day for nonpregnant women and 2 drinks a day for men. One drink equals 12 oz of beer, 5 oz of wine, or 1 oz of hard liquor. Why are these changes important? Diet and lifestyle changes can help you prevent hypertension, and they may make you feel better overall and improve your quality of life. If you have hypertension, making these changes will help you control it and help prevent major complications, such as:  Hardening and narrowing of arteries that supply blood to: ? Your heart. This can cause a heart attack. ? Your brain. This can cause a stroke. ? Your kidneys. This can cause kidney failure.  Stress on your heart  muscle, which can cause heart failure. What can I do to lower my risk?  Work with your health care provider to make a hypertension prevention plan that works for you. Follow your plan and keep all follow-up visits as told by your health care provider.  Learn how to check your blood pressure at home. Make sure that you know your personal target blood pressure, as told by your health care provider. How is this treated? In addition to diet and lifestyle changes, your health care provider may recommend medicines to help lower your blood pressure. You may need to try a few different medicines to find what works best for you. You also may need to take more than one medicine. Take over-the-counter and prescription medicines only as told by your health care provider. Where to find support Your health care provider can help you prevent hypertension and help you keep your blood pressure at a healthy level. Your local hospital or your community may also provide support services and prevention programs. The American Heart Association offers an online support network at: CheapBootlegs.com.cy Where to find more information Learn more about  hypertension from:  National Heart, Lung, and Blood Institute: ElectronicHangman.is  Centers for Disease Control and Prevention: https://ingram.com/  American Academy of Family Physicians: http://familydoctor.org/familydoctor/en/diseases-conditions/high-blood-pressure.printerview.all.html Learn more about the DASH diet from:  [place], Lung, and [place]: https://www.reyes.com/ Contact a health care provider if:  You think you are having a reaction to medicines you have taken.  You have recurrent headaches or feel dizzy.  You have swelling in your ankles.  You have trouble with your vision. Summary  Hypertension often does not cause any symptoms until blood  pressure is very high. It is important to get your blood pressure checked regularly.  Diet and lifestyle changes are the most important steps in preventing hypertension.  By keeping your blood pressure in a healthy range, you can prevent complications like heart attack, heart failure, stroke, and kidney failure.  Work with your health care provider to make a hypertension prevention plan that works for you. This information is not intended to replace advice given to you by your health care provider. Make sure you discuss any questions you have with your health care provider. Document Revised: 03/19/2019 Document Reviewed: 08/05/2016 Elsevier Patient Education  2020 [employer].

## 2020-11-22 NOTE — Telephone Encounter (Signed)
Refill- Hydrocodone

## 2020-12-14 ENCOUNTER — Other Ambulatory Visit: Payer: Self-pay | Admitting: Family Medicine

## 2020-12-14 NOTE — Telephone Encounter (Signed)
Patient called requesting a refill on her HYDROcodone-acetaminophen (NORCO) 5-325 MG tablet   CB# [phone]

## 2020-12-14 NOTE — Telephone Encounter (Signed)
Ok to refill??  Last office visit 06/19/2020.  Last refill 11/22/2020.

## 2020-12-15 MED ORDER — HYDROCODONE-ACETAMINOPHEN 5-325 MG PO TABS: 1.0000 | ORAL_TABLET | Freq: Four times a day (QID) | ORAL | 0 refills | Status: DC | PRN

## 2020-12-25 ENCOUNTER — Other Ambulatory Visit: Payer: Self-pay | Admitting: Family Medicine

## 2020-12-27 ENCOUNTER — Other Ambulatory Visit: Payer: Self-pay | Admitting: *Deleted

## 2020-12-27 MED ORDER — LOSARTAN POTASSIUM 100 MG PO TABS: 100.0000 mg | ORAL_TABLET | Freq: Every day | ORAL | 0 refills | Status: DC

## 2020-12-27 MED ORDER — HYDROCHLOROTHIAZIDE 25 MG PO TABS: 25.0000 mg | ORAL_TABLET | Freq: Every day | ORAL | 0 refills | Status: DC

## 2020-12-28 ENCOUNTER — Telehealth: Payer: Self-pay | Admitting: Family Medicine

## 2020-12-28 NOTE — Telephone Encounter (Signed)
Noted  

## 2020-12-28 NOTE — Telephone Encounter (Signed)
NO REFILL NEED PLEASE [place], [place] - [address]

## 2021-01-19 ENCOUNTER — Other Ambulatory Visit: Payer: Self-pay | Admitting: *Deleted

## 2021-01-19 MED ORDER — HYDROCODONE-ACETAMINOPHEN 5-325 MG PO TABS: 1.0000 | ORAL_TABLET | Freq: Four times a day (QID) | ORAL | 0 refills | Status: DC | PRN

## 2021-01-19 NOTE — Telephone Encounter (Signed)
Received call from patient.   Requested refill on Hydrocodone/ APAP.   Ok to refill??  Last office visit 12/28/2020.  Last refill 12/15/2020.

## 2021-01-26 ENCOUNTER — Encounter: Payer: Medicare Other | Admitting: Family Medicine

## 2021-01-29 ENCOUNTER — Telehealth: Payer: Self-pay | Admitting: *Deleted

## 2021-01-29 NOTE — Telephone Encounter (Signed)
Received call from patient.   Reports that she will be changing her provider to [facility].   PCP to be made aware.

## 2021-01-29 NOTE — Telephone Encounter (Signed)
Noted  

## 2021-01-31 ENCOUNTER — Telehealth: Payer: Self-pay | Admitting: Pharmacist

## 2021-01-31 NOTE — Progress Notes (Addendum)
Chronic Care Management Pharmacy Assistant   Name: Kathy Barr  MRN: [id number] DOB: 11-18-1956  Reason for Encounter: Disease State For CHL  Patient Questions:  1.  Have you seen any other providers since your last visit? Yes.   2.  Any changes in your medicines or health? Yes.  PCP : Alycia Rossetti, MD   Their chronic conditions include: hypertension, osteoarthritis of hip, hypertriglyceridemia.  Office Visits: None since 11/22/20  Consults: None since 11/22/20  Urgent Care: 12/16/20  Pollie Friar, FNP. TB Skin Test.   Allergies:  No Known Allergies  Medications: Outpatient Encounter Medications as of 01/31/2021  Medication Sig   acetaminophen (TYLENOL) 500 MG tablet Take 1,000 mg by mouth every 6 (six) hours as needed (pain).    albuterol (PROVENTIL HFA;VENTOLIN HFA) 108 (90 Base) MCG/ACT inhaler INHALE 2 PUFFS INTO LUNGS EVERY 6 HOURS AS NEEDED FOR WHEEZING OR SHORTNESS OF BREATH   Calcium Carb-Cholecalciferol (CALCIUM 600+D3 PO) Take 1 tablet by mouth 2 (two) times daily. (Patient not taking: Reported on 06/19/2020)   hydrochlorothiazide (HYDRODIURIL) 25 MG tablet Take 1 tablet (25 mg total) by mouth daily.   HYDROcodone-acetaminophen (NORCO) 5-325 MG tablet Take 1 tablet by mouth every 6 (six) hours as needed for moderate pain.   loratadine (CLARITIN) 10 MG tablet Take 10 mg by mouth daily.    losartan (COZAAR) 100 MG tablet Take 1 tablet (100 mg total) by mouth daily.   No facility-administered encounter medications on file as of 01/31/2021.    Current Diagnosis: Patient Active Problem List   Diagnosis Date Noted   Hx of colonic polyps 02/15/2019   Fusion of spine of cervical region 04/24/2018   Other spondylosis with radiculopathy, cervical region 03/05/2018   Hypertriglyceridemia 08/27/2017   Vertigo 02/04/2017   Frozen shoulder 10/24/2015   Atrophic vulvovaginitis 06/05/2015   DDD (degenerative disc disease), lumbar 01/13/2015   IBS (irritable  bowel syndrome) 07/12/2014   Osteoarthritis of right hip 03/16/2013    Class: Chronic   Generalized OA 10/29/2012   Essential hypertension, benign 10/29/2012   Obesity 10/29/2012    Goals Addressed   None    01/31/2021 Name: Kathy Barr MRN: [id number] DOB: 1956-04-02 Kathy Barr is a 65 y.o. year old female who is a primary care patient of [place], Modena Nunnery, MD.  Comprehensive medication review performed; Spoke to patient regarding cholesterol  Lipid Panel    Component Value Date/Time   CHOL 173 06/19/2020 1231   TRIG 135 06/19/2020 1231   HDL 55 06/19/2020 1231   [place] 95 06/19/2020 1231    10-year ASCVD risk score: The 10-year ASCVD risk score Mikey Bussing DC Brooke Bonito., et al., 2013) is: 4.8%   Values used to calculate the score:     Age: 65 years     Sex: Female     Is Non-Hispanic African American: No     Diabetic: No     Tobacco smoker: No     Systolic Blood Pressure: 937 mmHg     Is BP treated: Yes     HDL Cholesterol: 55 mg/dL     Total Cholesterol: 173 mg/dL  Current antihyperlipidemic regimen:  No medications at this current time.  Previous antihyperlipidemic medications tried: None noted.  ASCVD risk enhancing conditions: age >37 and HTN   What recent interventions/DTPs have been made by any provider to improve Cholesterol control since last CPP Visit: None.  Any recent hospitalizations or ED visits since last visit with CPP?  Patient stated no.   What diet changes have been made to improve Cholesterol?  Patient stated she has been eating frozen dinner from the grocery store. She stated she hasn't had a big appetite lately because of the lost of her fiance. Patient stated she drinks a lot of water.  What exercise is being done to improve Cholesterol?  Patient stated she does things around the house like cleaning but she doesn't have any regular exercises routine. She stated she has a animal that she take cares of daily. She stated she gets out and goes places.    Adherence Review: Does the patient have >5 day gap between last estimated fill dates? No  Patient stated she doesn't have to pay anything for medications and doesn't have any concerns about her medications at this time.   Follow-Up:  Pharmacist Review   Charlann Lange, RMA Clinical Pharmacist Assistant [phone]  10 minutes spent in review, coordination, and documentation.  Reviewed by: Beverly Milch, PharmD Clinical Pharmacist [facility] [phone]

## 2021-02-05 ENCOUNTER — Ambulatory Visit (INDEPENDENT_AMBULATORY_CARE_PROVIDER_SITE_OTHER): Payer: Medicare Other | Admitting: Family Medicine

## 2021-02-05 ENCOUNTER — Encounter: Payer: Self-pay | Admitting: Family Medicine

## 2021-02-05 ENCOUNTER — Other Ambulatory Visit: Payer: Self-pay

## 2021-02-05 ENCOUNTER — Telehealth: Payer: Self-pay | Admitting: Pharmacist

## 2021-02-05 VITALS — BP 105/70 | HR 87 | Temp 96.9°F | Ht 64.0 in | Wt 171.5 lb

## 2021-02-05 DIAGNOSIS — Z7689 Persons encountering health services in other specified circumstances: Secondary | ICD-10-CM | POA: Diagnosis not present

## 2021-02-05 DIAGNOSIS — J4520 Mild intermittent asthma, uncomplicated: Secondary | ICD-10-CM

## 2021-02-05 DIAGNOSIS — M159 Polyosteoarthritis, unspecified: Secondary | ICD-10-CM | POA: Diagnosis not present

## 2021-02-05 DIAGNOSIS — I10 Essential (primary) hypertension: Secondary | ICD-10-CM | POA: Diagnosis not present

## 2021-02-05 DIAGNOSIS — Z6829 Body mass index (BMI) 29.0-29.9, adult: Secondary | ICD-10-CM | POA: Diagnosis not present

## 2021-02-05 NOTE — Patient Instructions (Signed)

## 2021-02-05 NOTE — Progress Notes (Signed)
New Patient Office Visit  Subjective:  Patient ID: Kathy Barr, female    DOB: 11/22/1956  Age: 65 y.o. MRN: [id number]  CC:  Chief Complaint  Patient presents with  . New Patient (Initial Visit)    HPI [name] presents to establish care. She is doing well overall.   1. HTN Complaint with meds - Yes Current Medications - losartan 100 mg daily Checking BP at home ranging: at goal Exercising Regularly - housework Watching Salt intake - Yes Pertinent ROS:  Headache - No Fatigue - No Visual Disturbances - No Chest pain - No Dyspnea - No Palpitations - No LE edema - No They report good compliance with medications and can restate their regimen by memory. No medication side effects.  Family, social, and smoking history reviewed.   BP Readings from Last 3 Encounters:  02/05/21 105/70  06/19/20 122/64  08/25/19 106/72   CMP Latest Ref Rng & Units 06/19/2020 12/28/2018 04/10/2018  Glucose 65 - 99 mg/dL 95 95 103(H)  BUN 7 - 25 mg/dL '8 11 6  ' Creatinine 0.50 - 0.99 mg/dL 0.67 0.75 0.73  Sodium 135 - 146 mmol/L 137 139 137  Potassium 3.5 - 5.3 mmol/L 3.8 3.7 3.3(L)  Chloride 98 - 110 mmol/L 98 102 100(L)  CO2 20 - 32 mmol/L '31 24 26  ' Calcium 8.6 - 10.4 mg/dL 9.8 9.9 9.4  Total Protein 6.1 - 8.1 g/dL 7.3 7.2 -  Total Bilirubin 0.2 - 1.2 mg/dL 1.2 1.0 -  Alkaline Phos 33 - 130 U/L - - -  AST 10 - 35 U/L 16 16 -  ALT 6 - 29 U/L 10 12 -    2. Generalized OA [place] reports OA in her neck, spine, hips, and knees. She take Norco as needed for pain. She generally takes 30 tablets a month. This was prescribed by her previous PCP.   3. Asthma Intermittent. Uses albuterol inhaler occasionally.    Past Medical History:  Diagnosis Date  . Arthritis    "all over" (03/18/2013)  . Carpal tunnel syndrome    "right" (03/18/2013)  . Fracture 2013   RLE  . GERD (gastroesophageal reflux disease)    OTC  . Hypertension   . IBS (irritable bowel syndrome)   . Osteoarthritis of  left hip   . Seizures ([place]) 1960's; 1978   "when I was little; when I was 7 months pregnant" (03/18/2013)  . Shortness of breath    "not since I quit smoking" (03/18/2013)    Past Surgical History:  Procedure Laterality Date  . ANTERIOR CERVICAL DECOMP/DISCECTOMY FUSION  ?2003  . ANTERIOR CERVICAL DECOMP/DISCECTOMY FUSION N/A 04/24/2018   Procedure: C6-7 PLATE REMOVAL, F4-2, C5-6 ANTERIOR CERVICAL DECOMPRESSION/DISCECTOMY & FUSION, ALLOGRAFT, PLATE;  Surgeon: Marybelle Killings, MD;  Location: [facility];  Service: Orthopedics;  Laterality: N/A;  . CHOLECYSTECTOMY  2003  . COLONOSCOPY N/A 02/17/2014   Procedure: COLONOSCOPY;  Surgeon: Rogene Houston, MD;  [facility];  Service: Endoscopy;  Laterality: N/A;  145-moved to 10:30 Ann to notify pt  . COLONOSCOPY N/A 08/25/2019   Procedure: COLONOSCOPY;  Surgeon: Rogene Houston, MD;  [facility];  Service: Endoscopy;  Laterality: N/A;  1030  . FOOT ARTHROTOMY Right 1990's   pin, after removing a bone   . JOINT REPLACEMENT Left    hip and right  . pinched nerve    . POLYPECTOMY  08/25/2019   Procedure: POLYPECTOMY;  Surgeon: Rogene Houston, MD;  [facility];  Service: Endoscopy;;  . TOTAL HIP ARTHROPLASTY  06/30/2012   Procedure: TOTAL HIP ARTHROPLASTY;  Surgeon: Jessy Oto, MD;  Location: [facility];  Service: Orthopedics;  Laterality: Left;  Left total hip replacement with metal and polypropylene pore coated implants  . TOTAL HIP ARTHROPLASTY Right 03/16/2013   Procedure: RIGHT TOTAL HIP ARTHROPLASTY- right ;  Surgeon: Jessy Oto, MD;  Location: [facility];  Service: Orthopedics;  Laterality: Right;  . TUBAL LIGATION  1978  . VAGINAL HYSTERECTOMY  ~ 2003    Family History  Problem Relation Age of Onset  . Hypertension Father   . Diabetes Father   . Hypertension Sister   . Diabetes Sister   . Hypertension Brother   . Diabetes Brother   . Diabetes Mother   . Hypertension Mother   . Hyperlipidemia Mother   . COPD  Brother   . Heart disease Brother   . Hypertension Brother     Social History   Socioeconomic History  . Marital status: Divorced    Spouse name: Not on file  . Number of children: 1  . Years of education: 5  . Highest education level: 9th grade  Occupational History    Employer: DISABLED  Tobacco Use  . Smoking status: Former Smoker    Packs/day: 0.25    Years: 25.00    Pack years: 6.25    Types: Cigarettes    Quit date: 02/21/2020    Years since quitting: 0.9  . Smokeless tobacco: Never Used  . Tobacco comment: Quit 3 months ago  Vaping Use  . Vaping Use: Never used  Substance and Sexual Activity  . Alcohol use: No    Alcohol/week: 0.0 standard drinks  . Drug use: No  . Sexual activity: Yes  Other Topics Concern  . Not on file  Social History Narrative  . Not on file   Social Determinants of Health   Financial Resource Strain: High Risk  . Difficulty of Paying Living Expenses: Hard  Food Insecurity: Not on file  Transportation Needs: Not on file  Physical Activity: Not on file  Stress: Not on file  Social Connections: Not on file  Intimate Partner Violence: Not on file    ROS Review of Systems Negative unless specially indicated above in HPI.  Objective:   Today's Vitals: BP 105/70   Pulse 87   Temp (!) 96.9 F (36.1 C) (Temporal)   Ht '5\' 4"'  (1.626 m)   Wt 171 lb 8 oz (77.8 kg)   BMI 29.44 kg/m   Physical Exam Vitals and nursing note reviewed.  Constitutional:      General: She is not in acute distress.    Appearance: Normal appearance. She is not ill-appearing.  HENT:     Head: Normocephalic and atraumatic.  Eyes:     Extraocular Movements: Extraocular movements intact.     Conjunctiva/sclera: Conjunctivae normal.     Pupils: Pupils are equal, round, and reactive to light.  Cardiovascular:     Rate and Rhythm: Normal rate and regular rhythm.     Pulses: Normal pulses.     Heart sounds: Normal heart sounds. No murmur  heard.   Pulmonary:     Effort: Pulmonary effort is normal. No respiratory distress.     Breath sounds: Normal breath sounds.  Abdominal:     General: Bowel sounds are normal. There is no distension.     Palpations: Abdomen is soft. There is no mass.  Tenderness: There is no abdominal tenderness. There is no guarding or rebound.  Musculoskeletal:     Cervical back: Neck supple. No tenderness.     Right lower leg: No edema.     Left lower leg: No edema.  Lymphadenopathy:     Cervical: No cervical adenopathy.  Skin:    General: Skin is warm and dry.  Neurological:     General: No focal deficit present.     Mental Status: She is alert and oriented to person, place, and time.  Psychiatric:        Mood and Affect: Mood normal.        Behavior: Behavior normal.     Assessment & Plan:   Skyla was seen today for new patient (initial visit).  Diagnoses and all orders for this visit:  Essential hypertension, benign Well controlled on current regimen. Continue losartan. Labs pending as below, she did have a soda about 2 hours ago.  -     CBC with Differential/Platelet -     CMP14+EGFR -     Lipid panel  Generalized OA Currently taking Norco as needed. Discusses that controlled substances can not be refilled on first appointment. Reviewed PDMP, no red flags. She may schedule an appointment to sign CSA and obtain toxassure for refill.   Mild intermittent asthma without complication Well controlled on current regimen. Continue albuterol as needed.   BMI 29.0-29.9,adult Labs pending as below. Diet and exercise.  -     CBC with Differential/Platelet -     CMP14+EGFR -     Lipid panel -     TSH  Encounter to establish care Reviewed available records.    Follow-up: Return in about 6 months (around 08/05/2021) for chronic follow up, anytime for pain contract.   The patient indicates understanding of these issues and agrees with the plan.   Gwenlyn Perking, FNP

## 2021-02-05 NOTE — Progress Notes (Signed)
    Chronic Care Management Pharmacy Assistant   Name: Kathy Barr  MRN: [id number] DOB: 09-21-1956  Reason for Encounter: Adherence Review  PCP : Gwenlyn Perking, FNP  Verified Adherence Gap Information. Per insurance data, the patient is 90-99% compliant with the HTN (Losartan) medication. Per insurance data patient has not met their wellness bundle and annual wellness visit screening. The patients colorectal cancer screening is current as well as the patients breast cancer screening.Their most recent A1C was 5.1 on 06/19/20 and their most recent blood pressure was 122/64 on 06/19/20. The patients total gaps-all measures is equal to 2.  Follow-Up:  Pharmacist Review   Charlann Lange, [place] Pharmacist Assistant [phone]

## 2021-02-06 LAB — CBC WITH DIFFERENTIAL/PLATELET
Basophils Absolute: 0 10*3/uL (ref 0.0–0.2)
Basos: 1 %
EOS (ABSOLUTE): 0.2 10*3/uL (ref 0.0–0.4)
Eos: 2 %
Hematocrit: 44.7 % (ref 34.0–46.6)
Hemoglobin: 15 g/dL (ref 11.1–15.9)
Immature Grans (Abs): 0 10*3/uL (ref 0.0–0.1)
Immature Granulocytes: 0 %
Lymphocytes Absolute: 1.8 10*3/uL (ref 0.7–3.1)
Lymphs: 27 %
MCH: 28.3 pg (ref 26.6–33.0)
MCHC: 33.6 g/dL (ref 31.5–35.7)
MCV: 84 fL (ref 79–97)
Monocytes Absolute: 0.5 10*3/uL (ref 0.1–0.9)
Monocytes: 8 %
Neutrophils Absolute: 4.1 10*3/uL (ref 1.4–7.0)
Neutrophils: 62 %
Platelets: 246 10*3/uL (ref 150–450)
RBC: 5.3 x10E6/uL — ABNORMAL HIGH (ref 3.77–5.28)
RDW: 12.6 % (ref 11.7–15.4)
WBC: 6.7 10*3/uL (ref 3.4–10.8)

## 2021-02-06 LAB — CMP14+EGFR
ALT: 10 IU/L (ref 0–32)
AST: 19 IU/L (ref 0–40)
Albumin/Globulin Ratio: 1.5 (ref 1.2–2.2)
Albumin: 4.4 g/dL (ref 3.8–4.8)
Alkaline Phosphatase: 102 IU/L (ref 44–121)
BUN/Creatinine Ratio: 12 (ref 12–28)
BUN: 8 mg/dL (ref 8–27)
Bilirubin Total: 0.7 mg/dL (ref 0.0–1.2)
CO2: 26 mmol/L (ref 20–29)
Calcium: 9.7 mg/dL (ref 8.7–10.3)
Chloride: 96 mmol/L (ref 96–106)
Creatinine, Ser: 0.69 mg/dL (ref 0.57–1.00)
Globulin, Total: 3 g/dL (ref 1.5–4.5)
Glucose: 89 mg/dL (ref 65–99)
Potassium: 4.2 mmol/L (ref 3.5–5.2)
Sodium: 137 mmol/L (ref 134–144)
Total Protein: 7.4 g/dL (ref 6.0–8.5)
eGFR: 96 mL/min/{1.73_m2} (ref >59)

## 2021-02-06 LAB — LIPID PANEL
Chol/HDL Ratio: 3 ratio (ref 0.0–4.4)
Cholesterol, Total: 184 mg/dL (ref 100–199)
HDL: 61 mg/dL (ref >39)
LDL Chol Calc (NIH): 100 mg/dL — ABNORMAL HIGH (ref 0–99)
Triglycerides: 132 mg/dL (ref 0–149)
VLDL Cholesterol Cal: 23 mg/dL (ref 5–40)

## 2021-02-06 LAB — TSH: TSH: 2.18 u[IU]/mL (ref 0.450–4.500)

## 2021-02-20 ENCOUNTER — Telehealth: Payer: Self-pay

## 2021-02-20 NOTE — Telephone Encounter (Signed)
Please review and advise.

## 2021-02-21 NOTE — Telephone Encounter (Signed)
Patient aware appointment made 

## 2021-02-21 NOTE — Telephone Encounter (Signed)
She will need to schedule an appointment for refills and to sign CSA with me.

## 2021-02-23 ENCOUNTER — Ambulatory Visit (INDEPENDENT_AMBULATORY_CARE_PROVIDER_SITE_OTHER): Payer: Medicare Other | Admitting: Family Medicine

## 2021-02-23 ENCOUNTER — Other Ambulatory Visit: Payer: Self-pay

## 2021-02-23 ENCOUNTER — Encounter: Payer: Self-pay | Admitting: Family Medicine

## 2021-02-23 VITALS — BP 103/61 | HR 75 | Temp 97.4°F | Ht 64.0 in | Wt 174.1 lb

## 2021-02-23 DIAGNOSIS — Z79899 Other long term (current) drug therapy: Secondary | ICD-10-CM | POA: Diagnosis not present

## 2021-02-23 DIAGNOSIS — Z1231 Encounter for screening mammogram for malignant neoplasm of breast: Secondary | ICD-10-CM | POA: Diagnosis not present

## 2021-02-23 DIAGNOSIS — Z9189 Other specified personal risk factors, not elsewhere classified: Secondary | ICD-10-CM

## 2021-02-23 DIAGNOSIS — M159 Polyosteoarthritis, unspecified: Secondary | ICD-10-CM

## 2021-02-23 MED ORDER — HYDROCODONE-ACETAMINOPHEN 5-325 MG PO TABS: 1.0000 | ORAL_TABLET | Freq: Four times a day (QID) | ORAL | 0 refills | Status: DC | PRN

## 2021-02-23 NOTE — Progress Notes (Signed)
Established Patient Office Visit  Subjective:  Patient ID: Kathy Barr, female    DOB: 17-Oct-1956  Age: 65 y.o. MRN: [id number]  CC:  Chief Complaint  Patient presents with  . controlled substance contract    HPI Kathy Barr presents for controlled substance contract for chronic pain. She takes Norco every 6 hours prn for pain. This was prescribed by her previous PCP and she is how an established patient here.   [place] Controlled Substance Abuse database reviewed- Yes If yes- were their any concerning findings : NO  Depression screen [facility] 2/9 02/23/2021 02/05/2021 06/19/2020 12/28/2018 06/26/2018  Decreased Interest 0 0 0 0 0  Down, Depressed, Hopeless 0 0 1 0 0  PHQ - 2 Score 0 0 1 0 0  Altered sleeping - - 2 - -  Tired, decreased energy - - 1 - -  Change in appetite - - 0 - -  Feeling bad or failure about yourself  - - 0 - -  Trouble concentrating - - 0 - -  Moving slowly or fidgety/restless - - 0 - -  Suicidal thoughts - - 0 - -  PHQ-9 Score - - 4 - -  Difficult doing work/chores - - Not difficult at all - -    No flowsheet data found.   Toxassure drug screen performed- Yes, completed today  SOAPP  0= never  1= seldom  2=sometimes  3= often  4= very often  How often do you have mood swings? 1 How often do you smoke a cigarette within an hour after waking up? 2 How often have you taken medication other than the way that it was prescribed?0 How often have you used illegal drugs in the past 5 years? 0 How often, in your lifetime, have you had legal problems or been arrested? 0  Score 3  Alcohol Audit - How often during the last year have found that you: 0-Never   1- Less than monthly   2- Monthly     3-Weekly     4-daily or almost daily  - found that you were not able to stop drinking once you started- 0 -failed to do what was normally expected of you because of drinking- 0 -needed a first drink in the morning- 0 -had a feeling of guilt or remorse after  drinking- 0 -are/were unable to remember what happened the night before because of your drinking- 0  0- NO   2- yes but not in last year  4- yes during last year -Have you or someone else been injured because of your drinking- 0 - Has anyone been concerned about your drinking or suggested you cut down- 0        TOTAL- 0  ( 0-7- alcohol education, 8-15- simple advice, 16-19 simple advice plus counseling, 20-40 referral for evaluation and treatment )  No flowsheet data found.   Designated Pharmacy- [facility] [place]  Pain assessment: Cause of pain- chronic pain from OA Pain location- hips, neck, back, shoulders, knees, feet Pain on scale of 1-10- 8/10 Frequency- in some part every day What increases pain- activity What makes pain Better- resting Effects on ADL - takes her longer to do housework and yardwork, has to take frequent breaks due to pain  Prior treatments tried and failed- celebrex, gabapentin, advil, voltaren, mobic, tylenol Current opioids rx- Norco 5-325 mg every 6 hours prn, she usually takes 30 tablets a month and typically takes 1 a day.  # prescribed- 30  Morphine mg equivalent- 37.5 per 30 days, 1.3 mme per day avg.   Pain management agreement reviewed and signed- Yes   Past Medical History:  Diagnosis Date  . Arthritis    "all over" (03/18/2013)  . Carpal tunnel syndrome    "right" (03/18/2013)  . Fracture 2013   RLE  . GERD (gastroesophageal reflux disease)    OTC  . Hypertension   . IBS (irritable bowel syndrome)   . Osteoarthritis of left hip   . Seizures ([place]) 1960's; 1978   "when I was little; when I was 7 months pregnant" (03/18/2013)  . Shortness of breath    "not since I quit smoking" (03/18/2013)    Past Surgical History:  Procedure Laterality Date  . ANTERIOR CERVICAL DECOMP/DISCECTOMY FUSION  ?2003  . ANTERIOR CERVICAL DECOMP/DISCECTOMY FUSION N/A 04/24/2018   Procedure: C6-7 PLATE REMOVAL, F6-3, C5-6 ANTERIOR CERVICAL  DECOMPRESSION/DISCECTOMY & FUSION, ALLOGRAFT, PLATE;  Surgeon: Marybelle Killings, MD;  Location: [place];  Service: Orthopedics;  Laterality: N/A;  . CHOLECYSTECTOMY  2003  . COLONOSCOPY N/A 02/17/2014   Procedure: COLONOSCOPY;  Surgeon: Rogene Houston, MD;  [facility];  Service: Endoscopy;  Laterality: N/A;  145-moved to 10:30 Ann to notify pt  . COLONOSCOPY N/A 08/25/2019   Procedure: COLONOSCOPY;  Surgeon: Rogene Houston, MD;  [facility];  Service: Endoscopy;  Laterality: N/A;  1030  . FOOT ARTHROTOMY Right 1990's   pin, after removing a bone   . JOINT REPLACEMENT Left    hip and right  . pinched nerve    . POLYPECTOMY  08/25/2019   Procedure: POLYPECTOMY;  Surgeon: Rogene Houston, MD;  [facility];  Service: Endoscopy;;  . TOTAL HIP ARTHROPLASTY  06/30/2012   Procedure: TOTAL HIP ARTHROPLASTY;  Surgeon: Jessy Oto, MD;  Location: [place];  Service: Orthopedics;  Laterality: Left;  Left total hip replacement with metal and polypropylene pore coated implants  . TOTAL HIP ARTHROPLASTY Right 03/16/2013   Procedure: RIGHT TOTAL HIP ARTHROPLASTY- right ;  Surgeon: Jessy Oto, MD;  Location: [place];  Service: Orthopedics;  Laterality: Right;  . TUBAL LIGATION  1978  . VAGINAL HYSTERECTOMY  ~ 2003    Family History  Problem Relation Age of Onset  . Hypertension Father   . Diabetes Father   . Hypertension Sister   . Diabetes Sister   . Hypertension Brother   . Diabetes Brother   . Diabetes Mother   . Hypertension Mother   . Hyperlipidemia Mother   . COPD Brother   . Heart disease Brother   . Hypertension Brother     Social History   Socioeconomic History  . Marital status: Divorced    Spouse name: Not on file  . Number of children: 1  . Years of education: 100  . Highest education level: 9th grade  Occupational History    Employer: DISABLED  Tobacco Use  . Smoking status: Former Smoker    Packs/day: 0.25    Years: 25.00    Pack years: 6.25     Types: Cigarettes    Quit date: 02/21/2020    Years since quitting: 1.0  . Smokeless tobacco: Never Used  . Tobacco comment: Quit 3 months ago  Vaping Use  . Vaping Use: Never used  Substance and Sexual Activity  . Alcohol use: No    Alcohol/week: 0.0 standard drinks  . Drug use: No  . Sexual activity: Yes  Other Topics Concern  .  Not on file  Social History Narrative  . Not on file   Social Determinants of Health   Financial Resource Strain: High Risk  . Difficulty of Paying Living Expenses: Hard  Food Insecurity: Not on file  Transportation Needs: Not on file  Physical Activity: Not on file  Stress: Not on file  Social Connections: Not on file  Intimate Partner Violence: Not on file    Outpatient Medications Prior to Visit  Medication Sig Dispense Refill  . acetaminophen (TYLENOL) 500 MG tablet Take 1,000 mg by mouth every 6 (six) hours as needed (pain).     Marland Kitchen albuterol (PROVENTIL HFA;VENTOLIN HFA) 108 (90 Base) MCG/ACT inhaler INHALE 2 PUFFS INTO LUNGS EVERY 6 HOURS AS NEEDED FOR WHEEZING OR SHORTNESS OF BREATH 18 g 2  . Calcium Carb-Cholecalciferol (CALCIUM 600+D3 PO) Take 1 tablet by mouth 2 (two) times daily.    . hydrochlorothiazide (HYDRODIURIL) 25 MG tablet Take 1 tablet (25 mg total) by mouth daily. 90 tablet 0  . HYDROcodone-acetaminophen (NORCO) 5-325 MG tablet Take 1 tablet by mouth every 6 (six) hours as needed for moderate pain. 30 tablet 0  . loratadine (CLARITIN) 10 MG tablet Take 10 mg by mouth daily.    Marland Kitchen losartan (COZAAR) 100 MG tablet Take 1 tablet (100 mg total) by mouth daily. 90 tablet 0   No facility-administered medications prior to visit.    No Known Allergies  ROS Review of Systems As per HPI.    Objective:    Physical Exam Vitals and nursing note reviewed.  Constitutional:      General: She is not in acute distress.    Appearance: Normal appearance. She is not ill-appearing.  HENT:     Head: Normocephalic and atraumatic.  Eyes:      Extraocular Movements: Extraocular movements intact.     Conjunctiva/sclera: Conjunctivae normal.     Pupils: Pupils are equal, round, and reactive to light.  Cardiovascular:     Rate and Rhythm: Normal rate and regular rhythm.     Pulses: Normal pulses.     Heart sounds: Normal heart sounds. No murmur heard.   Pulmonary:     Effort: Pulmonary effort is normal. No respiratory distress.     Breath sounds: Normal breath sounds.  Musculoskeletal:     Right lower leg: No edema.     Left lower leg: No edema.  Skin:    General: Skin is warm and dry.  Neurological:     General: No focal deficit present.     Mental Status: She is alert and oriented to person, place, and time.  Psychiatric:        Mood and Affect: Mood normal.        Behavior: Behavior normal.     BP 103/61   Pulse 75   Temp (!) 97.4 F (36.3 C) (Temporal)   Ht 5\' 4"  (1.626 m)   Wt 174 lb 2 oz (79 kg)   BMI 29.89 kg/m  Wt Readings from Last 3 Encounters:  02/23/21 174 lb 2 oz (79 kg)  02/05/21 171 lb 8 oz (77.8 kg)  06/19/20 179 lb (81.2 kg)     Health Maintenance Due  Topic Date Due  . COVID-19 Vaccine (3 - Booster for Moderna series) 09/19/2020    There are no preventive care reminders to display for this patient.  Lab Results  Component Value Date   TSH 2.180 02/05/2021   Lab Results  Component Value Date   WBC 6.7 02/05/2021  HGB 15.0 02/05/2021   HCT 44.7 02/05/2021   MCV 84 02/05/2021   PLT 246 02/05/2021   Lab Results  Component Value Date   NA 137 02/05/2021   K 4.2 02/05/2021   CO2 26 02/05/2021   GLUCOSE 89 02/05/2021   BUN 8 02/05/2021   CREATININE 0.69 02/05/2021   BILITOT 0.7 02/05/2021   ALKPHOS 102 02/05/2021   AST 19 02/05/2021   ALT 10 02/05/2021   PROT 7.4 02/05/2021   ALBUMIN 4.4 02/05/2021   CALCIUM 9.7 02/05/2021   ANIONGAP 11 04/10/2018   Lab Results  Component Value Date   CHOL 184 02/05/2021   Lab Results  Component Value Date   HDL 61 02/05/2021    Lab Results  Component Value Date   LDLCALC 100 (H) 02/05/2021   Lab Results  Component Value Date   TRIG 132 02/05/2021   Lab Results  Component Value Date   CHOLHDL 3.0 02/05/2021   Lab Results  Component Value Date   HGBA1C 5.1 06/19/2020      Assessment & Plan:   Alfredo was seen today for controlled substance contract.  Diagnoses and all orders for this visit:  Generalized OA Controlled substance agreement signed PDMP reviewed, no red flags. CSA signed today. Toxassure obtained. Refills provided as below for 30 tablets each.  -     HYDROcodone-acetaminophen (NORCO) 5-325 MG tablet; Take 1 tablet by mouth every 6 (six) hours as needed for moderate pain. -     HYDROcodone-acetaminophen (NORCO) 5-325 MG tablet; Take 1 tablet by mouth every 6 (six) hours as needed for moderate pain. -     HYDROcodone-acetaminophen (NORCO) 5-325 MG tablet; Take 1 tablet by mouth every 6 (six) hours as needed for moderate pain. -     ToxASSURE Select 13 (MW), Urine  Encounter for screening mammogram for malignant neoplasm of breast -     MM Digital Screening; Future  At risk for decreased bone density -     DG [facility] DEXA; Future   Follow-up: Return in about 3 months (around 05/26/2021) for pain.   The patient indicates understanding of these issues and agrees with the plan.    Gwenlyn Perking, FNP

## 2021-02-23 NOTE — Patient Instructions (Signed)
Arthritis Arthritis is a term that is commonly used to refer to joint pain or joint disease. There are more than 100 types of arthritis. What are the causes? The most common cause of this condition is wear and tear of a joint. Other causes include:  Gout.  Inflammation of a joint.  An infection of a joint.  Sprains and other injuries near the joint.  A reaction to medicines or drugs, or an allergic reaction. In some cases, the cause may not be known. What are the signs or symptoms? The main symptom of this condition is pain in the joint during movement. Other symptoms include:  Redness, swelling, or stiffness at a joint.  Warmth coming from the joint.  Fever.  Overall feeling of illness. How is this diagnosed? This condition may be diagnosed with a physical exam and tests, including:  Blood tests.  Urine tests.  Imaging tests, such as X-rays, an MRI, or a CT scan. Sometimes, fluid is removed from a joint for testing. How is this treated? This condition may be treated with:  Treatment of the cause, if it is known.  Rest.  Raising (elevating) the joint.  Applying cold or hot packs to the joint.  Medicines to improve symptoms and reduce inflammation.  Injections of a steroid such as cortisone into the joint to help reduce pain and inflammation. Depending on the cause of your arthritis, you may need to make lifestyle changes to reduce stress on your joint. Changes may include:  Exercising more.  Losing weight. Follow these instructions at home: Medicines  Take over-the-counter and prescription medicines only as told by your health care provider.  Do not take aspirin to relieve pain if your health care provider thinks that gout may be causing your pain. Activity  Rest your joint if told by your health care provider. Rest is important when your disease is active and your joint feels painful, swollen, or stiff.  Avoid activities that make the pain worse. It is  important to balance activity with rest.  Exercise your joint regularly with range-of-motion exercises as told by your health care provider. Try doing low-impact exercise, such as: ? Swimming. ? Water aerobics. ? Biking. ? Walking. Managing pain, stiffness, and swelling  If directed, put ice on the joint. ? Put ice in a plastic bag. ? Place a towel between your skin and the bag. ? Leave the ice on for 20 minutes, 2-3 times per day.  If your joint is swollen, raise (elevate) it above the level of your heart if directed by your health care provider.  If your joint feels stiff in the morning, try taking a warm shower.  If directed, apply heat to the affected area as often as told by your health care provider. Use the heat source that your health care provider recommends, such as a moist heat pack or a heating pad. If you have diabetes, do not apply heat without permission from your health care provider. To apply heat: ? Place a towel between your skin and the heat source. ? Leave the heat on for 20-30 minutes. ? Remove the heat if your skin turns bright red. This is especially important if you are unable to feel pain, heat, or cold. You may have a greater risk of getting burned.      General instructions  Do not use any products that contain nicotine or tobacco, such as cigarettes, e-cigarettes, and chewing tobacco. If you need help quitting, ask your health care provider.    Keep all follow-up visits as told by your health care provider. This is important. Contact a health care provider if:  The pain gets worse.  You have a fever. Get help right away if:  You develop severe joint pain, swelling, or redness.  Many joints become painful and swollen.  You develop severe back pain.  You develop severe weakness in your leg.  You cannot control your bladder or bowels. Summary  Arthritis is a term that is commonly used to refer to joint pain or joint disease. There are more than  100 types of arthritis.  The most common cause of this condition is wear and tear of a joint. Other causes include gout, inflammation or infection of the joint, sprains, or allergies.  Symptoms of this condition include redness, swelling, or stiffness of the joint. Other symptoms include warmth, fever, or feeling ill.  This condition is treated with rest, elevation, medicines, and applying cold or hot packs.  Follow your health care provider's instructions about medicines, activity, exercises, and other home care treatments. This information is not intended to replace advice given to you by your health care provider. Make sure you discuss any questions you have with your health care provider. Document Revised: 11/02/2018 Document Reviewed: 11/02/2018 Elsevier Patient Education  2021 [employer]vier Inc.  

## 2021-03-05 LAB — TOXASSURE SELECT 13 (MW), URINE

## 2021-03-27 ENCOUNTER — Other Ambulatory Visit: Payer: Self-pay | Admitting: Family Medicine

## 2021-03-28 ENCOUNTER — Telehealth: Payer: Self-pay

## 2021-03-29 ENCOUNTER — Telehealth: Payer: Self-pay

## 2021-03-30 ENCOUNTER — Other Ambulatory Visit: Payer: Self-pay

## 2021-03-30 ENCOUNTER — Other Ambulatory Visit: Payer: Self-pay | Admitting: Family Medicine

## 2021-03-30 ENCOUNTER — Ambulatory Visit: Payer: Medicare Other | Admitting: Family Medicine

## 2021-04-02 ENCOUNTER — Other Ambulatory Visit: Payer: Self-pay | Admitting: Family Medicine

## 2021-04-04 ENCOUNTER — Telehealth: Payer: Self-pay

## 2021-04-04 ENCOUNTER — Other Ambulatory Visit: Payer: Self-pay | Admitting: Family Medicine

## 2021-04-04 NOTE — Telephone Encounter (Signed)
Pt called stating that she needs refills on her BP medicine sent to [facility] in [place]. Says she was previously seeing Dr Dennard Schaumann who was prescribing them but pt no longer those there. Pt is our patient.

## 2021-04-04 NOTE — Telephone Encounter (Signed)
Ok to refill 

## 2021-04-05 MED ORDER — LOSARTAN POTASSIUM 100 MG PO TABS: 100.0000 mg | ORAL_TABLET | Freq: Every day | ORAL | 0 refills | Status: DC

## 2021-04-05 MED ORDER — HYDROCHLOROTHIAZIDE 25 MG PO TABS: 25.0000 mg | ORAL_TABLET | Freq: Every day | ORAL | 0 refills | Status: DC

## 2021-04-05 NOTE — Addendum Note (Signed)
Addended by: Zannie Cove on: 04/05/2021 10:26 AM   Modules accepted: Orders

## 2021-04-05 NOTE — Telephone Encounter (Signed)
BP refills sent

## 2021-04-16 ENCOUNTER — Other Ambulatory Visit: Payer: Self-pay | Admitting: Family Medicine

## 2021-05-25 ENCOUNTER — Encounter: Payer: Self-pay | Admitting: Family Medicine

## 2021-05-25 ENCOUNTER — Other Ambulatory Visit: Payer: Self-pay

## 2021-05-25 ENCOUNTER — Ambulatory Visit (INDEPENDENT_AMBULATORY_CARE_PROVIDER_SITE_OTHER): Payer: Medicare Other | Admitting: Family Medicine

## 2021-05-25 ENCOUNTER — Ambulatory Visit (INDEPENDENT_AMBULATORY_CARE_PROVIDER_SITE_OTHER): Payer: Medicare Other

## 2021-05-25 VITALS — BP 125/64 | HR 75 | Temp 97.1°F | Ht 64.0 in | Wt 180.2 lb

## 2021-05-25 DIAGNOSIS — Z780 Asymptomatic menopausal state: Secondary | ICD-10-CM | POA: Diagnosis not present

## 2021-05-25 DIAGNOSIS — Z79899 Other long term (current) drug therapy: Secondary | ICD-10-CM

## 2021-05-25 DIAGNOSIS — I10 Essential (primary) hypertension: Secondary | ICD-10-CM

## 2021-05-25 DIAGNOSIS — J4520 Mild intermittent asthma, uncomplicated: Secondary | ICD-10-CM

## 2021-05-25 DIAGNOSIS — E781 Pure hyperglyceridemia: Secondary | ICD-10-CM | POA: Diagnosis not present

## 2021-05-25 DIAGNOSIS — Z9189 Other specified personal risk factors, not elsewhere classified: Secondary | ICD-10-CM

## 2021-05-25 DIAGNOSIS — M159 Polyosteoarthritis, unspecified: Secondary | ICD-10-CM | POA: Diagnosis not present

## 2021-05-25 DIAGNOSIS — M85831 Other specified disorders of bone density and structure, right forearm: Secondary | ICD-10-CM | POA: Diagnosis not present

## 2021-05-25 MED ORDER — HYDROCODONE-ACETAMINOPHEN 5-325 MG PO TABS: 1.0000 | ORAL_TABLET | Freq: Four times a day (QID) | ORAL | 0 refills | Status: DC | PRN

## 2021-05-25 MED ORDER — LOSARTAN POTASSIUM 100 MG PO TABS: 100.0000 mg | ORAL_TABLET | Freq: Every day | ORAL | 1 refills | Status: DC

## 2021-05-25 MED ORDER — ALBUTEROL SULFATE HFA 108 (90 BASE) MCG/ACT IN AERS: INHALATION_SPRAY | RESPIRATORY_TRACT | 2 refills | Status: DC

## 2021-05-25 MED ORDER — HYDROCHLOROTHIAZIDE 25 MG PO TABS: 25.0000 mg | ORAL_TABLET | Freq: Every day | ORAL | 1 refills | Status: DC

## 2021-05-25 NOTE — Progress Notes (Signed)
Established Patient Office Visit  Subjective:  Patient ID: Kathy Barr, female    DOB: September 09, 1956  Age: 65 y.o. MRN: [id number]  CC:  Chief Complaint  Patient presents with   Osteoarthritis    HPI CALVARY DIFRANCO presents for chronic follow up.  HTN Complaint with meds - Yes Current Medications - losartan and HCTZ  Checking BP at home ranging 563S sytolic Exercising Regularly - occasionally as tolerated Watching Salt intake - Yes Pertinent ROS:  Headache - No Fatigue - No Visual Disturbances - No Chest pain - No Dyspnea - No Palpitations - No LE edema - No They report good compliance with medications and can restate their regimen by memory. No medication side effects.  She is fasting this morning other than some diet green tea.   Family, social, and smoking history reviewed.   BP Readings from Last 3 Encounters:  05/25/21 125/64  02/23/21 103/61  02/05/21 105/70   2. Generalized OA Pain assessment: Cause of pain- generalized OA Pain location- all joints, mostly shoulder and hips Pain on scale of 1-10- 8 Frequency- daily What increases pain- activity What makes pain Better-rest Effects on ADL - difficulty doing housework Any change in general medical condition-no  Current opioids rx- Norco q 6 hours prn # meds rx- 30 tablets monthly Effectiveness of current meds-very helpful Adverse reactions from pain meds- denies Morphine equivalent- 150 mme per Rx, 5 mme per day avg  Pill count performed-No Last drug screen - 02/23/21 ( high risk q71m moderate risk q662mlow risk yearly ) Urine drug screen today- No Was the [facility]- yes  If yes were their any concerning findings? - no  Overdose risk: 200 No flowsheet data found.  Pain contract signed on: 02/23/21     Past Medical History:  Diagnosis Date   Arthritis    "all over" (03/18/2013)   Carpal tunnel syndrome    "right" (03/18/2013)   Fracture 2013   RLE   GERD (gastroesophageal reflux  disease)    OTC   Hypertension    IBS (irritable bowel syndrome)    Osteoarthritis of left hip    Seiz[place]; 1978   "when I was little; when I was 7 months pregnant" (03/18/2013)   Shortness of breath    "not since I quit smoking" (03/18/2013)    Past Surgical History:  Procedure Laterality Date   ANTERIOR CERVICAL DECOMP/DISCECTOMY FUSION  ?2003   ANTERIOR CERVICAL DECOMP/DISCECTOMY FUSION N/A 04/24/2018   Procedure: C6-7 PLATE REMOVAL, C4L3-7C5-6 ANTERIOR CERVICAL DECOMPRESSION/DISCECTOMY & FUSION, ALLOGRAFT, PLATE;  Surgeon: YaMarybelle KillingsMD;  [facility];  Laterality: N/A;   CHOLECYSTECTOMY  2003   COLONOSCOPY N/A 02/17/2014   Procedure: COLONOSCOPY;  Surgeon: NaRogene HoustonMD;  [facility];  Service: Endoscopy;  Laterality: N/A;  145-moved to 10:30 Ann to notify pt   COLONOSCOPY N/A 08/25/2019   Procedure: COLONOSCOPY;  Surgeon: ReRogene HoustonMD;  [facility];  Service: Endoscopy;  Laterality: N/A;  1030   FOOT ARTHROTOMY Right 1990's   pin, after removing a bone    JOINT REPLACEMENT Left    hip and right   pinched nerve     POLYPECTOMY  08/25/2019   Procedure: POLYPECTOMY;  Surgeon: ReRogene HoustonMD;  [facility];  Service: Endoscopy;;   TOTAL HIP ARTHROPLASTY  06/30/2012   Procedure: TOTAL HIP ARTHROPLASTY;  Surgeon: JaJessy OtoMD;  [facility];  Service: Orthopedics;  Laterality: Left;  Left total hip replacement with metal and polypropylene pore coated implants   TOTAL HIP ARTHROPLASTY Right 03/16/2013   Procedure: RIGHT TOTAL HIP ARTHROPLASTY- right ;  Surgeon: Jessy Oto, MD;  Location: [facility];  Service: Orthopedics;  Laterality: Right;   TUBAL LIGATION  1978   VAGINAL HYSTERECTOMY  ~ 2003    Family History  Problem Relation Age of Onset   Hypertension Father    Diabetes Father    Hypertension Sister    Diabetes Sister    Hypertension Brother    Diabetes Brother    Diabetes  Mother    Hypertension Mother    Hyperlipidemia Mother    COPD Brother    Heart disease Brother    Hypertension Brother     Social History   Socioeconomic History   Marital status: Divorced    Spouse name: Not on file   Number of children: 1   Years of education: 9   Highest education level: 9th grade  Occupational History    Employer: DISABLED  Tobacco Use   Smoking status: Former    Packs/day: 0.25    Years: 25.00    Pack years: 6.25    Types: Cigarettes    Quit date: 02/21/2020    Years since quitting: 1.2   Smokeless tobacco: Never   Tobacco comments:    Quit 3 months ago  Vaping Use   Vaping Use: Never used  Substance and Sexual Activity   Alcohol use: No    Alcohol/week: 0.0 standard drinks   Drug use: No   Sexual activity: Yes  Other Topics Concern   Not on file  Social History Narrative   Not on file   Social Determinants of Health   Financial Resource Strain: Not on file  Food Insecurity: Not on file  Transportation Needs: Not on file  Physical Activity: Not on file  Stress: Not on file  Social Connections: Not on file  Intimate Partner Violence: Not on file    Outpatient Medications Prior to Visit  Medication Sig Dispense Refill   acetaminophen (TYLENOL) 500 MG tablet Take 1,000 mg by mouth every 6 (six) hours as needed (pain).      albuterol (PROVENTIL HFA;VENTOLIN HFA) 108 (90 Base) MCG/ACT inhaler INHALE 2 PUFFS INTO LUNGS EVERY 6 HOURS AS NEEDED FOR WHEEZING OR SHORTNESS OF BREATH 18 g 2   Calcium Carb-Cholecalciferol (CALCIUM 600+D3 PO) Take 1 tablet by mouth 2 (two) times daily.     hydrochlorothiazide (HYDRODIURIL) 25 MG tablet Take 1 tablet (25 mg total) by mouth daily. 90 tablet 0   HYDROcodone-acetaminophen (NORCO) 5-325 MG tablet Take 1 tablet by mouth every 6 (six) hours as needed for moderate pain. 30 tablet 0   loratadine (CLARITIN) 10 MG tablet Take 10 mg by mouth daily.     losartan (COZAAR) 100 MG tablet Take 1 tablet (100 mg  total) by mouth daily. 90 tablet 0   No facility-administered medications prior to visit.    No Known Allergies  ROS Review of Systems As per HPI.    Objective:    Physical Exam Vitals and nursing note reviewed.  Constitutional:      General: She is not in acute distress.    Appearance: Normal appearance. She is not ill-appearing, toxic-appearing or diaphoretic.  HENT:     Head: Normocephalic and atraumatic.  Eyes:     Extraocular Movements: Extraocular movements intact.     Conjunctiva/sclera: Conjunctivae normal.  Pupils: Pupils are equal, round, and reactive to light.  Cardiovascular:     Rate and Rhythm: Normal rate and regular rhythm.     Pulses: Normal pulses.     Heart sounds: Normal heart sounds. No murmur heard. Pulmonary:     Effort: Pulmonary effort is normal. No respiratory distress.     Breath sounds: Normal breath sounds.  Musculoskeletal:     Right lower leg: No edema.     Left lower leg: No edema.  Skin:    General: Skin is warm and dry.  Neurological:     General: No focal deficit present.     Mental Status: She is alert and oriented to person, place, and time.  Psychiatric:        Mood and Affect: Mood normal.        Behavior: Behavior normal.    BP 125/64   Pulse 75   Temp (!) 97.1 F (36.2 C) (Oral)   Ht '5\' 4"'  (1.626 m)   Wt 180 lb 4 oz (81.8 kg)   BMI 30.94 kg/m  Wt Readings from Last 3 Encounters:  05/25/21 180 lb 4 oz (81.8 kg)  02/23/21 174 lb 2 oz (79 kg)  02/05/21 171 lb 8 oz (77.8 kg)     There are no preventive care reminders to display for this patient.  There are no preventive care reminders to display for this patient.  Lab Results  Component Value Date   TSH 2.180 02/05/2021   Lab Results  Component Value Date   WBC 6.7 02/05/2021   HGB 15.0 02/05/2021   HCT 44.7 02/05/2021   MCV 84 02/05/2021   PLT 246 02/05/2021   Lab Results  Component Value Date   NA 137 02/05/2021   K 4.2 02/05/2021   CO2 26  02/05/2021   GLUCOSE 89 02/05/2021   BUN 8 02/05/2021   CREATININE 0.69 02/05/2021   BILITOT 0.7 02/05/2021   ALKPHOS 102 02/05/2021   AST 19 02/05/2021   ALT 10 02/05/2021   PROT 7.4 02/05/2021   ALBUMIN 4.4 02/05/2021   CALCIUM 9.7 02/05/2021   ANIONGAP 11 04/10/2018   EGFR 96 02/05/2021   Lab Results  Component Value Date   CHOL 184 02/05/2021   Lab Results  Component Value Date   HDL 61 02/05/2021   Lab Results  Component Value Date   LDLCALC 100 (H) 02/05/2021   Lab Results  Component Value Date   TRIG 132 02/05/2021   Lab Results  Component Value Date   CHOLHDL 3.0 02/05/2021   Lab Results  Component Value Date   HGBA1C 5.1 06/19/2020      Assessment & Plan:   Gracie was seen today for osteoarthritis.  Diagnoses and all orders for this visit:  Essential hypertension, benign Well controlled on current regimen. Labs pending -     hydrochlorothiazide (HYDRODIURIL) 25 MG tablet; Take 1 tablet (25 mg total) by mouth daily. -     losartan (COZAAR) 100 MG tablet; Take 1 tablet (100 mg total) by mouth daily. -     CBC with Differential/Platelet -     CMP14+EGFR  Hypertriglyceridemia Labs pending.  -     Lipid panel  Mild intermittent asthma without complication Well controlled on current regimen.  -     albuterol (VENTOLIN HFA) 108 (90 Base) MCG/ACT inhaler; INHALE 2 PUFFS INTO LUNGS EVERY 6 HOURS AS NEEDED FOR WHEEZING OR SHORTNESS OF BREATH  Generalized OA Controlled substance agreement signed PDMP reviewed,  no red flags. UDS and CSA up to date.  -     HYDROcodone-acetaminophen (NORCO) 5-325 MG tablet; Take 1 tablet by mouth every 6 (six) hours as needed for moderate pain. -     HYDROcodone-acetaminophen (NORCO) 5-325 MG tablet; Take 1 tablet by mouth every 6 (six) hours as needed for moderate pain. -     HYDROcodone-acetaminophen (NORCO) 5-325 MG tablet; Take 1 tablet by mouth every 6 (six) hours as needed for moderate pain.   Follow-up: Return in  about 3 months (around 08/25/2021) for chronic follow up.   The patient indicates understanding of these issues and agrees with the plan.  Gwenlyn Perking, FNP

## 2021-05-25 NOTE — Patient Instructions (Signed)
Osteoarthritis Osteoarthritis is a type of arthritis. It refers to joint pain or joint disease. Osteoarthritis affects tissue that covers the ends of bones in joints (cartilage). Cartilage acts as a cushion between the bones and helps them move smoothly. Osteoarthritis occurs when cartilage in the joints gets worn down. Osteoarthritis is sometimes called "wear and tear" arthritis. Osteoarthritis is the most common form of arthritis. It often occurs in older people. It is a condition that gets worse over time. The joints most often affected by this condition are in the fingers, toes, hips, knees, and spine, including the neck and lower back. What are the causes? This condition is caused by the wearing down of cartilage that covers the ends of bones. What increases the risk? The following factors may make you more likely to develop this condition: Being age 50 or older. Obesity. Overuse of joints. Past injury of a joint. Past surgery on a joint. Family history of osteoarthritis. What are the signs or symptoms? The main symptoms of this condition are pain, swelling, and stiffness in the joint. Other symptoms may include: An enlarged joint. More pain and further damage caused by small pieces of bone or cartilage that break off and float inside of the joint. Small deposits of bone (osteophytes) that grow on the edges of the joint. A grating or scraping feeling inside the joint when you move it. Popping or creaking sounds when you move. Difficulty walking or exercising. An inability to grip items, twist your hand(s), or control the movements of your hands and fingers. How is this diagnosed? This condition may be diagnosed based on: Your medical history. A physical exam. Your symptoms. X-rays of the affected joint(s). Blood tests to rule out other types of arthritis. How is this treated? There is no cure for this condition, but treatment can help control pain and improve joint function.  Treatment may include a combination of therapies, such as: Pain relief techniques, such as: Applying heat and cold to the joint. Massage. A form of talk therapy called cognitive behavioral therapy (CBT). This therapy helps you set goals and follow up on the changes that you make. Medicines for pain and inflammation. The medicines can be taken by mouth or applied to the skin. They include: NSAIDs, such as ibuprofen. Prescription medicines. Strong anti-inflammatory medicines (corticosteroids). Certain nutritional supplements. A prescribed exercise program. You may work with a physical therapist. Assistive devices, such as a brace, wrap, splint, specialized glove, or cane. A weight control plan. Surgery, such as: An osteotomy. This is done to reposition the bones and relieve pain or to remove loose pieces of bone and cartilage. Joint replacement surgery. You may need this surgery if you have advanced osteoarthritis. Follow these instructions at home: Activity Rest your affected joints as told by your health care provider. Exercise as told by your health care provider. He or she may recommend specific types of exercise, such as: Strengthening exercises. These are done to strengthen the muscles that support joints affected by arthritis. Aerobic activities. These are exercises, such as brisk walking or water aerobics, that increase your heart rate. Range-of-motion activities. These help your joints move more easily. Balance and agility exercises. Managing pain, stiffness, and swelling   If directed, apply heat to the affected area as often as told by your health care provider. Use the heat source that your health care provider recommends, such as a moist heat pack or a heating pad. If you have a removable assistive device, remove it as told by   your health care provider. Place a towel between your skin and the heat source. If your health care provider tells you to keep the assistive device on  while you apply heat, place a towel between the assistive device and the heat source. Leave the heat on for 20-30 minutes. Remove the heat if your skin turns bright red. This is especially important if you are unable to feel pain, heat, or cold. You may have a greater risk of getting burned. If directed, put ice on the affected area. To do this: If you have a removable assistive device, remove it as told by your health care provider. Put ice in a plastic bag. Place a towel between your skin and the bag. If your health care provider tells you to keep the assistive device on during icing, place a towel between the assistive device and the bag. Leave the ice on for 20 minutes, 2-3 times a day. Move your fingers or toes often to reduce stiffness and swelling. Raise (elevate) the injured area above the level of your heart while you are sitting or lying down. General instructions Take over-the-counter and prescription medicines only as told by your health care provider. Maintain a healthy weight. Follow instructions from your health care provider for weight control. Do not use any products that contain nicotine or tobacco, such as cigarettes, e-cigarettes, and chewing tobacco. If you need help quitting, ask your health care provider. Use assistive devices as told by your health care provider. Keep all follow-up visits as told by your health care provider. This is important. Where to find more information National Institute of Arthritis and Musculoskeletal and Skin Diseases: www.niams.nih.gov National Institute on Aging: www.nia.nih.gov American College of Rheumatology: www.rheumatology.org Contact a health care provider if: You have redness, swelling, or a feeling of warmth in a joint that gets worse. You have a fever along with joint or muscle aches. You develop a rash. You have trouble doing your normal activities. Get help right away if: You have pain that gets worse and is not relieved by  pain medicine. Summary Osteoarthritis is a type of arthritis that affects tissue covering the ends of bones in joints (cartilage). This condition is caused by the wearing down of cartilage that covers the ends of bones. The main symptom of this condition is pain, swelling, and stiffness in the joint. There is no cure for this condition, but treatment can help control pain and improve joint function. This information is not intended to replace advice given to you by your health care provider. Make sure you discuss any questions you have with your health care provider. Document Revised: 11/22/2019 Document Reviewed: 11/22/2019 Elsevier Patient Education  2022 [employer]  

## 2021-05-26 LAB — CBC WITH DIFFERENTIAL/PLATELET
Basophils Absolute: 0 10*3/uL (ref 0.0–0.2)
Basos: 1 %
EOS (ABSOLUTE): 0.2 10*3/uL (ref 0.0–0.4)
Eos: 3 %
Hematocrit: 39.1 % (ref 34.0–46.6)
Hemoglobin: 13.4 g/dL (ref 11.1–15.9)
Immature Grans (Abs): 0 10*3/uL (ref 0.0–0.1)
Immature Granulocytes: 1 %
Lymphocytes Absolute: 1.4 10*3/uL (ref 0.7–3.1)
Lymphs: 22 %
MCH: 28.9 pg (ref 26.6–33.0)
MCHC: 34.3 g/dL (ref 31.5–35.7)
MCV: 84 fL (ref 79–97)
Monocytes Absolute: 0.5 10*3/uL (ref 0.1–0.9)
Monocytes: 8 %
Neutrophils Absolute: 4.1 10*3/uL (ref 1.4–7.0)
Neutrophils: 65 %
Platelets: 185 10*3/uL (ref 150–450)
RBC: 4.63 x10E6/uL (ref 3.77–5.28)
RDW: 13.1 % (ref 11.7–15.4)
WBC: 6.3 10*3/uL (ref 3.4–10.8)

## 2021-05-26 LAB — CMP14+EGFR
ALT: 14 IU/L (ref 0–32)
AST: 18 IU/L (ref 0–40)
Albumin/Globulin Ratio: 1.5 (ref 1.2–2.2)
Albumin: 4 g/dL (ref 3.8–4.8)
Alkaline Phosphatase: 87 IU/L (ref 44–121)
BUN/Creatinine Ratio: 8 — ABNORMAL LOW (ref 12–28)
BUN: 5 mg/dL — ABNORMAL LOW (ref 8–27)
Bilirubin Total: 0.8 mg/dL (ref 0.0–1.2)
CO2: 26 mmol/L (ref 20–29)
Calcium: 9 mg/dL (ref 8.7–10.3)
Chloride: 93 mmol/L — ABNORMAL LOW (ref 96–106)
Creatinine, Ser: 0.59 mg/dL (ref 0.57–1.00)
Globulin, Total: 2.6 g/dL (ref 1.5–4.5)
Glucose: 83 mg/dL (ref 65–99)
Potassium: 3.5 mmol/L (ref 3.5–5.2)
Sodium: 135 mmol/L (ref 134–144)
Total Protein: 6.6 g/dL (ref 6.0–8.5)
eGFR: 99 mL/min/{1.73_m2} (ref >59)

## 2021-05-26 LAB — LIPID PANEL
Chol/HDL Ratio: 2.8 ratio (ref 0.0–4.4)
Cholesterol, Total: 158 mg/dL (ref 100–199)
HDL: 56 mg/dL (ref >39)
LDL Chol Calc (NIH): 83 mg/dL (ref 0–99)
Triglycerides: 106 mg/dL (ref 0–149)
VLDL Cholesterol Cal: 19 mg/dL (ref 5–40)

## 2021-05-28 ENCOUNTER — Encounter: Payer: Self-pay | Admitting: Family Medicine

## 2021-05-28 ENCOUNTER — Ambulatory Visit (INDEPENDENT_AMBULATORY_CARE_PROVIDER_SITE_OTHER): Payer: Medicare Other

## 2021-05-28 VITALS — Ht 64.0 in | Wt 175.0 lb

## 2021-05-28 DIAGNOSIS — M8580 Other specified disorders of bone density and structure, unspecified site: Secondary | ICD-10-CM | POA: Insufficient documentation

## 2021-05-28 DIAGNOSIS — Z0000 Encounter for general adult medical examination without abnormal findings: Secondary | ICD-10-CM | POA: Diagnosis not present

## 2021-05-28 HISTORY — DX: Other specified disorders of bone density and structure, unspecified site: M85.80

## 2021-05-28 NOTE — Progress Notes (Signed)
Subjective:   Kathy Barr is a 65 y.o. female who presents for Medicare Annual (Subsequent) preventive examination.  Virtual Visit via Telephone Note  I connected with  Kathy Barr on 05/28/21 at  3:30 PM EDT by telephone and verified that I am speaking with the correct person using two identifiers.  Location: Patient: Home Provider: [facility] Persons participating in the virtual visit: patient/Nurse Health Advisor   I discussed the limitations, risks, security and privacy concerns of performing an evaluation and management service by telephone and the availability of in person appointments. The patient expressed understanding and agreed to proceed.  Interactive audio and video telecommunications were attempted between this nurse and patient, however failed, due to patient having technical difficulties OR patient did not have access to video capability.  We continued and completed visit with audio only.  Some vital signs may be absent or patient reported.   Kathy Barr E Landen Knoedler, LPN   Review of Systems     Cardiac Risk Factors include: advanced age (>41men, >10 women);obesity (BMI >30kg/m2);sedentary lifestyle;dyslipidemia;hypertension     Objective:    Today's Vitals   05/28/21 1537  Weight: 175 lb (79.4 kg)  Height: 5\' 4"  (1.626 m)  PainSc: 3    Body mass index is 30.04 kg/m.  Advanced Directives 05/28/2021 08/25/2019 12/28/2018 05/02/2018 04/24/2018 04/10/2018 02/16/2018  Does Patient Have a Medical Advance Directive? No No No No No No No  Would patient like information on creating a medical advance directive? No - Patient declined No - Patient declined No - Patient declined - No - Patient declined No - Patient declined No - Patient declined  Pre-existing out of facility DNR order (yellow form or pink MOST form) - - - - - - -    Current Medications (verified) Outpatient Encounter Medications as of 05/28/2021  Medication Sig   acetaminophen (TYLENOL) 500 MG tablet Take 1,000 mg by  mouth every 6 (six) hours as needed (pain).    albuterol (VENTOLIN HFA) 108 (90 Base) MCG/ACT inhaler INHALE 2 PUFFS INTO LUNGS EVERY 6 HOURS AS NEEDED FOR WHEEZING OR SHORTNESS OF BREATH   Calcium Carb-Cholecalciferol (CALCIUM 600+D3 PO) Take 1 tablet by mouth 2 (two) times daily.   hydrochlorothiazide (HYDRODIURIL) 25 MG tablet Take 1 tablet (25 mg total) by mouth daily.   HYDROcodone-acetaminophen (NORCO) 5-325 MG tablet Take 1 tablet by mouth every 6 (six) hours as needed for moderate pain.   [START ON 06/25/2021] HYDROcodone-acetaminophen (NORCO) 5-325 MG tablet Take 1 tablet by mouth every 6 (six) hours as needed for moderate pain.   [START ON 07/26/2021] HYDROcodone-acetaminophen (NORCO) 5-325 MG tablet Take 1 tablet by mouth every 6 (six) hours as needed for moderate pain.   loratadine (CLARITIN) 10 MG tablet Take 10 mg by mouth daily.   losartan (COZAAR) 100 MG tablet Take 1 tablet (100 mg total) by mouth daily.   No facility-administered encounter medications on file as of 05/28/2021.    Allergies (verified) Patient has no known allergies.   History: Past Medical History:  Diagnosis Date   Arthritis    "all over" (03/18/2013)   Carpal tunnel syndrome    "right" (03/18/2013)   Fracture 2013   RLE   GERD (gastroesophageal reflux disease)    OTC   Hypertension    IBS (irritable bowel syndrome)    Osteoarthritis of left hip    Osteopenia 05/28/2021   Seizures ([place]) 1960's; 1978   "when I was little; when I was 7 months pregnant" (03/18/2013)  Shortness of breath    "not since I quit smoking" (03/18/2013)   Past Surgical History:  Procedure Laterality Date   ANTERIOR CERVICAL DECOMP/DISCECTOMY FUSION  ?03-09-2002   ANTERIOR CERVICAL DECOMP/DISCECTOMY FUSION N/A 04/24/2018   Procedure: C6-7 PLATE REMOVAL, I4-5, C5-6 ANTERIOR CERVICAL DECOMPRESSION/DISCECTOMY & FUSION, ALLOGRAFT, PLATE;  Surgeon: Marybelle Killings, MD;  Location: [facility];  Service: Orthopedics;  Laterality: N/A;    CHOLECYSTECTOMY  2002/03/09   COLONOSCOPY N/A 02/17/2014   Procedure: COLONOSCOPY;  Surgeon: Rogene Houston, MD;  [facility];  Service: Endoscopy;  Laterality: N/A;  145-moved to 10:30 Ann to notify pt   COLONOSCOPY N/A 08/25/2019   Procedure: COLONOSCOPY;  Surgeon: Rogene Houston, MD;  [facility];  Service: Endoscopy;  Laterality: N/A;  1030   FOOT ARTHROTOMY Right 1990's   pin, after removing a bone    JOINT REPLACEMENT Left    hip and right   pinched nerve     POLYPECTOMY  08/25/2019   Procedure: POLYPECTOMY;  Surgeon: Rogene Houston, MD;  [facility];  Service: Endoscopy;;   TOTAL HIP ARTHROPLASTY  06/30/2012   Procedure: TOTAL HIP ARTHROPLASTY;  Surgeon: Jessy Oto, MD;  Location: [facility];  Service: Orthopedics;  Laterality: Left;  Left total hip replacement with metal and polypropylene pore coated implants   TOTAL HIP ARTHROPLASTY Right 03/16/2013   Procedure: RIGHT TOTAL HIP ARTHROPLASTY- right ;  Surgeon: Jessy Oto, MD;  Location: [facility];  Service: Orthopedics;  Laterality: Right;   TUBAL LIGATION  1978   VAGINAL HYSTERECTOMY  ~ 2002/03/09   Family History  Problem Relation Age of Onset   Hypertension Father    Diabetes Father    Hypertension Sister    Diabetes Sister    Hypertension Brother    Diabetes Brother    Diabetes Mother    Hypertension Mother    Hyperlipidemia Mother    COPD Brother    Heart disease Brother    Hypertension Brother    Social History   Socioeconomic History   Marital status: Divorced    Spouse name: Not on file   Number of children: 1   Years of education: 9   Highest education level: 9th grade  Occupational History    Employer: DISABLED  Tobacco Use   Smoking status: Former    Packs/day: 0.25    Years: 25.00    Pack years: 6.25    Types: Cigarettes    Quit date: 02/21/2020    Years since quitting: 1.2   Smokeless tobacco: Never   Tobacco comments:    Quit 3 months ago  Vaping Use   Vaping Use: Never  used  Substance and Sexual Activity   Alcohol use: No    Alcohol/week: 0.0 standard drinks   Drug use: No   Sexual activity: Yes  Other Topics Concern   Not on file  Social History Narrative   Her significant other of 15 years passed away in 2020-03-09   She lives alone. Daughter and mother live nearby   Social Determinants of Health   Financial Resource Strain: Medium Risk   Difficulty of Paying Living Expenses: Somewhat hard  Food Insecurity: No Food Insecurity   Worried About Charity fundraiser in the Last Year: Never true   Arboriculturist in the Last Year: Never true  Transportation Needs: No Transportation Needs   Lack of Transportation (Medical): No   Lack of Transportation (Non-Medical): No  Physical Activity: Insufficiently Active   Days of Exercise per Week: 7 days   Minutes of Exercise per Session: 10 min  Stress: No Stress Concern Present   Feeling of Stress : Not at all  Social Connections: Socially Isolated   Frequency of Communication with Friends and Family: More than three times a week   Frequency of Social Gatherings with Friends and Family: More than three times a week   Attends Religious Services: Never   Marine scientist or Organizations: No   Attends Music therapist: Never   Marital Status: Divorced    Tobacco Counseling Counseling given: Not Answered Tobacco comments: Quit 3 months ago   Clinical Intake:  Pre-visit preparation completed: Yes  Pain : 0-10 Pain Score: 3  Pain Type: Chronic pain Pain Location: Generalized Pain Descriptors / Indicators: Dull, Discomfort Pain Onset: More than a month ago Pain Frequency: Intermittent     BMI - recorded: 30.4 Nutritional Status: BMI > 30  Obese Nutritional Risks: None Diabetes: No  How often do you need to have someone help you when you read instructions, pamphlets, or other written materials from your doctor or pharmacy?: 1 - Never  Diabetic? No  Interpreter Needed?:  No  Information entered by :: Kathy Franzoni, LPN   Activities of Daily Living In your present state of health, do you have any difficulty performing the following activities: 05/28/2021  Hearing? N  Vision? N  Difficulty concentrating or making decisions? N  Walking or climbing stairs? Y  Dressing or bathing? N  Doing errands, shopping? N  Preparing Food and eating ? N  Using the Toilet? N  In the past six months, have you accidently leaked urine? N  Do you have problems with loss of bowel control? N  Managing your Medications? N  Managing your Finances? N  Housekeeping or managing your Housekeeping? N  Some recent data might be hidden    Patient Care Team: Gwenlyn Perking, FNP as PCP - General (Family Medicine) Edythe Clarity, [facility] as Pharmacist (Pharmacist)  Indicate any recent Medical Services you may have received from other than [facility] providers in the past year (date may be approximate).     Assessment:   This is a routine wellness examination for Kathy Barr.  Hearing/Vision screen Hearing Screening - Comments:: C/o mild hearing loss - denies hearing aids Vision Screening - Comments:: Wears eyeglasses - behind on annual eye exam with [facility] in [place] - has appt 06/15/21  Dietary issues and exercise activities discussed: Current Exercise Habits: The patient does not participate in regular exercise at present, Exercise limited by: orthopedic condition(s);respiratory conditions(s)   Goals Addressed             This Visit's Progress    Exercise 3x per week (30 min per time)         Depression Screen PHQ 2/9 Scores 05/28/2021 05/25/2021 02/23/2021 02/05/2021 06/19/2020 12/28/2018 06/26/2018  PHQ - 2 Score 0 0 0 0 1 0 0  PHQ- 9 Score 0 0 - - 4 - -    Fall Risk Fall Risk  05/28/2021 05/25/2021 02/05/2021 06/19/2020 12/28/2018  Falls in the past year? 0 0 0 0 0  Number falls in past yr: 0 - - - -  Injury with Fall? 0 - - - -  Risk for fall due to : Orthopedic patient - - No  Fall Risks -  Follow up Falls prevention discussed - - Falls evaluation completed Falls evaluation completed  FALL RISK PREVENTION PERTAINING TO THE HOME:  Any stairs in or around the home? No  If so, are there any without handrails? No  Home free of loose throw rugs in walkways, pet beds, electrical cords, etc? Yes  Adequate lighting in your home to reduce risk of falls? Yes   ASSISTIVE DEVICES UTILIZED TO PREVENT FALLS:  Life alert? No  Use of a cane, walker or w/c? No  Grab bars in the bathroom? Yes  Shower chair or bench in shower? Yes  Elevated toilet seat or a handicapped toilet? Yes   TIMED UP AND GO:  Was the test performed? No . Telephonic visit.  Cognitive Function:     6CIT Screen 05/28/2021  What Year? 0 points  What month? 0 points  What time? 0 points  Count back from 20 0 points  Months in reverse 0 points  Repeat phrase 0 points  Total Score 0    Immunizations Immunization History  Administered Date(s) Administered   Influenza, High Dose Seasonal PF 09/30/2020   Influenza, Quadrivalent, Recombinant, Inj, Pf 09/12/2018   Influenza,inj,Quad PF,6+ Mos 08/27/2017, 09/12/2018, 08/23/2019   Influenza-Unspecified 10/17/2015, 09/05/2016, 08/23/2019   Moderna Sars-Covid-2 Vaccination 02/21/2020, 03/20/2020   Pneumococcal Polysaccharide-23 03/18/2013, 06/19/2020   Td 05/03/1998   Tdap 03/21/2011   Zoster Recombinat (Shingrix) 09/12/2018, 01/13/2019    TDAP status: Due, Education has been provided regarding the importance of this vaccine. Advised may receive this vaccine at local pharmacy or Health Dept. Aware to provide a copy of the vaccination record if obtained from local pharmacy or Health Dept. Verbalized acceptance and understanding.  Flu Vaccine status: Up to date  Pneumococcal vaccine status: Up to date  Covid-19 vaccine status: Completed vaccines  Qualifies for Shingles Vaccine? Yes   Zostavax completed Yes   Shingrix Completed?:  Yes  Screening Tests Health Maintenance  Topic Date Due   TETANUS/TDAP  05/25/2022 (Originally 03/20/2021)   COVID-19 Vaccine (3 - Moderna risk series) 06/10/2022 (Originally 04/17/2020)   PNA vac Low Risk Adult (2 of 2 - PCV13) 06/19/2021   INFLUENZA VACCINE  07/09/2021   MAMMOGRAM  02/16/2022   COLONOSCOPY (Pts 45-67yrs Insurance coverage will need to be confirmed)  08/24/2026   DEXA SCAN  Completed   Hepatitis C Screening  Completed   Zoster Vaccines- Shingrix  Completed   HPV VACCINES  Aged Out    Health Maintenance  There are no preventive care reminders to display for this patient.  Colorectal cancer screening: Type of screening: Colonoscopy. Completed 08/25/2019. Repeat every 7 years  Mammogram status: Ordered 05/2021. Pt provided with contact info and advised to call to schedule appt.  She has appt 06/06/21  Bone Density status: Completed 05/25/2021. Results reflect: Bone density results: OSTEOPENIA. Repeat every 2 years.  Lung Cancer Screening: (Low Dose CT Chest recommended if Age 57-80 years, 30 pack-year currently smoking OR have quit w/in 15years.) does not qualify.   Additional Screening:  Hepatitis C Screening: does qualify; Completed 02/04/2017  Vision Screening: Recommended annual ophthalmology exams for early detection of glaucoma and other disorders of the eye. Is the patient up to date with their annual eye exam?  No  Who is the provider or what is the name of the office in which the patient attends annual eye exams? [facility] in [place] If pt is not established with a provider, would they like to be referred to a provider to establish care? No .   Dental Screening: Recommended annual dental exams for proper oral hygiene  Community Resource Referral / Chronic Care Management: CRR required this visit?  No   CCM required this visit?  No      Plan:     I have personally reviewed and noted the following in the patient's chart:   Medical and social  history Use of alcohol, tobacco or illicit drugs  Current medications and supplements including opioid prescriptions.  Functional ability and status Nutritional status Physical activity Advanced directives List of other physicians Hospitalizations, surgeries, and ER visits in previous 12 months Vitals Screenings to include cognitive, depression, and falls Referrals and appointments  In addition, I have reviewed and discussed with patient certain preventive protocols, quality metrics, and best practice recommendations. A written personalized care plan for preventive services as well as general preventive health recommendations were provided to patient.     Kathy Hammond, LPN   [id number]/33/[id number]   Nurse Notes: None

## 2021-05-28 NOTE — Patient Instructions (Signed)
Ms. Broman , Thank you for taking time to come for your Medicare Wellness Visit. I appreciate your ongoing commitment to your health goals. Please review the following plan we discussed and let me know if I can assist you in the future.   Screening recommendations/referrals: Colonoscopy: Done 08/25/2019 - repeat in 7 years Mammogram: Done 02/17/2020 - Keep appointment for repeat 06/06/21 Bone Density: Done 05/25/2021 - Repeat every 2 years Recommended yearly ophthalmology/optometry visit for glaucoma screening and checkup Recommended yearly dental visit for hygiene and checkup  Vaccinations: Influenza vaccine: Done 09/30/2020 - Repeat annually Pneumococcal vaccine: Done 06/19/2020 & 03/18/2013 Tdap vaccine: Done 03/21/2011 - Repeat in 10 years Shingles vaccine: Done 09/12/2018 & 01/13/2019   Covid-19: Done 02/21/2020, 03/20/2020 - Due for booster  Advanced directives: Please bring a copy of your health care power of attorney and living will to the office to be added to your chart at your convenience.  Conditions/risks identified: Aim for 30 minutes of exercise or brisk walking each day, drink 6-8 glasses of water and eat lots of fruits and vegetables.  Next appointment: Follow up in one year for your annual wellness visit    Preventive Care 65 Years and Older, Female Preventive care refers to lifestyle choices and visits with your health care provider that can promote health and wellness. What does preventive care include? A yearly physical exam. This is also called an annual well check. Dental exams once or twice a year. Routine eye exams. Ask your health care provider how often you should have your eyes checked. Personal lifestyle choices, including: Daily care of your teeth and gums. Regular physical activity. Eating a healthy diet. Avoiding tobacco and drug use. Limiting alcohol use. Practicing safe sex. Taking low-dose aspirin every day. Taking vitamin and mineral supplements as  recommended by your health care provider. What happens during an annual well check? The services and screenings done by your health care provider during your annual well check will depend on your age, overall health, lifestyle risk factors, and family history of disease. Counseling  Your health care provider may ask you questions about your: Alcohol use. Tobacco use. Drug use. Emotional well-being. Home and relationship well-being. Sexual activity. Eating habits. History of falls. Memory and ability to understand (cognition). Work and work Statistician. Reproductive health. Screening  You may have the following tests or measurements: Height, weight, and BMI. Blood pressure. Lipid and cholesterol levels. These may be checked every 5 years, or more frequently if you are over 43 years old. Skin check. Lung cancer screening. You may have this screening every year starting at age 23 if you have a 30-pack-year history of smoking and currently smoke or have quit within the past 15 years. Fecal occult blood test (FOBT) of the stool. You may have this test every year starting at age 40. Flexible sigmoidoscopy or colonoscopy. You may have a sigmoidoscopy every 5 years or a colonoscopy every 10 years starting at age 43. Hepatitis C blood test. Hepatitis B blood test. Sexually transmitted disease (STD) testing. Diabetes screening. This is done by checking your blood sugar (glucose) after you have not eaten for a while (fasting). You may have this done every 1-3 years. Bone density scan. This is done to screen for osteoporosis. You may have this done starting at age 28. Mammogram. This may be done every 1-2 years. Talk to your health care provider about how often you should have regular mammograms. Talk with your health care provider about your test results, treatment  options, and if necessary, the need for more tests. Vaccines  Your health care provider may recommend certain vaccines, such  as: Influenza vaccine. This is recommended every year. Tetanus, diphtheria, and acellular pertussis (Tdap, Td) vaccine. You may need a Td booster every 10 years. Zoster vaccine. You may need this after age 89. Pneumococcal 13-valent conjugate (PCV13) vaccine. One dose is recommended after age 74. Pneumococcal polysaccharide (PPSV23) vaccine. One dose is recommended after age 50. Talk to your health care provider about which screenings and vaccines you need and how often you need them. This information is not intended to replace advice given to you by your health care provider. Make sure you discuss any questions you have with your health care provider. Document Released: 12/22/2015 Document Revised: 08/14/2016 Document Reviewed: 09/26/2015 Elsevier Interactive Patient Education  2017 [place] Prevention in the Home Falls can cause injuries. They can happen to people of all ages. There are many things you can do to make your home safe and to help prevent falls. What can I do on the outside of my home? Regularly fix the edges of walkways and driveways and fix any cracks. Remove anything that might make you trip as you walk through a door, such as a raised step or threshold. Trim any bushes or trees on the path to your home. Use bright outdoor lighting. Clear any walking paths of anything that might make someone trip, such as rocks or tools. Regularly check to see if handrails are loose or broken. Make sure that both sides of any steps have handrails. Any raised decks and porches should have guardrails on the edges. Have any leaves, snow, or ice cleared regularly. Use sand or salt on walking paths during winter. Clean up any spills in your garage right away. This includes oil or grease spills. What can I do in the bathroom? Use night lights. Install grab bars by the toilet and in the tub and shower. Do not use towel bars as grab bars. Use non-skid mats or decals in the tub or  shower. If you need to sit down in the shower, use a plastic, non-slip stool. Keep the floor dry. Clean up any water that spills on the floor as soon as it happens. Remove soap buildup in the tub or shower regularly. Attach bath mats securely with double-sided non-slip rug tape. Do not have throw rugs and other things on the floor that can make you trip. What can I do in the bedroom? Use night lights. Make sure that you have a light by your bed that is easy to reach. Do not use any sheets or blankets that are too big for your bed. They should not hang down onto the floor. Have a firm chair that has side arms. You can use this for support while you get dressed. Do not have throw rugs and other things on the floor that can make you trip. What can I do in the kitchen? Clean up any spills right away. Avoid walking on wet floors. Keep items that you use a lot in easy-to-reach places. If you need to reach something above you, use a strong step stool that has a grab bar. Keep electrical cords out of the way. Do not use floor polish or wax that makes floors slippery. If you must use wax, use non-skid floor wax. Do not have throw rugs and other things on the floor that can make you trip. What can I do with my stairs? Do not  leave any items on the stairs. Make sure that there are handrails on both sides of the stairs and use them. Fix handrails that are broken or loose. Make sure that handrails are as long as the stairways. Check any carpeting to make sure that it is firmly attached to the stairs. Fix any carpet that is loose or worn. Avoid having throw rugs at the top or bottom of the stairs. If you do have throw rugs, attach them to the floor with carpet tape. Make sure that you have a light switch at the top of the stairs and the bottom of the stairs. If you do not have them, ask someone to add them for you. What else can I do to help prevent falls? Wear shoes that: Do not have high heels. Have  rubber bottoms. Are comfortable and fit you well. Are closed at the toe. Do not wear sandals. If you use a stepladder: Make sure that it is fully opened. Do not climb a closed stepladder. Make sure that both sides of the stepladder are locked into place. Ask someone to hold it for you, if possible. Clearly mark and make sure that you can see: Any grab bars or handrails. First and last steps. Where the edge of each step is. Use tools that help you move around (mobility aids) if they are needed. These include: Canes. Walkers. Scooters. Crutches. Turn on the lights when you go into a dark area. Replace any light bulbs as soon as they burn out. Set up your furniture so you have a clear path. Avoid moving your furniture around. If any of your floors are uneven, fix them. If there are any pets around you, be aware of where they are. Review your medicines with your doctor. Some medicines can make you feel dizzy. This can increase your chance of falling. Ask your doctor what other things that you can do to help prevent falls. This information is not intended to replace advice given to you by your health care provider. Make sure you discuss any questions you have with your health care provider. Document Released: 09/21/2009 Document Revised: 05/02/2016 Document Reviewed: 12/30/2014 Elsevier Interactive Patient Education  2017 [employer].

## 2021-05-28 NOTE — Progress Notes (Signed)
Pt aware of labs by phone

## 2021-05-28 NOTE — Progress Notes (Unsigned)
Pt aware by phone 

## 2021-05-30 ENCOUNTER — Other Ambulatory Visit: Payer: Self-pay

## 2021-05-30 ENCOUNTER — Ambulatory Visit (INDEPENDENT_AMBULATORY_CARE_PROVIDER_SITE_OTHER): Payer: Medicare Other | Admitting: *Deleted

## 2021-05-30 DIAGNOSIS — Z23 Encounter for immunization: Secondary | ICD-10-CM

## 2021-06-06 ENCOUNTER — Ambulatory Visit
Admission: RE | Admit: 2021-06-06 | Discharge: 2021-06-06 | Disposition: A | Discharge status: Home / Self Care | Payer: Medicare Other | Source: Ambulatory Visit | Attending: Family Medicine | Admitting: Family Medicine

## 2021-06-06 ENCOUNTER — Other Ambulatory Visit: Payer: Self-pay

## 2021-06-06 DIAGNOSIS — Z1231 Encounter for screening mammogram for malignant neoplasm of breast: Secondary | ICD-10-CM | POA: Diagnosis not present

## 2021-08-03 ENCOUNTER — Ambulatory Visit: Payer: Medicare Other | Admitting: Family Medicine

## 2021-08-17 ENCOUNTER — Encounter: Payer: Self-pay | Admitting: Family Medicine

## 2021-08-17 ENCOUNTER — Other Ambulatory Visit: Payer: Self-pay

## 2021-08-17 ENCOUNTER — Ambulatory Visit (INDEPENDENT_AMBULATORY_CARE_PROVIDER_SITE_OTHER): Payer: Medicare Other | Admitting: Family Medicine

## 2021-08-17 VITALS — BP 101/66 | HR 82 | Temp 97.6°F | Ht 64.0 in | Wt 175.2 lb

## 2021-08-17 DIAGNOSIS — E6609 Other obesity due to excess calories: Secondary | ICD-10-CM

## 2021-08-17 DIAGNOSIS — E781 Pure hyperglyceridemia: Secondary | ICD-10-CM | POA: Diagnosis not present

## 2021-08-17 DIAGNOSIS — Z79899 Other long term (current) drug therapy: Secondary | ICD-10-CM

## 2021-08-17 DIAGNOSIS — Z23 Encounter for immunization: Secondary | ICD-10-CM | POA: Diagnosis not present

## 2021-08-17 DIAGNOSIS — I10 Essential (primary) hypertension: Secondary | ICD-10-CM

## 2021-08-17 DIAGNOSIS — Z6830 Body mass index (BMI) 30.0-30.9, adult: Secondary | ICD-10-CM

## 2021-08-17 DIAGNOSIS — M159 Polyosteoarthritis, unspecified: Secondary | ICD-10-CM

## 2021-08-17 DIAGNOSIS — E66811 Obesity, class 1: Secondary | ICD-10-CM

## 2021-08-17 MED ORDER — HYDROCODONE-ACETAMINOPHEN 5-325 MG PO TABS: 1.0000 | ORAL_TABLET | Freq: Four times a day (QID) | ORAL | 0 refills | Status: DC | PRN

## 2021-08-17 MED ORDER — LOSARTAN POTASSIUM 100 MG PO TABS: 100.0000 mg | ORAL_TABLET | Freq: Every day | ORAL | 1 refills | Status: DC

## 2021-08-17 MED ORDER — HYDROCHLOROTHIAZIDE 25 MG PO TABS: 25.0000 mg | ORAL_TABLET | Freq: Every day | ORAL | 1 refills | Status: DC

## 2021-08-17 NOTE — Patient Instructions (Signed)

## 2021-08-17 NOTE — Progress Notes (Signed)
Established Patient Office Visit  Subjective:  Patient ID: Kathy Barr, female    DOB: 11/14/1956  Age: 65 y.o. MRN: [id number]  CC:  Chief Complaint  Patient presents with   Medical Management of Chronic Issues   Hypertension    HPI Kathy Barr presents for chronic follow up.  HTN Complaint with meds - Yes Current Medications - losartan and HCTZ  Checking BP at home ranging- hasn't been checking Exercising Regularly - occasionally as tolerated Watching Salt intake - Yes Pertinent ROS:  Headache - No Fatigue - No Visual Disturbances - No Chest pain - No Dyspnea - No Palpitations - No LE edema - No They report good compliance with medications and can restate their regimen by memory. No medication side effects.  BP Readings from Last 3 Encounters:  08/17/21 101/66  05/25/21 125/64  02/23/21 103/61   2. Generalized OA Pain assessment: Cause of pain- generalized OA Pain location- all joints, mostly shoulder and hips Pain on scale of 1-10- 8 Frequency- daily What increases pain- activity What makes pain Better-rest Effects on ADL - difficulty doing housework Any change in general medical condition-no  Current opioids rx- Norco q 6 hours prn # meds rx- 30 tablets monthly Effectiveness of current meds-very helpful Adverse reactions from pain meds- denies Morphine equivalent- 150 mme per Rx, 5 mme per day avg  Pill count performed-No Last drug screen - 02/23/21 ( high risk q70m moderate risk q627mlow risk yearly ) Urine drug screen today- No [place]  If yes were their any concerning findings? - no  Overdose risk: 140 No flowsheet data found.  Pain contract signed on: 02/23/21  3. 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 This has been stressful for her.    [facility] 08/17/2021 05/28/2021 05/25/2021  Decreased Interest 2 0 0  Down, Depressed, Hopeless 0 0 0  PHQ - 2 Score 2 0 0  Altered  sleeping 0 0 0  Tired, decreased energy 0 0 0  Change in appetite 0 0 0  Feeling bad or failure about yourself  2 0 0  Trouble concentrating 2 0 0  Moving slowly or fidgety/restless 0 0 0  Suicidal thoughts 0 0 0  PHQ-9 Score 6 0 0  Difficult doing work/chores - Not difficult at all Not difficult at all    GAD 7 : Generalized Anxiety Score 08/17/2021 05/25/2021  Nervous, Anxious, on Edge 0 0  Control/stop worrying 2 0  Worry too much - different things 2 0  Trouble relaxing 0 0  Restless 2 0  Easily annoyed or irritable 2 0  Afraid - awful might happen 2 0  Total GAD 7 Score 10 0  Anxiety Difficulty Somewhat difficult Not difficult at all     Past Medical History:  Diagnosis Date   Arthritis    "all over" (03/18/2013)   Carpal tunnel syndrome    "right" (03/18/2013)   Fracture 2013   RLE   GERD (gastroesophageal reflux disease)    OTC   Hypertension    IBS (irritable bowel syndrome)    Osteoarthritis of left hip    Osteopenia 05/28/2021   [place]iz[place] ([place]; 1978   "when I was little; when I was 7 months pregnant" (03/18/2013)   Shortness of breath    "not since I quit smoking" (03/18/2013)    Past Surgical History:  Procedure Laterality Date   ANTERIOR CERVICAL DECOMP/DISCECTOMY FUSION  ?2003  ANTERIOR CERVICAL DECOMP/DISCECTOMY FUSION N/A 04/24/2018   Procedure: C6-7 PLATE REMOVAL, A7-6, C5-6 ANTERIOR CERVICAL DECOMPRESSION/DISCECTOMY & FUSION, ALLOGRAFT, PLATE;  Surgeon: Marybelle Killings, MD;  Location: [facility];  Service: Orthopedics;  Laterality: N/A;   CHOLECYSTECTOMY  Mar 05, 2002   COLONOSCOPY N/A 02/17/2014   Procedure: COLONOSCOPY;  Surgeon: Rogene Houston, MD;  [facility];  Service: Endoscopy;  Laterality: N/A;  145-moved to 10:30 Ann to notify pt   COLONOSCOPY N/A 08/25/2019   Procedure: COLONOSCOPY;  Surgeon: Rogene Houston, MD;  [facility];  Service: Endoscopy;  Laterality: N/A;  1030   FOOT ARTHROTOMY Right 1990's   pin, after removing a  bone    JOINT REPLACEMENT Left    hip and right   pinched nerve     POLYPECTOMY  08/25/2019   Procedure: POLYPECTOMY;  Surgeon: Rogene Houston, MD;  [facility];  Service: Endoscopy;;   TOTAL HIP ARTHROPLASTY  06/30/2012   Procedure: TOTAL HIP ARTHROPLASTY;  Surgeon: Jessy Oto, MD;  Location: [facility];  Service: Orthopedics;  Laterality: Left;  Left total hip replacement with metal and polypropylene pore coated implants   TOTAL HIP ARTHROPLASTY Right 03/16/2013   Procedure: RIGHT TOTAL HIP ARTHROPLASTY- right ;  Surgeon: Jessy Oto, MD;  Location: [place];  Service: Orthopedics;  Laterality: Right;   TUBAL LIGATION  1978   VAGINAL HYSTERECTOMY  ~ 03-05-2002    Family History  Problem Relation Age of Onset   Hypertension Father    Diabetes Father    Hypertension Sister    Diabetes Sister    Hypertension Brother    Diabetes Brother    Diabetes Mother    Hypertension Mother    Hyperlipidemia Mother    COPD Brother    Heart disease Brother    Hypertension Brother     Social History   Socioeconomic History   Marital status: Divorced    Spouse name: Not on file   Number of children: 1   Years of education: 9   Highest education level: 9th grade  Occupational History    Employer: DISABLED  Tobacco Use   Smoking status: Former    Packs/day: 0.25    Years: 25.00    Pack years: 6.25    Types: Cigarettes    Quit date: 02/21/2020    Years since quitting: 1.4   Smokeless tobacco: Never   Tobacco comments:    Quit 3 months ago  Vaping Use   Vaping Use: Never used  Substance and Sexual Activity   Alcohol use: No    Alcohol/week: 0.0 standard drinks   Drug use: No   Sexual activity: Yes  Other Topics Concern   Not on file  Social History Narrative   Her significant other of 15 years passed away in 03-05-2020   She lives alone. Daughter and mother live nearby   Social Determinants of Health   Financial Resource Strain: Medium Risk   Difficulty of Paying Living  Expenses: Somewhat hard  Food Insecurity: No Food Insecurity   Worried About Charity fundraiser in the Last Year: Never true   Arboriculturist in the Last Year: Never true  Transportation Needs: No Transportation Needs   Lack of Transportation (Medical): No   Lack of Transportation (Non-Medical): No  Physical Activity: Insufficiently Active   Days of Exercise per Week: 7 days   Minutes of Exercise per Session: 10 min  Stress: No Stress Concern Present  Feeling of Stress : Not at all  Social Connections: Socially Isolated   Frequency of Communication with Friends and Family: More than three times a week   Frequency of Social Gatherings with Friends and Family: More than three times a week   Attends Religious Services: Never   Marine scientist or Organizations: No   Attends Music therapist: Never   Marital Status: Divorced  Human resources officer Violence: Not At Risk   Fear of Current or Ex-Partner: No   Emotionally Abused: No   Physically Abused: No   Sexually Abused: No    Outpatient Medications Prior to Visit  Medication Sig Dispense Refill   acetaminophen (TYLENOL) 500 MG tablet Take 1,000 mg by mouth every 6 (six) hours as needed (pain).      albuterol (VENTOLIN HFA) 108 (90 Base) MCG/ACT inhaler INHALE 2 PUFFS INTO LUNGS EVERY 6 HOURS AS NEEDED FOR WHEEZING OR SHORTNESS OF BREATH 18 g 2   Calcium Carb-Cholecalciferol (CALCIUM 600+D3 PO) Take 1 tablet by mouth 2 (two) times daily.     hydrochlorothiazide (HYDRODIURIL) 25 MG tablet Take 1 tablet (25 mg total) by mouth daily. 90 tablet 1   HYDROcodone-acetaminophen (NORCO) 5-325 MG tablet Take 1 tablet by mouth every 6 (six) hours as needed for moderate pain. 30 tablet 0   loratadine (CLARITIN) 10 MG tablet Take 10 mg by mouth daily.     losartan (COZAAR) 100 MG tablet Take 1 tablet (100 mg total) by mouth daily. 90 tablet 1   No facility-administered medications prior to visit.    No Known  Allergies  ROS Review of Systems As per HPI.    Objective:    Physical Exam Vitals and nursing note reviewed.  Constitutional:      General: She is not in acute distress.    Appearance: Normal appearance. She is not ill-appearing, toxic-appearing or diaphoretic.  HENT:     Head: Normocephalic and atraumatic.  Eyes:     Extraocular Movements: Extraocular movements intact.     Conjunctiva/sclera: Conjunctivae normal.     Pupils: Pupils are equal, round, and reactive to light.  Cardiovascular:     Rate and Rhythm: Normal rate and regular rhythm.     Pulses: Normal pulses.     Heart sounds: Normal heart sounds. No murmur heard. Pulmonary:     Effort: Pulmonary effort is normal. No respiratory distress.     Breath sounds: Normal breath sounds.  Musculoskeletal:     Right lower leg: No edema.     Left lower leg: No edema.  Skin:    General: Skin is warm and dry.  Neurological:     General: No focal deficit present.     Mental Status: She is alert and oriented to person, place, and time.  Psychiatric:        Mood and Affect: Mood normal.        Behavior: Behavior normal.    BP 101/66   Pulse 82   Temp 97.6 F (36.4 C) (Temporal)   Ht '5\' 4"'  (1.626 m)   Wt 175 lb 4 oz (79.5 kg)   BMI 30.08 kg/m  Wt Readings from Last 3 Encounters:  08/17/21 175 lb 4 oz (79.5 kg)  05/28/21 175 lb (79.4 kg)  05/25/21 180 lb 4 oz (81.8 kg)     Health Maintenance Due  Topic Date Due   PNA vac Low Risk Adult (2 of 2 - PCV13) 06/19/2021    There are no preventive  care reminders to display for this patient.  Lab Results  Component Value Date   TSH 2.180 02/05/2021   Lab Results  Component Value Date   WBC 6.3 05/25/2021   HGB 13.4 05/25/2021   HCT 39.1 05/25/2021   MCV 84 05/25/2021   PLT 185 05/25/2021   Lab Results  Component Value Date   NA 135 05/25/2021   K 3.5 05/25/2021   CO2 26 05/25/2021   GLUCOSE 83 05/25/2021   BUN 5 (L) 05/25/2021   CREATININE 0.59  05/25/2021   BILITOT 0.8 05/25/2021   ALKPHOS 87 05/25/2021   AST 18 05/25/2021   ALT 14 05/25/2021   PROT 6.6 05/25/2021   ALBUMIN 4.0 05/25/2021   CALCIUM 9.0 05/25/2021   ANIONGAP 11 04/10/2018   EGFR 99 05/25/2021   Lab Results  Component Value Date   CHOL 158 05/25/2021   Lab Results  Component Value Date   HDL 56 05/25/2021   Lab Results  Component Value Date   LDLCALC 83 05/25/2021   Lab Results  Component Value Date   TRIG 106 05/25/2021   Lab Results  Component Value Date   CHOLHDL 2.8 05/25/2021   Lab Results  Component Value Date   HGBA1C 5.1 06/19/2020      Assessment & Plan:   Lucyle was seen today for medical management of chronic issues and hypertension.  Diagnoses and all orders for this visit:  Essential hypertension, benign Well controlled on current regimen. Will do labs at next visit.  -     hydrochlorothiazide (HYDRODIURIL) 25 MG tablet; Take 1 tablet (25 mg total) by mouth daily. -     losartan (COZAAR) 100 MG tablet; Take 1 tablet (100 mg total) by mouth daily.  Controlled substance agreement signed Generalized OA CSA signed, toxassure UTD. PDMP reviewed no red flags. Refills provided.  -     HYDROcodone-acetaminophen (NORCO) 5-325 MG tablet; Take 1 tablet by mouth every 6 (six) hours as needed for moderate pain. -     HYDROcodone-acetaminophen (NORCO) 5-325 MG tablet; Take 1 tablet by mouth every 6 (six) hours as needed for moderate pain. -     HYDROcodone-acetaminophen (NORCO) 5-325 MG tablet; Take 1 tablet by mouth every 6 (six) hours as needed for moderate pain.  Hypertriglyceridemia Well controlled with diet and exercise. Will recheck lipid panel at next visit.   Class 1 obesity due to excess calories with serious comorbidity and body mass index (BMI) of 30.0 to 30.9 in adult Stable weight. Diet and exercise.   Need for vaccination -     Pneumococcal conjugate vaccine 20-valent (Prevnar 20)   Follow-up: Return in about 3  months (around 11/16/2021) for chronic follow up.   The patient indicates understanding of these issues and agrees with the plan.  Gwenlyn Perking, FNP

## 2021-09-17 ENCOUNTER — Encounter: Payer: Self-pay | Admitting: Family Medicine

## 2021-11-16 ENCOUNTER — Ambulatory Visit (INDEPENDENT_AMBULATORY_CARE_PROVIDER_SITE_OTHER): Payer: Medicare Other | Admitting: Family Medicine

## 2021-11-16 ENCOUNTER — Encounter: Payer: Self-pay | Admitting: Family Medicine

## 2021-11-16 VITALS — BP 100/63 | HR 88 | Temp 97.4°F | Ht 64.0 in | Wt 172.5 lb

## 2021-11-16 DIAGNOSIS — M159 Polyosteoarthritis, unspecified: Secondary | ICD-10-CM | POA: Diagnosis not present

## 2021-11-16 DIAGNOSIS — I10 Essential (primary) hypertension: Secondary | ICD-10-CM | POA: Diagnosis not present

## 2021-11-16 DIAGNOSIS — Z79899 Other long term (current) drug therapy: Secondary | ICD-10-CM

## 2021-11-16 DIAGNOSIS — E663 Overweight: Secondary | ICD-10-CM | POA: Diagnosis not present

## 2021-11-16 DIAGNOSIS — E781 Pure hyperglyceridemia: Secondary | ICD-10-CM | POA: Diagnosis not present

## 2021-11-16 MED ORDER — HYDROCODONE-ACETAMINOPHEN 5-325 MG PO TABS: 1.0000 | ORAL_TABLET | Freq: Four times a day (QID) | ORAL | 0 refills | Status: DC | PRN

## 2021-11-16 MED ORDER — MELOXICAM 7.5 MG PO TABS: 7.5000 mg | ORAL_TABLET | Freq: Every day | ORAL | 0 refills | Status: DC

## 2021-11-16 NOTE — Patient Instructions (Signed)
Arthritis Arthritis is a term that is commonly used to refer to joint pain or jointdisease. There are more than 100 types of arthritis. What are the causes? The most common cause of this condition is wear and tear of a joint. Other causes include: Gout. Inflammation of a joint. An infection of a joint. Sprains and other injuries near the joint. A reaction to medicines or drugs, or an allergic reaction. In some cases, the cause may not be known. What are the signs or symptoms? The main symptom of this condition is pain in the joint during movement. Other symptoms include: Redness, swelling, or stiffness at a joint. Warmth coming from the joint. Fever. Overall feeling of illness. How is this diagnosed? This condition may be diagnosed with a physical exam and tests, including: Blood tests. Urine tests. Imaging tests, such as X-rays, an MRI, or a CT scan. Sometimes, fluid is removed from a joint for testing. How is this treated? This condition may be treated with: Treatment of the cause, if it is known. Rest. Raising (elevating) the joint. Applying cold or hot packs to the joint. Medicines to improve symptoms and reduce inflammation. Injections of a steroid such as cortisone into the joint to help reduce pain and inflammation. Depending on the cause of your arthritis, you may need to make lifestyle changes to reduce stress on your joint. Changes may include: Exercising more. Losing weight. Follow these instructions at home: Medicines Take over-the-counter and prescription medicines only as told by your health care provider. Do not take aspirin to relieve pain if your health care provider thinks that gout may be causing your pain. Activity Rest your joint if told by your health care provider. Rest is important when your disease is active and your joint feels painful, swollen, or stiff. Avoid activities that make the pain worse. It is important to balance activity with  rest. Exercise your joint regularly with range-of-motion exercises as told by your health care provider. Try doing low-impact exercise, such as: Swimming. Water aerobics. Biking. Walking. Managing pain, stiffness, and swelling     If directed, put ice on the joint. Put ice in a plastic bag. Place a towel between your skin and the bag. Leave the ice on for 20 minutes, 2-3 times per day. If your joint is swollen, raise (elevate) it above the level of your heart if directed by your health care provider. If your joint feels stiff in the morning, try taking a warm shower. If directed, apply heat to the affected area as often as told by your health care provider. Use the heat source that your health care provider recommends, such as a moist heat pack or a heating pad. If you have diabetes, do not apply heat without permission from your health care provider. To apply heat: Place a towel between your skin and the heat source. Leave the heat on for 20-30 minutes. Remove the heat if your skin turns bright red. This is especially important if you are unable to feel pain, heat, or cold. You may have a greater risk of getting burned. General instructions Do not use any products that contain nicotine or tobacco, such as cigarettes, e-cigarettes, and chewing tobacco. If you need help quitting, ask your health care provider. Keep all follow-up visits as told by your health care provider. This is important. Contact a health care provider if: The pain gets worse. You have a fever. Get help right away if: You develop severe joint pain, swelling, or redness. Many joints   become painful and swollen. You develop severe back pain. You develop severe weakness in your leg. You cannot control your bladder or bowels. Summary Arthritis is a term that is commonly used to refer to joint pain or joint disease. There are more than 100 types of arthritis. The most common cause of this condition is wear and tear of a  joint. Other causes include gout, inflammation or infection of the joint, sprains, or allergies. Symptoms of this condition include redness, swelling, or stiffness of the joint. Other symptoms include warmth, fever, or feeling ill. This condition is treated with rest, elevation, medicines, and applying cold or hot packs. Follow your health care provider's instructions about medicines, activity, exercises, and other home care treatments. This information is not intended to replace advice given to you by your health care provider. Make sure you discuss any questions you have with your healthcare provider. Document Revised: 11/02/2018 Document Reviewed: 11/02/2018 Elsevier Patient Education  2022 [employer]  

## 2021-11-16 NOTE — Progress Notes (Signed)
Established Patient Office Visit  Subjective:  Patient ID: Kathy Barr, female    DOB: 10-05-1956  Age: 65 y.o. MRN: [id number]  CC:  Chief Complaint  Patient presents with   Medical Management of Chronic Issues   Hypertension    HPI Kathy MCCONAUGHY presents for chronic follow up.  HTN Complaint with meds - Yes Current Medications - losartan and HCTZ  Checking BP at home ranging- hasn't been checking Exercising Regularly - occasionally, has been fostering an infant and has been more active with this Watching Salt intake - Yes Pertinent ROS:  Headache - No Fatigue - No Visual Disturbances - No Chest pain - No Dyspnea - No Palpitations - No LE edema - No They report good compliance with medications and can restate their regimen by memory. No medication side effects.  BP Readings from Last 3 Encounters:  11/16/21 100/63  08/17/21 101/66  05/25/21 125/64   2. Generalized OA Pain assessment: Cause of pain- generalized OA Pain location- all joints, mostly shoulder and hips Pain on scale of 1-10- 8, has been a little worse lately as she is now fostering an infant and has been more active Frequency- daily What increases pain- activity What makes pain Better-rest Effects on ADL - difficulty doing housework Any change in general medical condition-no  Current opioids rx- Norco q 6 hours prn # meds rx- 30 tablets monthly Effectiveness of current meds-very helpful Adverse reactions from pain meds- denies Morphine equivalent- 131.3 mme per Rx, 4.4 mme per day avg  Pill count performed-No Last drug screen - 02/23/21 ( high risk q52m moderate risk q667mlow risk yearly ) Urine drug screen today- No [place]  If yes were their any concerning findings? - no  Overdose risk: 140 No flowsheet data found.  Pain contract signed on: 02/23/21   [facility] 11/16/2021 08/17/2021 05/28/2021  Decreased Interest 0 2 0  Down, Depressed, Hopeless 0 0 0   PHQ - 2 Score 0 2 0  Altered sleeping 0 0 0  Tired, decreased energy 0 0 0  Change in appetite 0 0 0  Feeling bad or failure about yourself  0 2 0  Trouble concentrating 0 2 0  Moving slowly or fidgety/restless 0 0 0  Suicidal thoughts 0 0 0  PHQ-9 Score 0 6 0  Difficult doing work/chores Not difficult at all - Not difficult at all  Some recent data might be hidden    GAD 7 : Generalized Anxiety Score 11/16/2021 08/17/2021 05/25/2021  Nervous, Anxious, on Edge 0 0 0  Control/stop worrying 0 2 0  Worry too much - different things 0 2 0  Trouble relaxing 0 0 0  Restless 0 2 0  Easily annoyed or irritable 0 2 0  Afraid - awful might happen 0 2 0  Total GAD 7 Score 0 10 0  Anxiety Difficulty Not difficult at all Somewhat difficult Not difficult at all     Past Medical History:  Diagnosis Date   Arthritis    "all over" (03/18/2013)   Carpal tunnel syndrome    "right" (03/18/2013)   Fracture 2013   RLE   GERD (gastroesophageal reflux disease)    OTC   Hypertension    IBS (irritable bowel syndrome)    Osteoarthritis of left hip    Osteopenia 05/28/2021   [place]   "when I was little; when I was 7 months pregnant" (03/18/2013)   Shortness  of breath    "not since I quit smoking" (03/18/2013)    Past Surgical History:  Procedure Laterality Date   ANTERIOR CERVICAL DECOMP/DISCECTOMY FUSION  ?2002/02/19   ANTERIOR CERVICAL DECOMP/DISCECTOMY FUSION N/A 04/24/2018   Procedure: C6-7 PLATE REMOVAL, P8-0, C5-6 ANTERIOR CERVICAL DECOMPRESSION/DISCECTOMY & FUSION, ALLOGRAFT, PLATE;  Surgeon: Marybelle Killings, MD;  Location: [place];  Service: Orthopedics;  Laterality: N/A;   CHOLECYSTECTOMY  2002-02-19   COLONOSCOPY N/A 02/17/2014   Procedure: COLONOSCOPY;  Surgeon: Rogene Houston, MD;  [facility];  Service: Endoscopy;  Laterality: N/A;  145-moved to 10:30 Ann to notify pt   COLONOSCOPY N/A 08/25/2019   Procedure: COLONOSCOPY;  Surgeon: Rogene Houston, MD;  [facility];  Service: Endoscopy;  Laterality: N/A;  1030   FOOT ARTHROTOMY Right 1990's   pin, after removing a bone    JOINT REPLACEMENT Left    hip and right   pinched nerve     POLYPECTOMY  08/25/2019   Procedure: POLYPECTOMY;  Surgeon: Rogene Houston, MD;  [facility];  Service: Endoscopy;;   TOTAL HIP ARTHROPLASTY  06/30/2012   Procedure: TOTAL HIP ARTHROPLASTY;  Surgeon: Jessy Oto, MD;  Location: [place];  Service: Orthopedics;  Laterality: Left;  Left total hip replacement with metal and polypropylene pore coated implants   TOTAL HIP ARTHROPLASTY Right 03/16/2013   Procedure: RIGHT TOTAL HIP ARTHROPLASTY- right ;  Surgeon: Jessy Oto, MD;  Location: [place];  Service: Orthopedics;  Laterality: Right;   TUBAL LIGATION  1978   VAGINAL HYSTERECTOMY  ~ 02-19-02    Family History  Problem Relation Age of Onset   Hypertension Father    Diabetes Father    Hypertension Sister    Diabetes Sister    Hypertension Brother    Diabetes Brother    Diabetes Mother    Hypertension Mother    Hyperlipidemia Mother    COPD Brother    Heart disease Brother    Hypertension Brother     Social History   Socioeconomic History   Marital status: Divorced    Spouse name: Not on file   Number of children: 1   Years of education: 9   Highest education level: 9th grade  Occupational History    Employer: DISABLED  Tobacco Use   Smoking status: Former    Packs/day: 0.25    Years: 25.00    Pack years: 6.25    Types: Cigarettes    Quit date: 02/21/2020    Years since quitting: 1.7   Smokeless tobacco: Never   Tobacco comments:    Quit 3 months ago  Vaping Use   Vaping Use: Never used  Substance and Sexual Activity   Alcohol use: No    Alcohol/week: 0.0 standard drinks   Drug use: No   Sexual activity: Yes  Other Topics Concern   Not on file  Social History Narrative   Her significant other of 15 years passed away in 02/20/2020   She lives alone. Daughter and mother live nearby    Social Determinants of Health   Financial Resource Strain: Medium Risk   Difficulty of Paying Living Expenses: Somewhat hard  Food Insecurity: No Food Insecurity   Worried About Charity fundraiser in the Last Year: Never true   Arboriculturist in the Last Year: Never true  Transportation Needs: No Transportation Needs   Lack of Transportation (Medical): No   Lack of Transportation (Non-Medical):  No  Physical Activity: Insufficiently Active   Days of Exercise per Week: 7 days   Minutes of Exercise per Session: 10 min  Stress: No Stress Concern Present   Feeling of Stress : Not at all  Social Connections: Socially Isolated   Frequency of Communication with Friends and Family: More than three times a week   Frequency of Social Gatherings with Friends and Family: More than three times a week   Attends Religious Services: Never   Marine scientist or Organizations: No   Attends Music therapist: Never   Marital Status: Divorced  Human resources officer Violence: Not At Risk   Fear of Current or Ex-Partner: No   Emotionally Abused: No   Physically Abused: No   Sexually Abused: No    Outpatient Medications Prior to Visit  Medication Sig Dispense Refill   acetaminophen (TYLENOL) 500 MG tablet Take 1,000 mg by mouth every 6 (six) hours as needed (pain).      albuterol (VENTOLIN HFA) 108 (90 Base) MCG/ACT inhaler INHALE 2 PUFFS INTO LUNGS EVERY 6 HOURS AS NEEDED FOR WHEEZING OR SHORTNESS OF BREATH 18 g 2   Calcium Carb-Cholecalciferol (CALCIUM 600+D3 PO) Take 1 tablet by mouth 2 (two) times daily.     hydrochlorothiazide (HYDRODIURIL) 25 MG tablet Take 1 tablet (25 mg total) by mouth daily. 90 tablet 1   HYDROcodone-acetaminophen (NORCO) 5-325 MG tablet Take 1 tablet by mouth every 6 (six) hours as needed for moderate pain. 30 tablet 0   loratadine (CLARITIN) 10 MG tablet Take 10 mg by mouth daily.     losartan (COZAAR) 100 MG tablet Take 1 tablet (100 mg total) by mouth  daily. 90 tablet 1   No facility-administered medications prior to visit.    No Known Allergies  ROS Review of Systems As per HPI.    Objective:    Physical Exam Vitals and nursing note reviewed.  Constitutional:      General: She is not in acute distress.    Appearance: Normal appearance. She is not ill-appearing, toxic-appearing or diaphoretic.  HENT:     Head: Normocephalic and atraumatic.  Eyes:     Extraocular Movements: Extraocular movements intact.     Conjunctiva/sclera: Conjunctivae normal.     Pupils: Pupils are equal, round, and reactive to light.  Cardiovascular:     Rate and Rhythm: Normal rate and regular rhythm.     Pulses: Normal pulses.     Heart sounds: Normal heart sounds. No murmur heard. Pulmonary:     Effort: Pulmonary effort is normal. No respiratory distress.     Breath sounds: Normal breath sounds.  Musculoskeletal:     Right lower leg: No edema.     Left lower leg: No edema.  Skin:    General: Skin is warm and dry.  Neurological:     General: No focal deficit present.     Mental Status: She is alert and oriented to person, place, and time.  Psychiatric:        Mood and Affect: Mood normal.        Behavior: Behavior normal.    BP 100/63   Pulse 88   Temp (!) 97.4 F (36.3 C) (Temporal)   Ht '5\' 4"'  (1.626 m)   Wt 172 lb 8 oz (78.2 kg)   BMI 29.61 kg/m  Wt Readings from Last 3 Encounters:  11/16/21 172 lb 8 oz (78.2 kg)  08/17/21 175 lb 4 oz (79.5 kg)  05/28/21 175 lb (  79.4 kg)     There are no preventive care reminders to display for this patient.   There are no preventive care reminders to display for this patient.  Lab Results  Component Value Date   TSH 2.180 02/05/2021   Lab Results  Component Value Date   WBC 6.3 05/25/2021   HGB 13.4 05/25/2021   HCT 39.1 05/25/2021   MCV 84 05/25/2021   PLT 185 05/25/2021   Lab Results  Component Value Date   NA 135 05/25/2021   K 3.5 05/25/2021   CO2 26 05/25/2021    GLUCOSE 83 05/25/2021   BUN 5 (L) 05/25/2021   CREATININE 0.59 05/25/2021   BILITOT 0.8 05/25/2021   ALKPHOS 87 05/25/2021   AST 18 05/25/2021   ALT 14 05/25/2021   PROT 6.6 05/25/2021   ALBUMIN 4.0 05/25/2021   CALCIUM 9.0 05/25/2021   ANIONGAP 11 04/10/2018   EGFR 99 05/25/2021   Lab Results  Component Value Date   CHOL 158 05/25/2021   Lab Results  Component Value Date   HDL 56 05/25/2021   Lab Results  Component Value Date   LDLCALC 83 05/25/2021   Lab Results  Component Value Date   TRIG 106 05/25/2021   Lab Results  Component Value Date   CHOLHDL 2.8 05/25/2021   Lab Results  Component Value Date   HGBA1C 5.1 06/19/2020      Assessment & Plan:   [place] was seen today for medical management of chronic issues and hypertension.  Diagnoses and all orders for this visit:  Primary hypertension Well controlled on current regimen. Labs pending.  -     CBC with Differential/Platelet -     CMP14+EGFR -     Lipid panel  Hypertriglyceridemia Labs pending.  -     CBC with Differential/Platelet -     CMP14+EGFR -     Lipid panel  Overweight (BMI 25.0-29.9) Diet and exercise. Labs pending.  -     CBC with Differential/Platelet -     CMP14+EGFR -     Lipid panel  Generalized OA Controlled substance agreement signed PDMP reviewed, no red flags. CSA and UDS are both UTD. Will add meloxicam as below.  -     HYDROcodone-acetaminophen (NORCO) 5-325 MG tablet; Take 1 tablet by mouth every 6 (six) hours as needed for moderate pain. -     HYDROcodone-acetaminophen (NORCO) 5-325 MG tablet; Take 1 tablet by mouth every 6 (six) hours as needed for moderate pain. -     HYDROcodone-acetaminophen (NORCO) 5-325 MG tablet; Take 1 tablet by mouth every 6 (six) hours as needed for moderate pain. -     meloxicam (MOBIC) 7.5 MG tablet; Take 1 tablet (7.5 mg total) by mouth daily.  Follow-up: Return in about 3 months (around 02/14/2022) for chronic follow up.   The patient  indicates understanding of these issues and agrees with the plan.  Gwenlyn Perking, FNP

## 2021-11-17 LAB — CBC WITH DIFFERENTIAL/PLATELET
Basophils Absolute: 0 10*3/uL (ref 0.0–0.2)
Basos: 1 %
EOS (ABSOLUTE): 0.1 10*3/uL (ref 0.0–0.4)
Eos: 2 %
Hematocrit: 38.7 % (ref 34.0–46.6)
Hemoglobin: 12.9 g/dL (ref 11.1–15.9)
Immature Grans (Abs): 0 10*3/uL (ref 0.0–0.1)
Immature Granulocytes: 0 %
Lymphocytes Absolute: 1.5 10*3/uL (ref 0.7–3.1)
Lymphs: 22 %
MCH: 27.7 pg (ref 26.6–33.0)
MCHC: 33.3 g/dL (ref 31.5–35.7)
MCV: 83 fL (ref 79–97)
Monocytes Absolute: 0.5 10*3/uL (ref 0.1–0.9)
Monocytes: 7 %
Neutrophils Absolute: 4.7 10*3/uL (ref 1.4–7.0)
Neutrophils: 68 %
Platelets: 231 10*3/uL (ref 150–450)
RBC: 4.66 x10E6/uL (ref 3.77–5.28)
RDW: 12.8 % (ref 11.7–15.4)
WBC: 6.9 10*3/uL (ref 3.4–10.8)

## 2021-11-17 LAB — CMP14+EGFR
ALT: 9 IU/L (ref 0–32)
AST: 16 IU/L (ref 0–40)
Albumin/Globulin Ratio: 1.6 (ref 1.2–2.2)
Albumin: 4.1 g/dL (ref 3.8–4.8)
Alkaline Phosphatase: 144 IU/L — ABNORMAL HIGH (ref 44–121)
BUN/Creatinine Ratio: 12 (ref 12–28)
BUN: 8 mg/dL (ref 8–27)
Bilirubin Total: 0.6 mg/dL (ref 0.0–1.2)
CO2: 29 mmol/L (ref 20–29)
Calcium: 9.2 mg/dL (ref 8.7–10.3)
Chloride: 100 mmol/L (ref 96–106)
Creatinine, Ser: 0.65 mg/dL (ref 0.57–1.00)
Globulin, Total: 2.5 g/dL (ref 1.5–4.5)
Glucose: 97 mg/dL (ref 70–99)
Potassium: 3.3 mmol/L — ABNORMAL LOW (ref 3.5–5.2)
Sodium: 140 mmol/L (ref 134–144)
Total Protein: 6.6 g/dL (ref 6.0–8.5)
eGFR: 97 mL/min/{1.73_m2} (ref >59)

## 2021-11-17 LAB — LIPID PANEL
Chol/HDL Ratio: 3 ratio (ref 0.0–4.4)
Cholesterol, Total: 163 mg/dL (ref 100–199)
HDL: 54 mg/dL (ref >39)
LDL Chol Calc (NIH): 87 mg/dL (ref 0–99)
Triglycerides: 122 mg/dL (ref 0–149)
VLDL Cholesterol Cal: 22 mg/dL (ref 5–40)

## 2021-11-19 ENCOUNTER — Other Ambulatory Visit: Payer: Self-pay | Admitting: Family Medicine

## 2021-11-19 DIAGNOSIS — E876 Hypokalemia: Secondary | ICD-10-CM

## 2021-11-19 MED ORDER — POTASSIUM CHLORIDE CRYS ER 20 MEQ PO TBCR: 20.0000 meq | EXTENDED_RELEASE_TABLET | Freq: Every day | ORAL | 0 refills | Status: DC

## 2021-11-20 ENCOUNTER — Other Ambulatory Visit: Payer: Self-pay | Admitting: *Deleted

## 2021-11-20 DIAGNOSIS — E876 Hypokalemia: Secondary | ICD-10-CM

## 2021-11-27 ENCOUNTER — Other Ambulatory Visit: Payer: Medicare Other

## 2021-11-27 DIAGNOSIS — E876 Hypokalemia: Secondary | ICD-10-CM | POA: Diagnosis not present

## 2021-11-27 LAB — BMP8+EGFR
BUN/Creatinine Ratio: 10 — ABNORMAL LOW (ref 12–28)
BUN: 7 mg/dL — ABNORMAL LOW (ref 8–27)
CO2: 26 mmol/L (ref 20–29)
Calcium: 9.3 mg/dL (ref 8.7–10.3)
Chloride: 100 mmol/L (ref 96–106)
Creatinine, Ser: 0.72 mg/dL (ref 0.57–1.00)
Glucose: 96 mg/dL (ref 70–99)
Potassium: 3.7 mmol/L (ref 3.5–5.2)
Sodium: 140 mmol/L (ref 134–144)
eGFR: 92 mL/min/{1.73_m2} (ref >59)

## 2022-01-17 ENCOUNTER — Other Ambulatory Visit: Payer: Self-pay | Admitting: Family Medicine

## 2022-01-17 DIAGNOSIS — M159 Polyosteoarthritis, unspecified: Secondary | ICD-10-CM

## 2022-02-15 ENCOUNTER — Ambulatory Visit (INDEPENDENT_AMBULATORY_CARE_PROVIDER_SITE_OTHER): Payer: Medicare Other | Admitting: Family Medicine

## 2022-02-15 ENCOUNTER — Encounter: Payer: Self-pay | Admitting: Family Medicine

## 2022-02-15 VITALS — BP 112/75 | HR 86 | Temp 97.0°F | Resp 20 | Ht 64.0 in | Wt 166.0 lb

## 2022-02-15 DIAGNOSIS — M5136 Other intervertebral disc degeneration, lumbar region: Secondary | ICD-10-CM | POA: Diagnosis not present

## 2022-02-15 DIAGNOSIS — I10 Essential (primary) hypertension: Secondary | ICD-10-CM

## 2022-02-15 DIAGNOSIS — J4520 Mild intermittent asthma, uncomplicated: Secondary | ICD-10-CM | POA: Diagnosis not present

## 2022-02-15 LAB — LIPID PANEL

## 2022-02-15 MED ORDER — ALBUTEROL SULFATE HFA 108 (90 BASE) MCG/ACT IN AERS: INHALATION_SPRAY | RESPIRATORY_TRACT | 2 refills | Status: AC

## 2022-02-15 MED ORDER — NALOXONE HCL 4 MG/0.1ML NA LIQD: NASAL | 3 refills | Status: AC

## 2022-02-15 MED ORDER — HYDROCODONE-ACETAMINOPHEN 5-325 MG PO TABS: 1.0000 | ORAL_TABLET | Freq: Four times a day (QID) | ORAL | 0 refills | Status: DC | PRN

## 2022-02-15 NOTE — Progress Notes (Signed)
Subjective:  Patient ID: Kathy Barr, female    DOB: [birth year], 66 y.o.   MRN: [id number]  Patient Care Team: Gwenlyn Perking, FNP as PCP - General (Family Medicine) Edythe Clarity, [facility] as Pharmacist (Pharmacist)   Chief Complaint:  Medical Management of Chronic Issues   HPI: Kathy Barr is a 66 y.o. female presenting on 02/15/2022 for Medical Management of Chronic Issues   1. Primary hypertension Complaint with meds - Yes Current Medications - cozaar, HCTZ Checking BP at home ranging 115/75 Exercising Regularly - No Watching Salt intake - Yes Pertinent ROS:  Headache - No Fatigue - No Visual Disturbances - No Chest pain - No Dyspnea - No Palpitations - No LE edema - No They report good compliance with medications and can restate their regimen by memory. No medication side effects.  BP Readings from Last 3 Encounters:  02/15/22 112/75  11/16/21 100/63  08/17/21 101/66     2. DDD (degenerative disc disease), lumbar Pain assessment: Cause of pain- DDD, OA Pain location- back, shoulders, hips Pain on scale of 1-10- 6/10 today Frequency- daily What increases pain-activity What makes pain Better-rest, medications Effects on ADL - difficult to do chores Any change in general medical condition-no  Current opioids rx- hydrocodone/acetaminophen 5/325 mg, every 6 hours PRN  # meds rx- 30 monthly Effectiveness of current meds-good Adverse reactions from pain meds-none Morphine equivalent- 20 mme/day  Pill count performed-No Last drug screen - today Urine drug screen today- Yes Was the PDMP reviewed- yes  If yes were their any concerning findings? - no   Overdose risk: low Opioid Risk  02/15/2022  Alcohol 0  Illegal Drugs 0  Rx Drugs 0  Alcohol 0  Illegal Drugs 0  Rx Drugs 0  Age between 16-45 years  0  History of Preadolescent Sexual Abuse 0  Psychological Disease 0  Depression 0  Opioid Risk Tool Scoring 0  Opioid Risk Interpretation Low  Risk   Needs to update CSA with PCP at next visit.   3. Mild intermittent asthma without complication Well controlled, does need refill on Albuterol inhaler.      Relevant past medical, surgical, family, and social history reviewed and updated as indicated.  Allergies and medications reviewed and updated. Data reviewed: Chart in Epic.   Past Medical History:  Diagnosis Date   Arthritis    "all over" (03/18/2013)   Carpal tunnel syndrome    "right" (03/18/2013)   Fracture 2013   RLE   GERD (gastroesophageal reflux disease)    OTC   Hypertension    IBS (irritable bowel syndrome)    Osteoarthritis of left hip    Osteopenia 05/28/2021   Seizures ([place]) 1960's; 1978   "when I was little; when I was 7 months pregnant" (03/18/2013)   Shortness of breath    "not since I quit smoking" (03/18/2013)    Past Surgical History:  Procedure Laterality Date   ANTERIOR CERVICAL DECOMP/DISCECTOMY FUSION  ?2003   ANTERIOR CERVICAL DECOMP/DISCECTOMY FUSION N/A 04/24/2018   Procedure: C6-7 PLATE REMOVAL, E0-7, C5-6 ANTERIOR CERVICAL DECOMPRESSION/DISCECTOMY & FUSION, ALLOGRAFT, PLATE;  Surgeon: Marybelle Killings, MD;  Location: [place];  Service: Orthopedics;  Laterality: N/A;   CHOLECYSTECTOMY  2003   COLONOSCOPY N/A 02/17/2014   Procedure: COLONOSCOPY;  Surgeon: Rogene Houston, MD;  [facility];  Service: Endoscopy;  Laterality: N/A;  145-moved to 10:30 Ann to notify pt   COLONOSCOPY N/A 08/25/2019   Procedure:  COLONOSCOPY;  Surgeon: Rogene Houston, MD;  [facility];  Service: Endoscopy;  Laterality: N/A;  1030   FOOT ARTHROTOMY Right 1990's   pin, after removing a bone    JOINT REPLACEMENT Left    hip and right   pinched nerve     POLYPECTOMY  08/25/2019   Procedure: POLYPECTOMY;  Surgeon: Rogene Houston, MD;  [facility];  Service: Endoscopy;;   TOTAL HIP ARTHROPLASTY  06/30/2012   Procedure: TOTAL HIP ARTHROPLASTY;  Surgeon: Jessy Oto, MD;  Location: [place];  Service: Orthopedics;  Laterality: Left;  Left total hip replacement with metal and polypropylene pore coated implants   TOTAL HIP ARTHROPLASTY Right 03/16/2013   Procedure: RIGHT TOTAL HIP ARTHROPLASTY- right ;  Surgeon: Jessy Oto, MD;  Location: [place];  Service: Orthopedics;  Laterality: Right;   TUBAL LIGATION  1978   VAGINAL HYSTERECTOMY  ~ 02/25/2002    Social History   Socioeconomic History   Marital status: Divorced    Spouse name: Not on file   Number of children: 1   Years of education: 9   Highest education level: 9th grade  Occupational History    Employer: DISABLED  Tobacco Use   Smoking status: Former    Packs/day: 0.25    Years: 25.00    Pack years: 6.25    Types: Cigarettes    Quit date: 02/21/2020    Years since quitting: 1.9   Smokeless tobacco: Never   Tobacco comments:    Quit 3 months ago  Vaping Use   Vaping Use: Never used  Substance and Sexual Activity   Alcohol use: No    Alcohol/week: 0.0 standard drinks   Drug use: No   Sexual activity: Yes  Other Topics Concern   Not on file  Social History Narrative   Her significant other of 15 years passed away in February 26, 2020   She lives alone. Daughter and mother live nearby   Social Determinants of Health   Financial Resource Strain: Medium Risk   Difficulty of Paying Living Expenses: Somewhat hard  Food Insecurity: No Food Insecurity   Worried About Charity fundraiser in the Last Year: Never true   Arboriculturist in the Last Year: Never true  Transportation Needs: No Transportation Needs   Lack of Transportation (Medical): No   Lack of Transportation (Non-Medical): No  Physical Activity: Insufficiently Active   Days of Exercise per Week: 7 days   Minutes of Exercise per Session: 10 min  Stress: No Stress Concern Present   Feeling of Stress : Not at all  Social Connections: Socially Isolated   Frequency of Communication with Friends and Family: More than three times a week   Frequency of Social  Gatherings with Friends and Family: More than three times a week   Attends Religious Services: Never   Marine scientist or Organizations: No   Attends Archivist Meetings: Never   Marital Status: Divorced  Human resources officer Violence: Not At Risk   Fear of Current or Ex-Partner: No   Emotionally Abused: No   Physically Abused: No   Sexually Abused: No    Outpatient Encounter Medications as of 02/15/2022  Medication Sig   Calcium Carb-Cholecalciferol (CALCIUM 600+D3 PO) Take 1 tablet by mouth 2 (two) times daily.   hydrochlorothiazide (HYDRODIURIL) 25 MG tablet Take 1 tablet (25 mg total) by mouth daily.   [START ON 03/17/2022] HYDROcodone-acetaminophen (NORCO/VICODIN) 5-325 MG tablet  Take 1 tablet by mouth every 6 (six) hours as needed for moderate pain.   HYDROcodone-acetaminophen (NORCO/VICODIN) 5-325 MG tablet Take 1 tablet by mouth every 6 (six) hours as needed for moderate pain.   loratadine (CLARITIN) 10 MG tablet Take 10 mg by mouth daily.   losartan (COZAAR) 100 MG tablet Take 1 tablet (100 mg total) by mouth daily.   meloxicam (MOBIC) 7.5 MG tablet Take 1 tablet by mouth once daily   [DISCONTINUED] acetaminophen (TYLENOL) 500 MG tablet Take 1,000 mg by mouth every 6 (six) hours as needed (pain).    [DISCONTINUED] albuterol (VENTOLIN HFA) 108 (90 Base) MCG/ACT inhaler INHALE 2 PUFFS INTO LUNGS EVERY 6 HOURS AS NEEDED FOR WHEEZING OR SHORTNESS OF BREATH   [DISCONTINUED] HYDROcodone-acetaminophen (NORCO/VICODIN) 5-325 MG tablet Take 1 tablet by mouth every 6 (six) hours as needed for moderate pain.   albuterol (VENTOLIN HFA) 108 (90 Base) MCG/ACT inhaler INHALE 2 PUFFS INTO LUNGS EVERY 6 HOURS AS NEEDED FOR WHEEZING OR SHORTNESS OF BREATH   [START ON 04/16/2022] HYDROcodone-acetaminophen (NORCO/VICODIN) 5-325 MG tablet Take 1 tablet by mouth every 6 (six) hours as needed for moderate pain.   [DISCONTINUED] potassium chloride SA (KLOR-CON M) 20 MEQ tablet Take 1 tablet (20 mEq  total) by mouth daily for 7 days. (Patient not taking: Reported on 02/15/2022)   No facility-administered encounter medications on file as of 02/15/2022.    No Known Allergies  Review of Systems  Constitutional:  Negative for activity change, appetite change, chills, diaphoresis, fatigue, fever and unexpected weight change.  HENT: Negative.    Eyes: Negative.   Respiratory:  Positive for cough and wheezing. Negative for apnea, choking, chest tightness, shortness of breath and stridor.   Cardiovascular:  Negative for chest pain, palpitations and leg swelling.  Gastrointestinal:  Negative for abdominal pain, blood in stool, constipation, diarrhea, nausea and vomiting.  Endocrine: Negative.   Genitourinary:  Negative for decreased urine volume, difficulty urinating, dysuria, frequency and urgency.  Musculoskeletal:  Positive for arthralgias and back pain. Negative for gait problem, joint swelling, myalgias, neck pain and neck stiffness.  Skin: Negative.   Allergic/Immunologic: Negative.   Neurological:  Negative for dizziness, weakness and headaches.  Hematological: Negative.   Psychiatric/Behavioral:  Negative for confusion, hallucinations, sleep disturbance and suicidal ideas.   All other systems reviewed and are negative.      Objective:  BP 112/75    Pulse 86    Temp (!) 97 F (36.1 C) (Oral)    Resp 20    Ht '5\' 4"'  (1.626 m)    Wt 166 lb (75.3 kg)    SpO2 95%    BMI 28.49 kg/m    Wt Readings from Last 3 Encounters:  02/15/22 166 lb (75.3 kg)  11/16/21 172 lb 8 oz (78.2 kg)  08/17/21 175 lb 4 oz (79.5 kg)    Physical Exam Vitals and nursing note reviewed.  Constitutional:      General: She is not in acute distress.    Appearance: Normal appearance. She is overweight. She is not ill-appearing, toxic-appearing or diaphoretic.  HENT:     Head: Normocephalic and atraumatic.  Eyes:     Pupils: Pupils are equal, round, and reactive to light.  Cardiovascular:     Rate and  Rhythm: Normal rate and regular rhythm.     Heart sounds: Normal heart sounds.  Pulmonary:     Effort: Pulmonary effort is normal.     Breath sounds: Wheezing (expiratory, minimal) present.  Musculoskeletal:        General: Tenderness (mid and lower back, no deformities or crepitus) present.     Right lower leg: No edema.     Left lower leg: No edema.  Skin:    General: Skin is warm and dry.     Capillary Refill: Capillary refill takes less than 2 seconds.  Neurological:     General: No focal deficit present.     Mental Status: She is alert and oriented to person, place, and time.  Psychiatric:        Mood and Affect: Mood normal.        Thought Content: Thought content normal.        Judgment: Judgment normal.    Results for orders placed or performed in visit on 11/27/21  BMP8+EGFR  Result Value Ref Range   Glucose 96 70 - 99 mg/dL   BUN 7 (L) 8 - 27 mg/dL   Creatinine, Ser 0.72 0.57 - 1.00 mg/dL   eGFR 92 >59 mL/min/1.73   BUN/Creatinine Ratio 10 (L) 12 - 28   Sodium 140 134 - 144 mmol/L   Potassium 3.7 3.5 - 5.2 mmol/L   Chloride 100 96 - 106 mmol/L   CO2 26 20 - 29 mmol/L   Calcium 9.3 8.7 - 10.3 mg/dL       Pertinent labs & imaging results that were available during my care of the patient were reviewed by me and considered in my medical decision making.  Assessment & Plan:  Geanie was seen today for medical management of chronic issues.  Diagnoses and all orders for this visit:  Primary hypertension BP well controlled. Changes were not made in regimen today. Goal BP is 130/80. Pt aware to report any persistent high or low readings. DASH diet and exercise encouraged. Exercise at least 150 minutes per week and increase as tolerated. Goal BMI > 25. Stress management encouraged. Avoid nicotine and tobacco product use. Avoid excessive alcohol and NSAID's. Avoid more than 2000 mg of sodium daily. Medications as prescribed. Follow up as scheduled.  -     CMP14+EGFR -      CBC with Differential/Platelet -     Lipid panel -     Thyroid Panel With TSH  DDD (degenerative disc disease), lumbar Will update toxassure today. Doing well on current regimen, will continue. Narcan sent to pharmacy.  -     HYDROcodone-acetaminophen (NORCO/VICODIN) 5-325 MG tablet; Take 1 tablet by mouth every 6 (six) hours as needed for moderate pain. -     HYDROcodone-acetaminophen (NORCO/VICODIN) 5-325 MG tablet; Take 1 tablet by mouth every 6 (six) hours as needed for moderate pain. -     HYDROcodone-acetaminophen (NORCO/VICODIN) 5-325 MG tablet; Take 1 tablet by mouth every 6 (six) hours as needed for moderate pain. -     ToxASSURE Select 13 (MW), Urine  Mild intermittent asthma without complication Doing well. Needs refill of albuterol inhaler, sent today.  -     albuterol (VENTOLIN HFA) 108 (90 Base) MCG/ACT inhaler; INHALE 2 PUFFS INTO LUNGS EVERY 6 HOURS AS NEEDED FOR WHEEZING OR SHORTNESS OF BREATH     Continue all other maintenance medications.  Follow up plan: Return in about 3 months (around 05/18/2022), or if symptoms worsen or fail to improve, for with PCP.   Continue healthy lifestyle choices, including diet (rich in fruits, vegetables, and lean proteins, and low in salt and simple carbohydrates) and exercise (at least 30 minutes of moderate physical activity daily).  Educational handout given for chronic pain  The above assessment and management plan was discussed with the patient. The patient verbalized understanding of and has agreed to the management plan. Patient is aware to call the clinic if they develop any new symptoms or if symptoms persist or worsen. Patient is aware when to return to the clinic for a follow-up visit. Patient educated on when it is appropriate to go to the emergency department.   Monia Pouch, FNP-C [facility] [phone]

## 2022-02-16 LAB — CBC WITH DIFFERENTIAL/PLATELET
Basophils Absolute: 0 10*3/uL (ref 0.0–0.2)
Basos: 0 %
EOS (ABSOLUTE): 0.1 10*3/uL (ref 0.0–0.4)
Eos: 1 %
Hematocrit: 40.5 % (ref 34.0–46.6)
Hemoglobin: 14 g/dL (ref 11.1–15.9)
Immature Grans (Abs): 0 10*3/uL (ref 0.0–0.1)
Immature Granulocytes: 0 %
Lymphocytes Absolute: 1.4 10*3/uL (ref 0.7–3.1)
Lymphs: 18 %
MCH: 28.6 pg (ref 26.6–33.0)
MCHC: 34.6 g/dL (ref 31.5–35.7)
MCV: 83 fL (ref 79–97)
Monocytes Absolute: 0.6 10*3/uL (ref 0.1–0.9)
Monocytes: 8 %
Neutrophils Absolute: 5.4 10*3/uL (ref 1.4–7.0)
Neutrophils: 73 %
Platelets: 264 10*3/uL (ref 150–450)
RBC: 4.89 x10E6/uL (ref 3.77–5.28)
RDW: 12.5 % (ref 11.7–15.4)
WBC: 7.5 10*3/uL (ref 3.4–10.8)

## 2022-02-16 LAB — CMP14+EGFR
ALT: 11 IU/L (ref 0–32)
AST: 15 IU/L (ref 0–40)
Albumin/Globulin Ratio: 1.4 (ref 1.2–2.2)
Albumin: 4.3 g/dL (ref 3.8–4.8)
Alkaline Phosphatase: 98 IU/L (ref 44–121)
BUN/Creatinine Ratio: 12 (ref 12–28)
BUN: 7 mg/dL — ABNORMAL LOW (ref 8–27)
Bilirubin Total: 0.8 mg/dL (ref 0.0–1.2)
CO2: 24 mmol/L (ref 20–29)
Calcium: 9.4 mg/dL (ref 8.7–10.3)
Chloride: 97 mmol/L (ref 96–106)
Creatinine, Ser: 0.6 mg/dL (ref 0.57–1.00)
Globulin, Total: 3.1 g/dL (ref 1.5–4.5)
Glucose: 105 mg/dL — ABNORMAL HIGH (ref 70–99)
Potassium: 3.8 mmol/L (ref 3.5–5.2)
Sodium: 139 mmol/L (ref 134–144)
Total Protein: 7.4 g/dL (ref 6.0–8.5)
eGFR: 99 mL/min/{1.73_m2} (ref >59)

## 2022-02-16 LAB — THYROID PANEL WITH TSH
Free Thyroxine Index: 2.4 (ref 1.2–4.9)
T3 Uptake Ratio: 23 % — ABNORMAL LOW (ref 24–39)
T4, Total: 10.4 ug/dL (ref 4.5–12.0)
TSH: 1.68 u[IU]/mL (ref 0.450–4.500)

## 2022-02-16 LAB — LIPID PANEL
Chol/HDL Ratio: 3 ratio (ref 0.0–4.4)
Cholesterol, Total: 173 mg/dL (ref 100–199)
HDL: 57 mg/dL (ref >39)
LDL Chol Calc (NIH): 90 mg/dL (ref 0–99)
Triglycerides: 148 mg/dL (ref 0–149)
VLDL Cholesterol Cal: 26 mg/dL (ref 5–40)

## 2022-02-21 LAB — TOXASSURE SELECT 13 (MW), URINE

## 2022-03-29 ENCOUNTER — Ambulatory Visit (INDEPENDENT_AMBULATORY_CARE_PROVIDER_SITE_OTHER): Payer: Medicare Other | Admitting: Family Medicine

## 2022-03-29 ENCOUNTER — Encounter: Payer: Self-pay | Admitting: Family Medicine

## 2022-03-29 VITALS — BP 103/65 | HR 84 | Temp 97.9°F | Ht 64.0 in | Wt 164.4 lb

## 2022-03-29 DIAGNOSIS — H60392 Other infective otitis externa, left ear: Secondary | ICD-10-CM

## 2022-03-29 MED ORDER — OFLOXACIN 0.3 % OT SOLN: 10.0000 [drp] | Freq: Every day | OTIC | 0 refills | Status: DC

## 2022-03-29 NOTE — Patient Instructions (Signed)
Otitis Externa  Otitis externa is an infection of the outer ear canal. The outer ear canal is the area between the outside of the ear and the eardrum. Otitis externa is sometimes called swimmer's ear. What are the causes? Common causes of this condition include: Swimming in dirty water. Moisture in the ear. An injury to the inside of the ear. An object stuck in the ear. A cut or scrape on the outside of the ear or in the ear canal. What increases the risk? You are more likely to develop this condition if you go swimming often. What are the signs or symptoms? The first symptom of this condition is often itching in the ear. Later symptoms of the condition include: Swelling of the ear. Redness in the ear. Ear pain. The pain may get worse when you pull on your ear. Pus coming from the ear. How is this diagnosed? This condition may be diagnosed by examining the ear and testing fluid from the ear for bacteria and funguses. How is this treated? This condition may be treated with: Antibiotic ear drops. These are often given for 10-14 days. Medicines to reduce itching and swelling. Follow these instructions at home: If you were prescribed antibiotic ear drops, use them as told by your health care provider. Do not stop using the antibiotic even if you start to feel better. Take over-the-counter and prescription medicines only as told by your health care provider. Avoid getting water in your ears as told by your health care provider. This may include avoiding swimming or water sports for a few days. Keep all follow-up visits. This is important. How is this prevented? Keep your ears dry. Use the corner of a towel to dry your ears after you swim or bathe. Avoid scratching or putting things in your ear. Doing these things can damage the ear canal or remove the protective wax that lines it, which makes it easier for bacteria and funguses to grow. Avoid swimming in lakes, polluted water, or swimming  pools that may not have enough chlorine. Contact a health care provider if: You have a fever. Your ear is still red, swollen, painful, or draining pus after 3 days. Your redness, swelling, or pain gets worse. You have a severe headache. Get help right away if: You have redness, swelling, and pain or tenderness in the area behind your ear. Summary Otitis externa is an infection of the outer ear canal. Common causes include swimming in dirty water, moisture in the ear, or a cut or scrape in the ear. Symptoms include pain, redness, and swelling of the ear canal. If you were prescribed antibiotic ear drops, use them as told by your health care provider. Do not stop using the antibiotic even if you start to feel better. This information is not intended to replace advice given to you by your health care provider. Make sure you discuss any questions you have with your health care provider. Document Revised: 02/07/2021 Document Reviewed: 02/07/2021 Elsevier Patient Education  2023 Elsevier Inc.  

## 2022-03-29 NOTE — Progress Notes (Signed)
? ?  Acute Office Visit ? ?Subjective:  ? ?  ?Patient ID: Kathy Barr, female    DOB: [birth year], 66 y.o.   MRN: [id number] ? ?Chief Complaint  ?Patient presents with  ? Otalgia  ? ? ?HPI ?Patient is in today for left ear pain x 2 weeks. The pain is on the outside of her ear. Pain is worse when she puts her finger in her ear. She denies congestion, fever, or drainage. Denies changes in hearing. She has not tried any remedies.  ? ?ROS ?As per HPI.  ? ?   ?Objective:  ?  ?BP 103/65   Pulse 84   Temp 97.9 ?F (36.6 ?C) (Temporal)   Ht '5\' 4"'$  (1.626 m)   Wt 164 lb 6 oz (74.6 kg)   BMI 28.21 kg/m?  ? ? ?Physical Exam ?Vitals and nursing note reviewed.  ?Constitutional:   ?   General: She is not in acute distress. ?   Appearance: She is not ill-appearing, toxic-appearing or diaphoretic.  ?HENT:  ?   Right Ear: Tympanic membrane, ear canal and external ear normal.  ?   Left Ear: Swelling (canal) and tenderness (canal, tragus) present. No laceration or drainage. There is no impacted cerumen. No foreign body. No mastoid tenderness. Tympanic membrane is not injected, scarred, perforated, erythematous, retracted or bulging.  ?Pulmonary:  ?   Effort: Pulmonary effort is normal. No respiratory distress.  ?Musculoskeletal:  ?   Right lower leg: No edema.  ?   Left lower leg: No edema.  ?Skin: ?   General: Skin is warm and dry.  ?Neurological:  ?   General: No focal deficit present.  ?   Mental Status: She is alert and oriented to person, place, and time.  ?Psychiatric:     ?   Mood and Affect: Mood normal.     ?   Behavior: Behavior normal.  ? ? ?No results found for any visits on 03/29/22. ? ? ?   ?Assessment & Plan:  ? ?Tahj was seen today for otalgia. ? ?Diagnoses and all orders for this visit: ? ?Other infective acute otitis externa of left ear ?Floxin drops as below. Return to office for new or worsening symptoms, or if symptoms persist.  ?-     ofloxacin (FLOXIN OTIC) 0.3 % OTIC solution; Place 10 drops into the left ear  daily for 7 days ? ? ?Return if symptoms worsen or fail to improve. ? ?The patient indicates understanding of these issues and agrees with the plan. ? ?Gwenlyn Perking, FNP ? ? ?

## 2022-04-30 ENCOUNTER — Other Ambulatory Visit: Payer: Self-pay | Admitting: Family Medicine

## 2022-04-30 DIAGNOSIS — Z1231 Encounter for screening mammogram for malignant neoplasm of breast: Secondary | ICD-10-CM

## 2022-05-16 ENCOUNTER — Encounter: Payer: Self-pay | Admitting: Family Medicine

## 2022-05-16 ENCOUNTER — Ambulatory Visit (INDEPENDENT_AMBULATORY_CARE_PROVIDER_SITE_OTHER): Payer: Medicare Other | Admitting: Family Medicine

## 2022-05-16 VITALS — BP 102/58 | HR 85 | Temp 96.4°F | Ht 64.0 in | Wt 165.0 lb

## 2022-05-16 DIAGNOSIS — R7301 Impaired fasting glucose: Secondary | ICD-10-CM | POA: Diagnosis not present

## 2022-05-16 DIAGNOSIS — M4322 Fusion of spine, cervical region: Secondary | ICD-10-CM

## 2022-05-16 DIAGNOSIS — Z79899 Other long term (current) drug therapy: Secondary | ICD-10-CM | POA: Diagnosis not present

## 2022-05-16 DIAGNOSIS — I10 Essential (primary) hypertension: Secondary | ICD-10-CM | POA: Diagnosis not present

## 2022-05-16 DIAGNOSIS — M159 Polyosteoarthritis, unspecified: Secondary | ICD-10-CM | POA: Diagnosis not present

## 2022-05-16 DIAGNOSIS — M5136 Other intervertebral disc degeneration, lumbar region: Secondary | ICD-10-CM

## 2022-05-16 DIAGNOSIS — E781 Pure hyperglyceridemia: Secondary | ICD-10-CM | POA: Diagnosis not present

## 2022-05-16 DIAGNOSIS — J4520 Mild intermittent asthma, uncomplicated: Secondary | ICD-10-CM

## 2022-05-16 LAB — BAYER DCA HB A1C WAIVED: HB A1C (BAYER DCA - WAIVED): 4.6 % — ABNORMAL LOW (ref 4.8–5.6)

## 2022-05-16 MED ORDER — HYDROCHLOROTHIAZIDE 25 MG PO TABS: 25.0000 mg | ORAL_TABLET | Freq: Every day | ORAL | 1 refills | Status: DC

## 2022-05-16 MED ORDER — HYDROCODONE-ACETAMINOPHEN 5-325 MG PO TABS: 1.0000 | ORAL_TABLET | Freq: Four times a day (QID) | ORAL | 0 refills | Status: DC | PRN

## 2022-05-16 MED ORDER — MELOXICAM 7.5 MG PO TABS: 7.5000 mg | ORAL_TABLET | Freq: Every day | ORAL | 0 refills | Status: DC

## 2022-05-16 MED ORDER — LOSARTAN POTASSIUM 50 MG PO TABS: 50.0000 mg | ORAL_TABLET | Freq: Every day | ORAL | 1 refills | Status: DC

## 2022-05-16 NOTE — Progress Notes (Signed)
Established Patient Office Visit  Subjective:  Patient ID: Kathy Barr, female    DOB: November 30, 1956  Age: 66 y.o. MRN: [id number]  CC:  Chief Complaint  Patient presents with   Medical Management of Chronic Issues   Hypertension   Pain    HPI Kathy Barr presents for chronic follow up.  HTN Complaint with meds - Yes Current Medications - losartan and HCTZ  Checking BP at home ranging- hasn't been checking Exercising Regularly - occasionally, has been fostering an infant and has been more active with this Watching Salt intake - Yes Pertinent ROS:  Headache - No Fatigue - No Visual Disturbances - No Chest pain - No Dyspnea - No Palpitations - No LE edema - No They report good compliance with medications and can restate their regimen by memory. No medication side effects.  BP Readings from Last 3 Encounters:  05/16/22 (!) 102/58  03/29/22 103/65  02/15/22 112/75   2. Generalized OA DDD Pain assessment: Cause of pain- generalized OA Pain location- all joints, mostly shoulder and hips Pain on scale of 1-10- 7-8 currently mostly in his right knee. She takes her pain medication 2x a day some days and not all other days. She also takes meloxicam and plain tylenol prn.  Frequency- daily What increases pain- activity What makes pain Better-rest Effects on ADL - difficulty doing housework Any change in general medical condition-no  Current opioids rx- Norco q 6 hours prn # meds rx- 30 tablets monthly Effectiveness of current meds-very helpful Adverse reactions from pain meds- denies Morphine equivalent- 131.3 mme per Rx, 4.4 mme per day avg  Pill count performed-No Last drug screen - 02/15/22 ( high risk q60m moderate risk q682mlow risk yearly ) Urine drug screen today- No [place]- [place]  If yes were their any concerning findings? - no  Overdose risk: 140    02/15/2022   11:22 AM  Opioid Risk   Alcohol 0  Illegal Drugs 0  Rx Drugs 0  Alcohol 0   Illegal Drugs 0  Rx Drugs 0  Age between 16-45 years  0  History of Preadolescent Sexual Abuse 0  Psychological Disease 0  Depression 0  Opioid Risk Tool Scoring 0  Opioid Risk Interpretation Low Risk   Pain contract signed on: 02/23/21      05/16/2022    9:40 AM 03/29/2022   11:07 AM 02/15/2022   10:41 AM  Depression screen PHQ 2/9  Decreased Interest 0 0 0  Down, Depressed, Hopeless 0 0 0  PHQ - 2 Score 0 0 0  Altered sleeping 0 0 0  Tired, decreased energy 0 0 0  Change in appetite 0 0 1  Feeling bad or failure about yourself  0 0 0  Trouble concentrating 0 0 0  Moving slowly or fidgety/restless 0 0 0  Suicidal thoughts 0 0 0  PHQ-9 Score 0 0 1  Difficult doing work/chores Not difficult at all Not difficult at all Not difficult at all       05/16/2022    9:41 AM 03/29/2022   11:08 AM 02/15/2022   10:42 AM 11/16/2021   10:54 AM  GAD 7 : Generalized Anxiety Score  Nervous, Anxious, on Edge 0 0 1 0  Control/stop worrying 0 0 0 0  Worry too much - different things 0 0 0 0  Trouble relaxing 3 0 1 0  Restless 3 0 0 0  Easily annoyed or irritable 0 0  0 0  Afraid - awful might happen 0 0 0 0  Total GAD 7 Score 6 0 2 0  Anxiety Difficulty Not difficult at all Not difficult at all Not difficult at all Not difficult at all     Past Medical History:  Diagnosis Date   Arthritis    "all over" (03/18/2013)   Carpal tunnel syndrome    "right" (03/18/2013)   Fracture 2013   RLE   GERD (gastroesophageal reflux disease)    OTC   Hypertension    IBS (irritable bowel syndrome)    Osteoarthritis of left hip    Osteopenia 05/28/2021   Seizures ([place]) 1960's; 1978   "when I was little; when I was 7 months pregnant" (03/18/2013)   Shortness of breath    "not since I quit smoking" (03/18/2013)    Past Surgical History:  Procedure Laterality Date   ANTERIOR CERVICAL DECOMP/DISCECTOMY FUSION  ?2003   ANTERIOR CERVICAL DECOMP/DISCECTOMY FUSION N/A 04/24/2018   Procedure: C6-7 PLATE  REMOVAL, I4-3, C5-6 ANTERIOR CERVICAL DECOMPRESSION/DISCECTOMY & FUSION, ALLOGRAFT, PLATE;  Surgeon: Marybelle Killings, MD;  Location: [facility];  Service: Orthopedics;  Laterality: N/A;   CHOLECYSTECTOMY  2003   COLONOSCOPY N/A 02/17/2014   Procedure: COLONOSCOPY;  Surgeon: Rogene Houston, MD;  [facility];  Service: Endoscopy;  Laterality: N/A;  145-moved to 10:30 Ann to notify pt   COLONOSCOPY N/A 08/25/2019   Procedure: COLONOSCOPY;  Surgeon: Rogene Houston, MD;  Location: [facility];  Service: Endoscopy;  Laterality: N/A;  1030   FOOT ARTHROTOMY Right 1990's   pin, after removing a bone    JOINT REPLACEMENT Left    hip and right   pinched nerve     POLYPECTOMY  08/25/2019   Procedure: POLYPECTOMY;  Surgeon: Rogene Houston, MD;  [facility];  Service: Endoscopy;;   TOTAL HIP ARTHROPLASTY  06/30/2012   Procedure: TOTAL HIP ARTHROPLASTY;  Surgeon: Jessy Oto, MD;  Location: [facility];  Service: Orthopedics;  Laterality: Left;  Left total hip replacement with metal and polypropylene pore coated implants   TOTAL HIP ARTHROPLASTY Right 03/16/2013   Procedure: RIGHT TOTAL HIP ARTHROPLASTY- right ;  Surgeon: Jessy Oto, MD;  Location: [facility];  Service: Orthopedics;  Laterality: Right;   TUBAL LIGATION  1978   VAGINAL HYSTERECTOMY  ~ 2003    Family History  Problem Relation Age of Onset   Hypertension Father    Diabetes Father    Hypertension Sister    Diabetes Sister    Hypertension Brother    Diabetes Brother    Diabetes Mother    Hypertension Mother    Hyperlipidemia Mother    COPD Brother    Heart disease Brother    Hypertension Brother     Social History   Socioeconomic History   Marital status: Divorced    Spouse name: Not on file   Number of children: 1   Years of education: 9   Highest education level: 9th grade  Occupational History    Employer: DISABLED  Tobacco Use   Smoking status: Former    Packs/day: 0.25    Years: 25.00    Total pack  years: 6.25    Types: Cigarettes    Quit date: 02/21/2020    Years since quitting: 2.2   Smokeless tobacco: Never   Tobacco comments:    Quit 3 months ago  Vaping Use   Vaping Use: Never used  Substance and Sexual  Activity   Alcohol use: No    Alcohol/week: 0.0 standard drinks of alcohol   Drug use: No   Sexual activity: Yes  Other Topics Concern   Not on file  Social History Narrative   Her significant other of 15 years passed away in 2020/02/13   She lives alone. Daughter and mother live nearby   Social Determinants of Health   Financial Resource Strain: Medium Risk (05/28/2021)   Overall Financial Resource Strain (CARDIA)    Difficulty of Paying Living Expenses: Somewhat hard  Food Insecurity: No Food Insecurity (05/28/2021)   Hunger Vital Sign    Worried About Running Out of Food in the Last Year: Never true    Ran Out of Food in the Last Year: Never true  Transportation Needs: No Transportation Needs (05/28/2021)   PRAPARE - Hydrologist (Medical): No    Lack of Transportation (Non-Medical): No  Physical Activity: Insufficiently Active (05/28/2021)   Exercise Vital Sign    Days of Exercise per Week: 7 days    Minutes of Exercise per Session: 10 min  Stress: No Stress Concern Present (05/28/2021)   [place]    Feeling of Stress : Not at all  Social Connections: Socially Isolated (05/28/2021)   Social Connection and Isolation Panel [NHANES]    Frequency of Communication with Friends and Family: More than three times a week    Frequency of Social Gatherings with Friends and Family: More than three times a week    Attends Religious Services: Never    Marine scientist or Organizations: No    Attends Archivist Meetings: Never    Marital Status: Divorced  Human resources officer Violence: Not At Risk (05/28/2021)   Humiliation, Afraid, Rape, and Kick questionnaire    Fear of  Current or Ex-Partner: No    Emotionally Abused: No    Physically Abused: No    Sexually Abused: No    Outpatient Medications Prior to Visit  Medication Sig Dispense Refill   albuterol (VENTOLIN HFA) 108 (90 Base) MCG/ACT inhaler INHALE 2 PUFFS INTO LUNGS EVERY 6 HOURS AS NEEDED FOR WHEEZING OR SHORTNESS OF BREATH 18 g 2   Calcium Carb-Cholecalciferol (CALCIUM 600+D3 PO) Take 1 tablet by mouth 2 (two) times daily.     hydrochlorothiazide (HYDRODIURIL) 25 MG tablet Take 1 tablet (25 mg total) by mouth daily. 90 tablet 1   HYDROcodone-acetaminophen (NORCO/VICODIN) 5-325 MG tablet Take 1 tablet by mouth every 6 (six) hours as needed for moderate pain. 30 tablet 0   HYDROcodone-acetaminophen (NORCO/VICODIN) 5-325 MG tablet Take 1 tablet by mouth every 6 (six) hours as needed for moderate pain. 30 tablet 0   HYDROcodone-acetaminophen (NORCO/VICODIN) 5-325 MG tablet Take 1 tablet by mouth every 6 (six) hours as needed for moderate pain. 30 tablet 0   loratadine (CLARITIN) 10 MG tablet Take 10 mg by mouth daily.     losartan (COZAAR) 100 MG tablet Take 1 tablet (100 mg total) by mouth daily. 90 tablet 1   meloxicam (MOBIC) 7.5 MG tablet Take 1 tablet by mouth once daily 90 tablet 0   naloxone (NARCAN) nasal spray 4 mg/0.1 mL Nasally for overdose 1 each 3   No facility-administered medications prior to visit.    No Known Allergies  ROS Review of Systems As per HPI.    Objective:    Physical Exam Vitals and nursing note reviewed.  Constitutional:  General: She is not in acute distress.    Appearance: Normal appearance. She is not ill-appearing, toxic-appearing or diaphoretic.  HENT:     Head: Normocephalic and atraumatic.  Eyes:     Extraocular Movements: Extraocular movements intact.     Conjunctiva/sclera: Conjunctivae normal.     Pupils: Pupils are equal, round, and reactive to light.  Cardiovascular:     Rate and Rhythm: Normal rate and regular rhythm.     Pulses: Normal  pulses.     Heart sounds: Normal heart sounds. No murmur heard. Pulmonary:     Effort: Pulmonary effort is normal. No respiratory distress.     Breath sounds: Normal breath sounds.  Musculoskeletal:     Right lower leg: No edema.     Left lower leg: No edema.  Skin:    General: Skin is warm and dry.  Neurological:     General: No focal deficit present.     Mental Status: She is alert and oriented to person, place, and time.  Psychiatric:        Mood and Affect: Mood normal.        Behavior: Behavior normal.     BP (!) 102/58   Pulse 85   Temp (!) 96.4 F (35.8 C) (Temporal)   Ht '5\' 4"'  (1.626 m)   Wt 165 lb (74.8 kg)   SpO2 97%   BMI 28.32 kg/m  Wt Readings from Last 3 Encounters:  05/16/22 165 lb (74.8 kg)  03/29/22 164 lb 6 oz (74.6 kg)  02/15/22 166 lb (75.3 kg)     There are no preventive care reminders to display for this patient.   There are no preventive care reminders to display for this patient.  Lab Results  Component Value Date   TSH 1.680 02/15/2022   Lab Results  Component Value Date   WBC 7.5 02/15/2022   HGB 14.0 02/15/2022   HCT 40.5 02/15/2022   MCV 83 02/15/2022   PLT 264 02/15/2022   Lab Results  Component Value Date   NA 139 02/15/2022   K 3.8 02/15/2022   CO2 24 02/15/2022   GLUCOSE 105 (H) 02/15/2022   BUN 7 (L) 02/15/2022   CREATININE 0.60 02/15/2022   BILITOT 0.8 02/15/2022   ALKPHOS 98 02/15/2022   AST 15 02/15/2022   ALT 11 02/15/2022   PROT 7.4 02/15/2022   ALBUMIN 4.3 02/15/2022   CALCIUM 9.4 02/15/2022   ANIONGAP 11 04/10/2018   EGFR 99 02/15/2022   Lab Results  Component Value Date   CHOL 173 02/15/2022   Lab Results  Component Value Date   HDL 57 02/15/2022   Lab Results  Component Value Date   LDLCALC 90 02/15/2022   Lab Results  Component Value Date   TRIG 148 02/15/2022   Lab Results  Component Value Date   CHOLHDL 3.0 02/15/2022   Lab Results  Component Value Date   HGBA1C 5.1 06/19/2020       Assessment & Plan:   Kathy Barr was seen today for medical management of chronic issues, hypertension and pain.  Diagnoses and all orders for this visit:  Primary hypertension BP is soft again. Will decrease losartan today. Continue HCTZ. Fasting labs pending. She will monitor BP at home and notify for elevated readings.  -     hydrochlorothiazide (HYDRODIURIL) 25 MG tablet; Take 1 tablet (25 mg total) by mouth daily. -     losartan (COZAAR) 50 MG tablet; Take 1 tablet (50 mg total)  by mouth daily. -     CBC with Differential/Platelet -     CMP14+EGFR  Hypertriglyceridemia Diet and exercise. Labs pending.  -     hydrochlorothiazide (HYDRODIURIL) 25 MG tablet; Take 1 tablet (25 mg total) by mouth daily. -     CBC with Differential/Platelet -     CMP14+EGFR  Generalized OA DDD (degenerative disc disease), lumbar Controlled substance agreement signed PDMP reviewed, no red flags. UDS is UTD. CSA resigned today. New referral to ortho placed today.  -     Ambulatory referral to Orthopedic Surgery -     HYDROcodone-acetaminophen (NORCO/VICODIN) 5-325 MG tablet; Take 1 tablet by mouth every 6 (six) hours as needed for moderate pain. -     HYDROcodone-acetaminophen (NORCO/VICODIN) 5-325 MG tablet; Take 1 tablet by mouth every 6 (six) hours as needed for moderate pain. -     HYDROcodone-acetaminophen (NORCO/VICODIN) 5-325 MG tablet; Take 1 tablet by mouth every 6 (six) hours as needed for moderate pain.  Elevated fasting blood sugar -     Bayer DCA Hb A1c Waived  Mild intermittent asthma without complication Well controlled on current regimen.    Follow-up: Return in about 3 months (around 08/16/2022) for chronic follow up.   The patient indicates understanding of these issues and agrees with the plan.  Gwenlyn Perking, FNP

## 2022-05-16 NOTE — Patient Instructions (Signed)
Osteoarthritis  Osteoarthritis is a type of arthritis. It refers to joint pain or joint disease. Osteoarthritis affects tissue that covers the ends of bones in joints (cartilage). Cartilage acts as a cushion between the bones and helps them move smoothly. Osteoarthritis occurs when cartilage in the joints gets worn down. Osteoarthritis is sometimes called "wear and tear" arthritis. Osteoarthritis is the most common form of arthritis. It often occurs in older people. It is a condition that gets worse over time. The joints most often affected by this condition are in the fingers, toes, hips, knees, and spine, including the neck and lower back. What are the causes? This condition is caused by the wearing down of cartilage that covers the ends of bones. What increases the risk? The following factors may make you more likely to develop this condition: Being age 50 or older. Obesity. Overuse of joints. Past injury of a joint. Past surgery on a joint. Family history of osteoarthritis. What are the signs or symptoms? The main symptoms of this condition are pain, swelling, and stiffness in the joint. Other symptoms may include: An enlarged joint. More pain and further damage caused by small pieces of bone or cartilage that break off and float inside of the joint. Small deposits of bone (osteophytes) that grow on the edges of the joint. A grating or scraping feeling inside the joint when you move it. Popping or creaking sounds when you move. Difficulty walking or exercising. An inability to grip items, twist your hand(s), or control the movements of your hands and fingers. How is this diagnosed? This condition may be diagnosed based on: Your medical history. A physical exam. Your symptoms. X-rays of the affected joint(s). Blood tests to rule out other types of arthritis. How is this treated? There is no cure for this condition, but treatment can help control pain and improve joint function.  Treatment may include a combination of therapies, such as: Pain relief techniques, such as: Applying heat and cold to the joint. Massage. A form of talk therapy called cognitive behavioral therapy (CBT). This therapy helps you set goals and follow up on the changes that you make. Medicines for pain and inflammation. The medicines can be taken by mouth or applied to the skin. They include: NSAIDs, such as ibuprofen. Prescription medicines. Strong anti-inflammatory medicines (corticosteroids). Certain nutritional supplements. A prescribed exercise program. You may work with a physical therapist. Assistive devices, such as a brace, wrap, splint, specialized glove, or cane. A weight control plan. Surgery, such as: An osteotomy. This is done to reposition the bones and relieve pain or to remove loose pieces of bone and cartilage. Joint replacement surgery. You may need this surgery if you have advanced osteoarthritis. Follow these instructions at home: Activity Rest your affected joints as told by your health care provider. Exercise as told by your health care provider. He or she may recommend specific types of exercise, such as: Strengthening exercises. These are done to strengthen the muscles that support joints affected by arthritis. Aerobic activities. These are exercises, such as brisk walking or water aerobics, that increase your heart rate. Range-of-motion activities. These help your joints move more easily. Balance and agility exercises. Managing pain, stiffness, and swelling     If directed, apply heat to the affected area as often as told by your health care provider. Use the heat source that your health care provider recommends, such as a moist heat pack or a heating pad. If you have a removable assistive device, remove it   as told by your health care provider. Place a towel between your skin and the heat source. If your health care provider tells you to keep the assistive device  on while you apply heat, place a towel between the assistive device and the heat source. Leave the heat on for 20-30 minutes. Remove the heat if your skin turns bright red. This is especially important if you are unable to feel pain, heat, or cold. You may have a greater risk of getting burned. If directed, put ice on the affected area. To do this: If you have a removable assistive device, remove it as told by your health care provider. Put ice in a plastic bag. Place a towel between your skin and the bag. If your health care provider tells you to keep the assistive device on during icing, place a towel between the assistive device and the bag. Leave the ice on for 20 minutes, 2-3 times a day. Move your fingers or toes often to reduce stiffness and swelling. Raise (elevate) the injured area above the level of your heart while you are sitting or lying down. General instructions Take over-the-counter and prescription medicines only as told by your health care provider. Maintain a healthy weight. Follow instructions from your health care provider for weight control. Do not use any products that contain nicotine or tobacco, such as cigarettes, e-cigarettes, and chewing tobacco. If you need help quitting, ask your health care provider. Use assistive devices as told by your health care provider. Keep all follow-up visits as told by your health care provider. This is important. Where to find more information National Institute of Arthritis and Musculoskeletal and Skin Diseases: www.niams.nih.gov National Institute on Aging: www.nia.nih.gov American College of Rheumatology: www.rheumatology.org Contact a health care provider if: You have redness, swelling, or a feeling of warmth in a joint that gets worse. You have a fever along with joint or muscle aches. You develop a rash. You have trouble doing your normal activities. Get help right away if: You have pain that gets worse and is not relieved by  pain medicine. Summary Osteoarthritis is a type of arthritis that affects tissue covering the ends of bones in joints (cartilage). This condition is caused by the wearing down of cartilage that covers the ends of bones. The main symptom of this condition is pain, swelling, and stiffness in the joint. There is no cure for this condition, but treatment can help control pain and improve joint function. This information is not intended to replace advice given to you by your health care provider. Make sure you discuss any questions you have with your health care provider. Document Revised: 11/22/2019 Document Reviewed: 11/22/2019 Elsevier Patient Education  2023 [employer]  

## 2022-05-17 LAB — CBC WITH DIFFERENTIAL/PLATELET
Basophils Absolute: 0 10*3/uL (ref 0.0–0.2)
Basos: 0 %
EOS (ABSOLUTE): 0.1 10*3/uL (ref 0.0–0.4)
Eos: 2 %
Hematocrit: 41.4 % (ref 34.0–46.6)
Hemoglobin: 13.8 g/dL (ref 11.1–15.9)
Immature Grans (Abs): 0 10*3/uL (ref 0.0–0.1)
Immature Granulocytes: 0 %
Lymphocytes Absolute: 1.2 10*3/uL (ref 0.7–3.1)
Lymphs: 27 %
MCH: 27.9 pg (ref 26.6–33.0)
MCHC: 33.3 g/dL (ref 31.5–35.7)
MCV: 84 fL (ref 79–97)
Monocytes Absolute: 0.4 10*3/uL (ref 0.1–0.9)
Monocytes: 9 %
Neutrophils Absolute: 2.8 10*3/uL (ref 1.4–7.0)
Neutrophils: 62 %
Platelets: 235 10*3/uL (ref 150–450)
RBC: 4.95 x10E6/uL (ref 3.77–5.28)
RDW: 13 % (ref 11.7–15.4)
WBC: 4.6 10*3/uL (ref 3.4–10.8)

## 2022-05-17 LAB — CMP14+EGFR
ALT: 10 IU/L (ref 0–32)
AST: 17 IU/L (ref 0–40)
Albumin/Globulin Ratio: 1.4 (ref 1.2–2.2)
Albumin: 4.3 g/dL (ref 3.8–4.8)
Alkaline Phosphatase: 93 IU/L (ref 44–121)
BUN/Creatinine Ratio: 9 — ABNORMAL LOW (ref 12–28)
BUN: 6 mg/dL — ABNORMAL LOW (ref 8–27)
Bilirubin Total: 0.7 mg/dL (ref 0.0–1.2)
CO2: 26 mmol/L (ref 20–29)
Calcium: 9.5 mg/dL (ref 8.7–10.3)
Chloride: 100 mmol/L (ref 96–106)
Creatinine, Ser: 0.64 mg/dL (ref 0.57–1.00)
Globulin, Total: 3 g/dL (ref 1.5–4.5)
Glucose: 91 mg/dL (ref 70–99)
Potassium: 3.3 mmol/L — ABNORMAL LOW (ref 3.5–5.2)
Sodium: 141 mmol/L (ref 134–144)
Total Protein: 7.3 g/dL (ref 6.0–8.5)
eGFR: 97 mL/min/{1.73_m2} (ref >59)

## 2022-05-23 ENCOUNTER — Ambulatory Visit (INDEPENDENT_AMBULATORY_CARE_PROVIDER_SITE_OTHER): Payer: Medicare Other | Admitting: Orthopaedic Surgery

## 2022-05-23 ENCOUNTER — Encounter: Payer: Self-pay | Admitting: Orthopaedic Surgery

## 2022-05-23 DIAGNOSIS — M2241 Chondromalacia patellae, right knee: Secondary | ICD-10-CM | POA: Diagnosis not present

## 2022-05-23 NOTE — Progress Notes (Signed)
Office Visit Note   Patient: Kathy Barr           Date of Birth: Jun 06, 1956           MRN: [id number] Visit Date: 05/23/2022              Requested by: Gwenlyn Perking, [place],  [place] [address] PCP: Gwenlyn Perking, FNP   Assessment & Plan: Visit Diagnoses:  1. Chondromalacia patellae, right knee     Plan: Patient can use some Aspercreme we discussed activity modification.  Taking care of her 67-year-old she has had more activity and this is aggravated chondromalacia patella.  If she starts developing symptoms of knee effusion or locking she can return to consider injection.  We discussed childcare modifications will help decrease patellofemoral load.  Follow-Up Instructions: No follow-ups on file.   Orders:  No orders of the defined types were placed in this encounter.  No orders of the defined types were placed in this encounter.     Procedures: No procedures performed   Clinical Data: No additional findings.   Subjective: Chief Complaint  Patient presents with   Right Leg - Pain    I have pain radiating down to my foot, it is a throbbing pain sometimes and its been going on now for about 2 weeks   Right Knee - Pain    It hurts in the back of my knee    HPI since I last saw her 2 years ago patient lost her fianc who died of heart failure with bilateral leg amputations.  She states she is now taking care of his grandchild who is 47 years old.  She has had to do a lot of bending walking activities and said increased pain in her right knee without swelling but with knee extension or stairs she has noticed a loud pop in her knee.  She is also started on hydrocodone with pain contract.  Review of Systems previous cervical spine fusion right left total hip arthroplasty is doing well.   Objective: Vital Signs: Ht '5\' 5"'$  (1.651 m)   Wt 167 lb 6 oz (75.9 kg)   BMI 27.85 kg/m   Physical Exam Constitutional:      Appearance: She is  well-developed.  HENT:     Head: Normocephalic.     Right Ear: External ear normal.     Left Ear: External ear normal. There is no impacted cerumen.  Eyes:     Pupils: Pupils are equal, round, and reactive to light.  Neck:     Thyroid: No thyromegaly.     Trachea: No tracheal deviation.  Cardiovascular:     Rate and Rhythm: Normal rate.  Pulmonary:     Effort: Pulmonary effort is normal.  Abdominal:     Palpations: Abdomen is soft.  Musculoskeletal:     Cervical back: No rigidity.  Skin:    General: Skin is warm and dry.  Neurological:     Mental Status: She is alert and oriented to person, place, and time.  Psychiatric:        Behavior: Behavior normal.     Ortho Exam patient is able demonstrate a pop with knee extension pain with patellofemoral loading negative apprehension ACL PCL is normal no medial plica.  No significant joint line tenderness.  Specialty Comments:  No specialty comments available.  Imaging: No results found.   PMFS History: Patient Active Problem List   Diagnosis Date Noted  Chondromalacia patellae, right knee 05/23/2022   Mild intermittent asthma without complication 59/56/3875   Osteopenia 05/28/2021   Controlled substance agreement signed 02/23/2021   Hx of colonic polyps 02/15/2019   Fusion of spine of cervical region 04/24/2018   Other spondylosis with radiculopathy, cervical region 03/05/2018   Hypertriglyceridemia 08/27/2017   Vertigo 02/04/2017   Atrophic vulvovaginitis 06/05/2015   DDD (degenerative disc disease), lumbar 01/13/2015   IBS (irritable bowel syndrome) 07/12/2014   Osteoarthritis of right hip 03/16/2013    Class: Chronic   Generalized OA 10/29/2012   Primary hypertension 10/29/2012   Past Medical History:  Diagnosis Date   Arthritis    "all over" (03/18/2013)   Carpal tunnel syndrome    "right" (03/18/2013)   Fracture 2013   RLE   GERD (gastroesophageal reflux disease)    OTC   Hypertension    IBS (irritable  bowel syndrome)    Osteoarthritis of left hip    Osteopenia 05/28/2021   Seizures ([place]) 1960's; 1978   "when I was little; when I was 7 months pregnant" (03/18/2013)   Shortness of breath    "not since I quit smoking" (03/18/2013)    Family History  Problem Relation Age of Onset   Hypertension Father    Diabetes Father    Hypertension Sister    Diabetes Sister    Hypertension Brother    Diabetes Brother    Diabetes Mother    Hypertension Mother    Hyperlipidemia Mother    COPD Brother    Heart disease Brother    Hypertension Brother     Past Surgical History:  Procedure Laterality Date   ANTERIOR CERVICAL DECOMP/DISCECTOMY FUSION  ?2003   ANTERIOR CERVICAL DECOMP/DISCECTOMY FUSION N/A 04/24/2018   Procedure: C6-7 PLATE REMOVAL, I4-3, C5-6 ANTERIOR CERVICAL DECOMPRESSION/DISCECTOMY & FUSION, ALLOGRAFT, PLATE;  Surgeon: Marybelle Killings, MD;  Location: [facility];  Service: Orthopedics;  Laterality: N/A;   CHOLECYSTECTOMY  2003   COLONOSCOPY N/A 02/17/2014   Procedure: COLONOSCOPY;  Surgeon: Rogene Houston, MD;  [facility];  Service: Endoscopy;  Laterality: N/A;  145-moved to 10:30 Ann to notify pt   COLONOSCOPY N/A 08/25/2019   Procedure: COLONOSCOPY;  Surgeon: Rogene Houston, MD;  [facility];  Service: Endoscopy;  Laterality: N/A;  1030   FOOT ARTHROTOMY Right 1990's   pin, after removing a bone    JOINT REPLACEMENT Left    hip and right   pinched nerve     POLYPECTOMY  08/25/2019   Procedure: POLYPECTOMY;  Surgeon: Rogene Houston, MD;  [facility];  Service: Endoscopy;;   TOTAL HIP ARTHROPLASTY  06/30/2012   Procedure: TOTAL HIP ARTHROPLASTY;  Surgeon: Jessy Oto, MD;  Location: [facility];  Service: Orthopedics;  Laterality: Left;  Left total hip replacement with metal and polypropylene pore coated implants   TOTAL HIP ARTHROPLASTY Right 03/16/2013   Procedure: RIGHT TOTAL HIP ARTHROPLASTY- right ;  Surgeon: Jessy Oto, MD;  Location: [facility];   Service: Orthopedics;  Laterality: Right;   TUBAL LIGATION  1978   VAGINAL HYSTERECTOMY  ~ 2003   Social History   Occupational History    Employer: DISABLED  Tobacco Use   Smoking status: Former    Packs/day: 0.25    Years: 25.00    Total pack years: 6.25    Types: Cigarettes    Quit date: 02/21/2020    Years since quitting: 2.2   Smokeless tobacco: Never   Tobacco  comments:    Quit 3 months ago  Vaping Use   Vaping Use: Never used  Substance and Sexual Activity   Alcohol use: No    Alcohol/week: 0.0 standard drinks of alcohol   Drug use: No   Sexual activity: Yes

## 2022-05-29 ENCOUNTER — Ambulatory Visit (INDEPENDENT_AMBULATORY_CARE_PROVIDER_SITE_OTHER): Payer: Medicare Other

## 2022-05-29 VITALS — BP 117/78 | Wt 167.0 lb

## 2022-05-29 DIAGNOSIS — Z0000 Encounter for general adult medical examination without abnormal findings: Secondary | ICD-10-CM | POA: Diagnosis not present

## 2022-05-29 NOTE — Progress Notes (Signed)
Subjective:   Kathy Barr is a 66 y.o. female who presents for Medicare Annual (Subsequent) preventive examination.  Virtual Visit via Telephone Note  I connected with  [name] on 05/29/22 at  3:30 PM EDT by telephone and verified that I am speaking with the correct person using two identifiers.  Location: Patient: Home Provider: [facility] Persons participating in the virtual visit: patient/Nurse Health Advisor   I discussed the limitations, risks, security and privacy concerns of performing an evaluation and management service by telephone and the availability of in person appointments. The patient expressed understanding and agreed to proceed.  Interactive audio and video telecommunications were attempted between this nurse and patient, however failed, due to patient having technical difficulties OR patient did not have access to video capability.  We continued and completed visit with audio only.  Some vital signs may be absent or patient reported.   Zyair Rhein E Akim Watkinson, LPN   Review of Systems     Cardiac Risk Factors include: advanced age (>87mn, >>42women);dyslipidemia;hypertension;sedentary lifestyle     Objective:    Today's Vitals   05/29/22 1534  BP: 117/78  Weight: 167 lb (75.8 kg)   Body mass index is 27.79 kg/m.     05/29/2022    3:44 PM 05/28/2021    3:52 PM 08/25/2019    7:44 AM 12/28/2018    9:39 AM 05/02/2018   10:50 PM 04/24/2018    1:55 PM 04/10/2018   12:55 PM  Advanced Directives  Does Patient Have a Medical Advance Directive? Yes No No No No No No  Type of AParamedicof ADumasLiving will        Copy of H[place]in Chart? No - copy requested        Would patient like information on creating a medical advance directive?  No - Patient declined No - Patient declined No - Patient declined  No - Patient declined No - Patient declined    Current Medications (verified) Outpatient Encounter Medications as of  05/29/2022  Medication Sig   albuterol (VENTOLIN HFA) 108 (90 Base) MCG/ACT inhaler INHALE 2 PUFFS INTO LUNGS EVERY 6 HOURS AS NEEDED FOR WHEEZING OR SHORTNESS OF BREATH   Calcium Carb-Cholecalciferol (CALCIUM 600+D3 PO) Take 1 tablet by mouth 2 (two) times daily.   hydrochlorothiazide (HYDRODIURIL) 25 MG tablet Take 1 tablet (25 mg total) by mouth daily.   HYDROcodone-acetaminophen (NORCO/VICODIN) 5-325 MG tablet Take 1 tablet by mouth every 6 (six) hours as needed for moderate pain.   loratadine (CLARITIN) 10 MG tablet Take 10 mg by mouth daily.   losartan (COZAAR) 50 MG tablet Take 1 tablet (50 mg total) by mouth daily.   meloxicam (MOBIC) 7.5 MG tablet Take 1 tablet (7.5 mg total) by mouth daily.   [START ON 06/14/2022] HYDROcodone-acetaminophen (NORCO/VICODIN) 5-325 MG tablet Take 1 tablet by mouth every 6 (six) hours as needed for moderate pain.   [START ON 07/15/2022] HYDROcodone-acetaminophen (NORCO/VICODIN) 5-325 MG tablet Take 1 tablet by mouth every 6 (six) hours as needed for moderate pain.   losartan (COZAAR) 100 MG tablet Take 1 tablet (100 mg total) by mouth daily. (Patient not taking: Reported on 05/29/2022)   naloxone ([facility] nasal spray 4 mg/0.1 mL Nasally for overdose   No facility-administered encounter medications on file as of 05/29/2022.    Allergies (verified) Patient has no known allergies.   History: Past Medical History:  Diagnosis Date   Arthritis    "all  over" (03/18/2013)   Carpal tunnel syndrome    "right" (03/18/2013)   Fracture 02-22-2012   RLE   GERD (gastroesophageal reflux disease)    OTC   Hypertension    IBS (irritable bowel syndrome)    Osteoarthritis of left hip    Osteopenia 05/28/2021   Seizures ([place]) 1960's; February 21, 1977   "when I was little; when I was 7 months pregnant" (03/18/2013)   Shortness of breath    "not since I quit smoking" (03/18/2013)   Past Surgical History:  Procedure Laterality Date   ANTERIOR CERVICAL DECOMP/DISCECTOMY FUSION  ?02-21-2002    ANTERIOR CERVICAL DECOMP/DISCECTOMY FUSION N/A 04/24/2018   Procedure: C6-7 PLATE REMOVAL, S2-8, C5-6 ANTERIOR CERVICAL DECOMPRESSION/DISCECTOMY & FUSION, ALLOGRAFT, PLATE;  Surgeon: Marybelle Killings, MD;  Location: [facility];  Service: Orthopedics;  Laterality: N/A;   CHOLECYSTECTOMY  2002/02/21   COLONOSCOPY N/A 02/17/2014   Procedure: COLONOSCOPY;  Surgeon: Rogene Houston, MD;  [facility];  Service: Endoscopy;  Laterality: N/A;  145-moved to 10:30 Ann to notify pt   COLONOSCOPY N/A 08/25/2019   Procedure: COLONOSCOPY;  Surgeon: Rogene Houston, MD;  [facility];  Service: Endoscopy;  Laterality: N/A;  1030   FOOT ARTHROTOMY Right 1990's   pin, after removing a bone    JOINT REPLACEMENT Left    hip and right   pinched nerve     POLYPECTOMY  08/25/2019   Procedure: POLYPECTOMY;  Surgeon: Rogene Houston, MD;  [facility];  Service: Endoscopy;;   TOTAL HIP ARTHROPLASTY  06/30/2012   Procedure: TOTAL HIP ARTHROPLASTY;  Surgeon: Jessy Oto, MD;  Location: [facility];  Service: Orthopedics;  Laterality: Left;  Left total hip replacement with metal and polypropylene pore coated implants   TOTAL HIP ARTHROPLASTY Right 03/16/2013   Procedure: RIGHT TOTAL HIP ARTHROPLASTY- right ;  Surgeon: Jessy Oto, MD;  Location: [facility];  Service: Orthopedics;  Laterality: Right;   TUBAL LIGATION  1978   VAGINAL HYSTERECTOMY  ~ February 21, 2002   Family History  Problem Relation Age of Onset   Hypertension Father    Diabetes Father    Hypertension Sister    Diabetes Sister    Hypertension Brother    Diabetes Brother    Diabetes Mother    Hypertension Mother    Hyperlipidemia Mother    COPD Brother    Heart disease Brother    Hypertension Brother    Social History   Socioeconomic History   Marital status: Divorced    Spouse name: Not on file   Number of children: 1   Years of education: 9   Highest education level: 9th grade  Occupational History    Employer: DISABLED  Tobacco Use    Smoking status: Former    Packs/day: 0.25    Years: 25.00    Total pack years: 6.25    Types: Cigarettes   Smokeless tobacco: Never   Tobacco comments:    Light smoker - has tried to quit, but always starts back  Vaping Use   Vaping Use: Never used  Substance and Sexual Activity   Alcohol use: No    Alcohol/week: 0.0 standard drinks of alcohol   Drug use: No   Sexual activity: Yes  Other Topics Concern   Not on file  Social History Narrative   Her significant other of 15 years passed away in 02-22-2020   2022-05-30 - She recently adopted a one year old. (From her ex-fiance's grandchild who's  parents didn't take care of her well and she was taken by social services)   Daughter and mother live nearby   Social Determinants of Health   Financial Resource Strain: Low Risk  (05/29/2022)   Overall Financial Resource Strain (CARDIA)    Difficulty of Paying Living Expenses: Not very hard  Food Insecurity: No Food Insecurity (05/29/2022)   Hunger Vital Sign    Worried About Running Out of Food in the Last Year: Never true    Ran Out of Food in the Last Year: Never true  Transportation Needs: No Transportation Needs (05/29/2022)   PRAPARE - Hydrologist (Medical): No    Lack of Transportation (Non-Medical): No  Physical Activity: Insufficiently Active (05/29/2022)   Exercise Vital Sign    Days of Exercise per Week: 7 days    Minutes of Exercise per Session: 20 min  Stress: No Stress Concern Present (05/29/2022)   [place]    Feeling of Stress : Not at all  Social Connections: Socially Isolated (05/29/2022)   Social Connection and Isolation Panel [NHANES]    Frequency of Communication with Friends and Family: More than three times a week    Frequency of Social Gatherings with Friends and Family: More than three times a week    Attends Religious Services: Never    Marine scientist or  Organizations: No    Attends Music therapist: Never    Marital Status: Divorced    Tobacco Counseling Counseling given: Yes Tobacco comments: Light smoker - has tried to quit, but always starts back   Clinical Intake:  Pre-visit preparation completed: Yes  Pain : No/denies pain     BMI - recorded: 27.79 Nutritional Status: BMI 25 -29 Overweight Nutritional Risks: None Diabetes: No  How often do you need to have someone help you when you read instructions, pamphlets, or other written materials from your doctor or pharmacy?: 1 - Never  Diabetic? no  Interpreter Needed?: No  Information entered by :: Ashvik Grundman, LPN   Activities of Daily Living    05/29/2022    3:45 PM  In your present state of health, do you have any difficulty performing the following activities:  Hearing? 1  Comment mild  Vision? 0  Difficulty concentrating or making decisions? 0  Walking or climbing stairs? 0  Dressing or bathing? 0  Doing errands, shopping? 0  Preparing Food and eating ? N  Using the Toilet? N  In the past six months, have you accidently leaked urine? N  Do you have problems with loss of bowel control? N  Managing your Medications? N  Managing your Finances? N  Housekeeping or managing your Housekeeping? N    Patient Care Team: Gwenlyn Perking, FNP as PCP - General (Family Medicine) Edythe Clarity, [facility] as Pharmacist (Pharmacist)  Indicate any recent Medical Services you may have received from other than [facility] providers in the past year (date may be approximate).     Assessment:   This is a routine wellness examination for Tamyah.  Hearing/Vision screen Hearing Screening - Comments:: C/o mild hearing loss - denies hearing aids Vision Screening - Comments:: Wears rx glasses - up to date with routine eye exams with [facility]  Dietary issues and exercise activities discussed: Current Exercise Habits: The patient does not participate in regular  exercise at present, Exercise limited by: orthopedic condition(s)   Goals Addressed  This Visit's Progress    Exercise 3x per week (30 min per time)   Not on track      Depression Screen    05/29/2022    3:42 PM 05/16/2022    9:40 AM 03/29/2022   11:07 AM 02/15/2022   10:41 AM 11/16/2021   10:54 AM 08/17/2021   11:25 AM 05/28/2021    3:50 PM  PHQ 2/9 Scores  PHQ - 2 Score 0 0 0 0 0 2 0  PHQ- 9 Score 0 0 0 1 0 6 0    Fall Risk    05/29/2022    3:39 PM 05/16/2022    9:40 AM 03/29/2022   11:07 AM 02/15/2022   10:39 AM 11/16/2021   10:53 AM  Fall Risk   Falls in the past year? 0 0 0 0 1  Number falls in past yr: 0    0  Injury with Fall? 0    0  Risk for fall due to : Orthopedic patient    History of fall(s)  Follow up Falls prevention discussed   Falls evaluation completed Falls evaluation completed    [place]:  Any stairs in or around the home? No  If so, are there any without handrails? No  Home free of loose throw rugs in walkways, pet beds, electrical cords, etc? Yes  Adequate lighting in your home to reduce risk of falls? Yes   ASSISTIVE DEVICES UTILIZED TO PREVENT FALLS:  Life alert? No  Use of a cane, walker or w/c? No  Grab bars in the bathroom? No  Shower chair or bench in shower? No  Elevated toilet seat or a handicapped toilet? No   TIMED UP AND GO:  Was the test performed? No . Telephonic visit  Cognitive Function:        05/29/2022    3:46 PM 05/28/2021    3:43 PM  6CIT Screen  What Year? 0 points 0 points  What month? 0 points 0 points  What time? 0 points 0 points  Count back from 20 0 points 0 points  Months in reverse 0 points 0 points  Repeat phrase 4 points 0 points  Total Score 4 points 0 points    Immunizations Immunization History  Administered Date(s) Administered   Influenza, High Dose Seasonal PF 09/30/2020, 09/22/2021   Influenza, Quadrivalent, Recombinant, Inj, Pf 09/12/2018    Influenza,inj,Quad PF,6+ Mos 08/27/2017, 09/12/2018, 08/23/2019   Influenza-Unspecified 10/17/2015, 09/05/2016, 08/23/2019   Moderna Sars-Covid-2 Vaccination 02/21/2020, 03/20/2020   PNEUMOCOCCAL CONJUGATE-20 08/17/2021   Pneumococcal Polysaccharide-23 03/18/2013, 06/19/2020   Td 05/03/1998, 05/30/2021   Tdap 03/21/2011   Zoster Recombinat (Shingrix) 09/12/2018, 01/13/2019    TDAP status: Up to date  Flu Vaccine status: Up to date  Pneumococcal vaccine status: Up to date  Covid-19 vaccine status: Completed vaccines  Qualifies for Shingles Vaccine? Yes   Zostavax completed Yes   Shingrix Completed?: Yes  Screening Tests Health Maintenance  Topic Date Due   COVID-19 Vaccine (3 - Moderna risk series) 06/10/2022 (Originally 04/17/2020)   INFLUENZA VACCINE  07/09/2022   MAMMOGRAM  06/07/2023   COLONOSCOPY (Pts 45-49yr Insurance coverage will need to be confirmed)  08/24/2026   TETANUS/TDAP  05/31/2031   Pneumonia Vaccine 67 Years old  Completed   DEXA SCAN  Completed   Hepatitis C Screening  Completed   Zoster Vaccines- Shingrix  Completed   HPV VACCINES  Aged Out    Health Maintenance  There are  no preventive care reminders to display for this patient.  Colorectal cancer screening: Type of screening: Colonoscopy. Completed 08/25/2019. Repeat every 7 years  Mammogram status: Completed 06/06/2021. Repeat every year has appt 06/07/2022  Bone Density status: Completed 05/25/2021. Results reflect: Bone density results: OSTEOPENIA. Repeat every 2 years.  Lung Cancer Screening: (Low Dose CT Chest recommended if Age 58-80 years, 30 pack-year currently smoking OR have quit w/in 15years.) does not qualify.   Additional Screening:  Hepatitis C Screening: does qualify; Completed 02/04/2017  Vision Screening: Recommended annual ophthalmology exams for early detection of glaucoma and other disorders of the eye. Is the patient up to date with their annual eye exam?  Yes  Who is the  provider or what is the name of the office in which the patient attends annual eye exams? [facility] If pt is not established with a provider, would they like to be referred to a provider to establish care? No .   Dental Screening: Recommended annual dental exams for proper oral hygiene  Community Resource Referral / Chronic Care Management: CRR required this visit?  No   CCM required this visit?  No      Plan:     I have personally reviewed and noted the following in the patient's chart:   Medical and social history Use of alcohol, tobacco or illicit drugs  Current medications and supplements including opioid prescriptions.  Functional ability and status Nutritional status Physical activity Advanced directives List of other physicians Hospitalizations, surgeries, and ER visits in previous 12 months Vitals Screenings to include cognitive, depression, and falls Referrals and appointments  In addition, I have reviewed and discussed with patient certain preventive protocols, quality metrics, and best practice recommendations. A written personalized care plan for preventive services as well as general preventive health recommendations were provided to patient.     Sandrea Hammond, LPN   [id number]/[id number]   Nurse Notes: none

## 2022-05-29 NOTE — Patient Instructions (Signed)
Kathy Barr , Thank you for taking time to come for your Medicare Wellness Visit. I appreciate your ongoing commitment to your health goals. Please review the following plan we discussed and let me know if I can assist you in the future.   Screening recommendations/referrals: Colonoscopy: Done 08/25/2019 - repeat in 7 years Mammogram: Done 06/06/2021 - Repeat annually * appointment 06/07/22 Bone Density: Done 05/25/2021 - Repeat every 2 years  Recommended yearly ophthalmology/optometry visit for glaucoma screening and checkup Recommended yearly dental visit for hygiene and checkup  Vaccinations: Influenza vaccine: Done 09/22/2021 - Repeat annually  Pneumococcal vaccine: Done 03/18/2013, 06/19/2020, & 08/17/2021 Tdap vaccine: Done 05/30/2021 - Repeat in 10 years  Shingles vaccine: Done 09/12/2018 & 01/13/2019 Covid-19: Done 02/21/2020 & 03/20/2020  Advanced directives: Please bring a copy of your health care power of attorney and living will to the office to be added to your chart at your convenience.   Conditions/risks identified: Aim for 30 minutes of exercise or brisk walking, 6-8 glasses of water, and 5 servings of fruits and vegetables each day.   Next appointment: Follow up in one year for your annual wellness visit    Preventive Care 65 Years and Older, Female Preventive care refers to lifestyle choices and visits with your health care provider that can promote health and wellness. What does preventive care include? A yearly physical exam. This is also called an annual well check. Dental exams once or twice a year. Routine eye exams. Ask your health care provider how often you should have your eyes checked. Personal lifestyle choices, including: Daily care of your teeth and gums. Regular physical activity. Eating a healthy diet. Avoiding tobacco and drug use. Limiting alcohol use. Practicing safe sex. Taking low-dose aspirin every day. Taking vitamin and mineral supplements as recommended  by your health care provider. What happens during an annual well check? The services and screenings done by your health care provider during your annual well check will depend on your age, overall health, lifestyle risk factors, and family history of disease. Counseling  Your health care provider may ask you questions about your: Alcohol use. Tobacco use. Drug use. Emotional well-being. Home and relationship well-being. Sexual activity. Eating habits. History of falls. Memory and ability to understand (cognition). Work and work Statistician. Reproductive health. Screening  You may have the following tests or measurements: Height, weight, and BMI. Blood pressure. Lipid and cholesterol levels. These may be checked every 5 years, or more frequently if you are over 66 years old. Skin check. Lung cancer screening. You may have this screening every year starting at age 36 if you have a 30-pack-year history of smoking and currently smoke or have quit within the past 15 years. Fecal occult blood test (FOBT) of the stool. You may have this test every year starting at age 73. Flexible sigmoidoscopy or colonoscopy. You may have a sigmoidoscopy every 5 years or a colonoscopy every 10 years starting at age 39. Hepatitis C blood test. Hepatitis B blood test. Sexually transmitted disease (STD) testing. Diabetes screening. This is done by checking your blood sugar (glucose) after you have not eaten for a while (fasting). You may have this done every 1-3 years. Bone density scan. This is done to screen for osteoporosis. You may have this done starting at age 4. Mammogram. This may be done every 1-2 years. Talk to your health care provider about how often you should have regular mammograms. Talk with your health care provider about your test results, treatment  options, and if necessary, the need for more tests. Vaccines  Your health care provider may recommend certain vaccines, such as: Influenza  vaccine. This is recommended every year. Tetanus, diphtheria, and acellular pertussis (Tdap, Td) vaccine. You may need a Td booster every 10 years. Zoster vaccine. You may need this after age 33. Pneumococcal 13-valent conjugate (PCV13) vaccine. One dose is recommended after age 37. Pneumococcal polysaccharide (PPSV23) vaccine. One dose is recommended after age 64. Talk to your health care provider about which screenings and vaccines you need and how often you need them. This information is not intended to replace advice given to you by your health care provider. Make sure you discuss any questions you have with your health care provider. Document Released: 12/22/2015 Document Revised: 08/14/2016 Document Reviewed: 09/26/2015 Elsevier Interactive Patient Education  2017 [place] Prevention in the Home Falls can cause injuries. They can happen to people of all ages. There are many things you can do to make your home safe and to help prevent falls. What can I do on the outside of my home? Regularly fix the edges of walkways and driveways and fix any cracks. Remove anything that might make you trip as you walk through a door, such as a raised step or threshold. Trim any bushes or trees on the path to your home. Use bright outdoor lighting. Clear any walking paths of anything that might make someone trip, such as rocks or tools. Regularly check to see if handrails are loose or broken. Make sure that both sides of any steps have handrails. Any raised decks and porches should have guardrails on the edges. Have any leaves, snow, or ice cleared regularly. Use sand or salt on walking paths during winter. Clean up any spills in your garage right away. This includes oil or grease spills. What can I do in the bathroom? Use night lights. Install grab bars by the toilet and in the tub and shower. Do not use towel bars as grab bars. Use non-skid mats or decals in the tub or shower. If you  need to sit down in the shower, use a plastic, non-slip stool. Keep the floor dry. Clean up any water that spills on the floor as soon as it happens. Remove soap buildup in the tub or shower regularly. Attach bath mats securely with double-sided non-slip rug tape. Do not have throw rugs and other things on the floor that can make you trip. What can I do in the bedroom? Use night lights. Make sure that you have a light by your bed that is easy to reach. Do not use any sheets or blankets that are too big for your bed. They should not hang down onto the floor. Have a firm chair that has side arms. You can use this for support while you get dressed. Do not have throw rugs and other things on the floor that can make you trip. What can I do in the kitchen? Clean up any spills right away. Avoid walking on wet floors. Keep items that you use a lot in easy-to-reach places. If you need to reach something above you, use a strong step stool that has a grab bar. Keep electrical cords out of the way. Do not use floor polish or wax that makes floors slippery. If you must use wax, use non-skid floor wax. Do not have throw rugs and other things on the floor that can make you trip. What can I do with my stairs? Do not  leave any items on the stairs. Make sure that there are handrails on both sides of the stairs and use them. Fix handrails that are broken or loose. Make sure that handrails are as long as the stairways. Check any carpeting to make sure that it is firmly attached to the stairs. Fix any carpet that is loose or worn. Avoid having throw rugs at the top or bottom of the stairs. If you do have throw rugs, attach them to the floor with carpet tape. Make sure that you have a light switch at the top of the stairs and the bottom of the stairs. If you do not have them, ask someone to add them for you. What else can I do to help prevent falls? Wear shoes that: Do not have high heels. Have rubber  bottoms. Are comfortable and fit you well. Are closed at the toe. Do not wear sandals. If you use a stepladder: Make sure that it is fully opened. Do not climb a closed stepladder. Make sure that both sides of the stepladder are locked into place. Ask someone to hold it for you, if possible. Clearly mark and make sure that you can see: Any grab bars or handrails. First and last steps. Where the edge of each step is. Use tools that help you move around (mobility aids) if they are needed. These include: Canes. Walkers. Scooters. Crutches. Turn on the lights when you go into a dark area. Replace any light bulbs as soon as they burn out. Set up your furniture so you have a clear path. Avoid moving your furniture around. If any of your floors are uneven, fix them. If there are any pets around you, be aware of where they are. Review your medicines with your doctor. Some medicines can make you feel dizzy. This can increase your chance of falling. Ask your doctor what other things that you can do to help prevent falls. This information is not intended to replace advice given to you by your health care provider. Make sure you discuss any questions you have with your health care provider. Document Released: 09/21/2009 Document Revised: 05/02/2016 Document Reviewed: 12/30/2014 Elsevier Interactive Patient Education  2017 [employer].

## 2022-06-04 ENCOUNTER — Other Ambulatory Visit: Payer: Self-pay | Admitting: Family Medicine

## 2022-06-04 DIAGNOSIS — Z1231 Encounter for screening mammogram for malignant neoplasm of breast: Secondary | ICD-10-CM

## 2022-06-07 ENCOUNTER — Ambulatory Visit: Payer: Medicare Other

## 2022-06-12 ENCOUNTER — Inpatient Hospital Stay: Admission: RE | Admit: 2022-06-12 | Payer: Medicare Other | Source: Ambulatory Visit

## 2022-07-01 ENCOUNTER — Ambulatory Visit
Admission: RE | Admit: 2022-07-01 | Discharge: 2022-07-01 | Disposition: A | Discharge status: Home / Self Care | Payer: Medicare Other | Source: Ambulatory Visit | Attending: Family Medicine | Admitting: Family Medicine

## 2022-07-01 DIAGNOSIS — Z1231 Encounter for screening mammogram for malignant neoplasm of breast: Secondary | ICD-10-CM

## 2022-08-28 ENCOUNTER — Ambulatory Visit (INDEPENDENT_AMBULATORY_CARE_PROVIDER_SITE_OTHER): Payer: Medicare Other | Admitting: Family Medicine

## 2022-08-28 ENCOUNTER — Encounter: Payer: Self-pay | Admitting: Family Medicine

## 2022-08-28 VITALS — BP 114/71 | HR 82 | Temp 98.1°F | Ht 65.0 in | Wt 170.6 lb

## 2022-08-28 DIAGNOSIS — H60502 Unspecified acute noninfective otitis externa, left ear: Secondary | ICD-10-CM | POA: Diagnosis not present

## 2022-08-28 DIAGNOSIS — M159 Polyosteoarthritis, unspecified: Secondary | ICD-10-CM

## 2022-08-28 DIAGNOSIS — M5136 Other intervertebral disc degeneration, lumbar region: Secondary | ICD-10-CM | POA: Diagnosis not present

## 2022-08-28 DIAGNOSIS — Z79899 Other long term (current) drug therapy: Secondary | ICD-10-CM

## 2022-08-28 DIAGNOSIS — I10 Essential (primary) hypertension: Secondary | ICD-10-CM | POA: Diagnosis not present

## 2022-08-28 DIAGNOSIS — Z23 Encounter for immunization: Secondary | ICD-10-CM

## 2022-08-28 MED ORDER — OFLOXACIN 0.3 % OT SOLN: 10.0000 [drp] | Freq: Every day | OTIC | 0 refills | Status: AC

## 2022-08-28 MED ORDER — HYDROCODONE-ACETAMINOPHEN 5-325 MG PO TABS: 1.0000 | ORAL_TABLET | Freq: Four times a day (QID) | ORAL | 0 refills | Status: DC | PRN

## 2022-08-28 MED ORDER — PREDNISONE 20 MG PO TABS: 40.0000 mg | ORAL_TABLET | Freq: Every day | ORAL | 0 refills | Status: AC

## 2022-08-28 NOTE — Progress Notes (Addendum)
Established Patient Office Visit  Subjective:  Patient ID: Kathy Barr, female    DOB: May 08, 1956  Age: 66 y.o. MRN: [id number]  CC:  Chief Complaint  Patient presents with   Medical Management of Chronic Issues    HPI Kathy Barr presents for chronic follow up.  HTN Complaint with meds - Yes Current Medications - losartan and HCTZ  Checking BP at home ranging- hasn't been checking Exercising Regularly - occasionally, stays active during the day Watching Salt intake - Yes Pertinent ROS:  Headache - No Fatigue - No Visual Disturbances - No Chest pain - No Dyspnea - No Palpitations - No LE edema - No They report good compliance with medications and can restate their regimen by memory. No medication side effects.  BP Readings from Last 3 Encounters:  08/28/22 114/71  05/29/22 117/78  05/16/22 (!) 102/58   2. Generalized OA DDD Pain assessment: Cause of pain- generalized OA Pain location- all joints, mostly shoulder, hips, back Pain on scale of 1-10:  7-8 She takes her pain medication 2x a day some days and not all other days. She also takes meloxicam and plain tylenol prn. She also uses topical pain ointments. Reports increased radicular pain on right lower back down to right lower leg. Denies changes in bowel or bladder control, injury, saddle anesthesia, or fever. Frequency- daily What increases pain- activity, bending, twisting What makes pain Better-rest, pain medicine improves pain to 2/10 Effects on ADL - difficulty doing housework,  Any change in general medical condition-no  Current opioids rx- Norco q 6 hours prn # meds rx- 30 tablets monthly Effectiveness of current meds- brings pain down to a 2/10 Adverse reactions from pain meds- denies Morphine equivalent- 150 mme per Rx, 3.3 mme per day avg  Pill count performed-No Last drug screen - 02/15/22 ( high risk q10m moderate risk q612mlow risk yearly ) Urine drug screen today- No [place] [facility] [place]  If yes were their any concerning findings? - no  Overdose risk: 140    02/15/2022   11:22 AM  Opioid Risk   Alcohol 0  Illegal Drugs 0  Rx Drugs 0  Alcohol 0  Illegal Drugs 0  Rx Drugs 0  Age between 16-45 years  0  History of Preadolescent Sexual Abuse 0  Psychological Disease 0  Depression 0  Opioid Risk Tool Scoring 0  Opioid Risk Interpretation Low Risk   Pain contract signed on: 02/23/21   3. Left ear pain She reports intermittent left ear pain x 1 week. This is the same paint hat she had that resolved with abx ear drops. She denies fever, drainage, or congestion.    Past Medical History:  Diagnosis Date   Arthritis    "all over" (03/18/2013)   Carpal tunnel syndrome    "right" (03/18/2013)   Fracture 2013   RLE   GERD (gastroesophageal reflux disease)    OTC   Hypertension    IBS (irritable bowel syndrome)    Osteoarthritis of left hip    Osteopenia 05/28/2021   [place]   "when I was little; when I was 7 months pregnant" (03/18/2013)   Shortness of breath    "not since I quit smoking" (03/18/2013)    Past Surgical History:  Procedure Laterality Date   ANTERIOR CERVICAL DECOMP/DISCECTOMY FUSION  ?2003   ANTERIOR CERVICAL DECOMP/DISCECTOMY FUSION N/A 04/24/2018   Procedure: C6-7 PLATE REMOVAL, C4H7-3C5-6 ANTERIOR CERVICAL DECOMPRESSION/DISCECTOMY & FUSION, ALLOGRAFT,  PLATE;  Surgeon: Marybelle Killings, MD;  Location: [facility];  Service: Orthopedics;  Laterality: N/A;   CHOLECYSTECTOMY  2002/02/23   COLONOSCOPY N/A 02/17/2014   Procedure: COLONOSCOPY;  Surgeon: Rogene Houston, MD;  [facility];  Service: Endoscopy;  Laterality: N/A;  145-moved to 10:30 Ann to notify pt   COLONOSCOPY N/A 08/25/2019   Procedure: COLONOSCOPY;  Surgeon: Rogene Houston, MD;  [facility];  Service: Endoscopy;  Laterality: N/A;  1030   FOOT ARTHROTOMY Right 1990's   pin, after removing a bone    JOINT REPLACEMENT Left    hip and right    pinched nerve     POLYPECTOMY  08/25/2019   Procedure: POLYPECTOMY;  Surgeon: Rogene Houston, MD;  [facility];  Service: Endoscopy;;   TOTAL HIP ARTHROPLASTY  06/30/2012   Procedure: TOTAL HIP ARTHROPLASTY;  Surgeon: Jessy Oto, MD;  Location: [facility];  Service: Orthopedics;  Laterality: Left;  Left total hip replacement with metal and polypropylene pore coated implants   TOTAL HIP ARTHROPLASTY Right 03/16/2013   Procedure: RIGHT TOTAL HIP ARTHROPLASTY- right ;  Surgeon: Jessy Oto, MD;  Location: [facility];  Service: Orthopedics;  Laterality: Right;   TUBAL LIGATION  1978   VAGINAL HYSTERECTOMY  ~ 23-Feb-2002    Family History  Problem Relation Age of Onset   Hypertension Father    Diabetes Father    Hypertension Sister    Diabetes Sister    Hypertension Brother    Diabetes Brother    Diabetes Mother    Hypertension Mother    Hyperlipidemia Mother    COPD Brother    Heart disease Brother    Hypertension Brother     Social History   Socioeconomic History   Marital status: Divorced    Spouse name: Not on file   Number of children: 1   Years of education: 9   Highest education level: 9th grade  Occupational History    Employer: DISABLED  Tobacco Use   Smoking status: Former    Packs/day: 0.25    Years: 25.00    Total pack years: 6.25    Types: Cigarettes   Smokeless tobacco: Never   Tobacco comments:    Light smoker - has tried to quit, but always starts back  Vaping Use   Vaping Use: Never used  Substance and Sexual Activity   Alcohol use: No    Alcohol/week: 0.0 standard drinks of alcohol   Drug use: No   Sexual activity: Yes  Other Topics Concern   Not on file  Social History Narrative   Her significant other of 15 years passed away in 24-Feb-2020   06/14/22 - She recently adopted a one year old. (From her ex-fiance's grandchild who's parents didn't take care of her well and she was taken by social services)   Daughter and mother live nearby   Social  Determinants of Health   Financial Resource Strain: Low Risk  (06-14-2022)   Overall Financial Resource Strain (CARDIA)    Difficulty of Paying Living Expenses: Not very hard  Food Insecurity: No Food Insecurity (Jun 14, 2022)   Hunger Vital Sign    Worried About Running Out of Food in the Last Year: Never true    Ran Out of Food in the Last Year: Never true  Transportation Needs: No Transportation Needs (June 14, 2022)   PRAPARE - Hydrologist (Medical): No    Lack of Transportation (Non-Medical):  No  Physical Activity: Insufficiently Active (05/29/2022)   Exercise Vital Sign    Days of Exercise per Week: 7 days    Minutes of Exercise per Session: 20 min  Stress: No Stress Concern Present (05/29/2022)   [place]    Feeling of Stress : Not at all  Social Connections: Socially Isolated (05/29/2022)   Social Connection and Isolation Panel [NHANES]    Frequency of Communication with Friends and Family: More than three times a week    Frequency of Social Gatherings with Friends and Family: More than three times a week    Attends Religious Services: Never    Marine scientist or Organizations: No    Attends Archivist Meetings: Never    Marital Status: Divorced  Human resources officer Violence: Not At Risk (05/29/2022)   Humiliation, Afraid, Rape, and Kick questionnaire    Fear of Current or Ex-Partner: No    Emotionally Abused: No    Physically Abused: No    Sexually Abused: No    Outpatient Medications Prior to Visit  Medication Sig Dispense Refill   albuterol (VENTOLIN HFA) 108 (90 Base) MCG/ACT inhaler INHALE 2 PUFFS INTO LUNGS EVERY 6 HOURS AS NEEDED FOR WHEEZING OR SHORTNESS OF BREATH 18 g 2   Calcium Carb-Cholecalciferol (CALCIUM 600+D3 PO) Take 1 tablet by mouth 2 (two) times daily.     hydrochlorothiazide (HYDRODIURIL) 25 MG tablet Take 1 tablet (25 mg total) by mouth daily. 90  tablet 1   loratadine (CLARITIN) 10 MG tablet Take 10 mg by mouth daily.     losartan (COZAAR) 100 MG tablet Take 1 tablet (100 mg total) by mouth daily. 90 tablet 1   losartan (COZAAR) 50 MG tablet Take 1 tablet (50 mg total) by mouth daily. 90 tablet 1   meloxicam (MOBIC) 7.5 MG tablet Take 1 tablet (7.5 mg total) by mouth daily. 90 tablet 0   naloxone (NARCAN) nasal spray 4 mg/0.1 mL Nasally for overdose 1 each 3   No facility-administered medications prior to visit.    No Known Allergies  ROS Review of Systems As per HPI.    Objective:    Physical Exam Vitals and nursing note reviewed.  Constitutional:      General: She is not in acute distress.    Appearance: Normal appearance. She is not ill-appearing, toxic-appearing or diaphoretic.  HENT:     Head: Normocephalic and atraumatic.     Right Ear: Tympanic membrane, ear canal and external ear normal.     Left Ear: Tympanic membrane and external ear normal. Swelling (canal) and tenderness (canal) present.     Mouth/Throat:     Mouth: Mucous membranes are moist.     Pharynx: Oropharynx is clear.  Eyes:     Extraocular Movements: Extraocular movements intact.     Conjunctiva/sclera: Conjunctivae normal.     Pupils: Pupils are equal, round, and reactive to light.  Cardiovascular:     Rate and Rhythm: Normal rate and regular rhythm.     Pulses: Normal pulses.     Heart sounds: Normal heart sounds. No murmur heard. Pulmonary:     Effort: Pulmonary effort is normal. No respiratory distress.     Breath sounds: Normal breath sounds.  Musculoskeletal:        General: No swelling.     Lumbar back: Tenderness present. No swelling, edema, deformity, signs of trauma or bony tenderness.     Right lower leg: No  edema.     Left lower leg: No edema.  Skin:    General: Skin is warm and dry.  Neurological:     General: No focal deficit present.     Mental Status: She is alert and oriented to person, place, and time.  Psychiatric:         Mood and Affect: Mood normal.        Behavior: Behavior normal.        Thought Content: Thought content normal.        Judgment: Judgment normal.     BP 114/71   Pulse 82   Temp 98.1 F (36.7 C)   Ht '5\' 5"'  (1.651 m)   Wt 170 lb 9.6 oz (77.4 kg)   SpO2 97%   BMI 28.39 kg/m  Wt Readings from Last 3 Encounters:  08/28/22 170 lb 9.6 oz (77.4 kg)  05/29/22 167 lb (75.8 kg)  05/23/22 167 lb 6 oz (75.9 kg)     Health Maintenance Due  Topic Date Due   INFLUENZA VACCINE  07/09/2022     There are no preventive care reminders to display for this patient.  Lab Results  Component Value Date   TSH 1.680 02/15/2022   Lab Results  Component Value Date   WBC 4.6 05/16/2022   HGB 13.8 05/16/2022   HCT 41.4 05/16/2022   MCV 84 05/16/2022   PLT 235 05/16/2022   Lab Results  Component Value Date   NA 141 05/16/2022   K 3.3 (L) 05/16/2022   CO2 26 05/16/2022   GLUCOSE 91 05/16/2022   BUN 6 (L) 05/16/2022   CREATININE 0.64 05/16/2022   BILITOT 0.7 05/16/2022   ALKPHOS 93 05/16/2022   AST 17 05/16/2022   ALT 10 05/16/2022   PROT 7.3 05/16/2022   ALBUMIN 4.3 05/16/2022   CALCIUM 9.5 05/16/2022   ANIONGAP 11 04/10/2018   EGFR 97 05/16/2022   Lab Results  Component Value Date   CHOL 173 02/15/2022   Lab Results  Component Value Date   HDL 57 02/15/2022   Lab Results  Component Value Date   LDLCALC 90 02/15/2022   Lab Results  Component Value Date   TRIG 148 02/15/2022   Lab Results  Component Value Date   CHOLHDL 3.0 02/15/2022   Lab Results  Component Value Date   HGBA1C 4.6 (L) 05/16/2022      Assessment & Plan:   Adonna was seen today for medical management of chronic issues.  Diagnoses and all orders for this visit:  Primary hypertension Well controlled on current regimen. Labs pending. Continue losartan, HCTZ.  -     CBC with Differential/Platelet -     CMP14+EGFR  Generalized OA DDD (degenerative disc disease), lumbar Controlled  substance agreement signed PDMP reviewed, no red flags. Refills provided, well controlled. Will try prednisone burst for increased radicular pain. Continue supportive measures.  -     predniSONE (DELTASONE) 20 MG tablet; Take 2 tablets (40 mg total) by mouth daily with breakfast for 5 days. -     HYDROcodone-acetaminophen (NORCO) 5-325 MG tablet; Take 1 tablet by mouth every 6 (six) hours as needed for moderate pain. -     HYDROcodone-acetaminophen (NORCO) 5-325 MG tablet; Take 1 tablet by mouth every 6 (six) hours as needed for moderate pain. -     HYDROcodone-acetaminophen (NORCO) 5-325 MG tablet; Take 1 tablet by mouth every 6 (six) hours as needed for moderate pain.  Acute otitis externa of left ear,  unspecified type Swelling and tenderness of cana. Abx as below.  -     ofloxacin (FLOXIN OTIC) 0.3 % OTIC solution; Place 10 drops into the left ear daily for 7 days.  Flu vaccine today in office.   Follow-up: Return in about 3 months (around 11/27/2022) for chronic follow up with fasting labs.   The patient indicates understanding of these issues and agrees with the plan.  Gwenlyn Perking, FNP

## 2022-08-28 NOTE — Patient Instructions (Signed)

## 2022-08-29 LAB — CBC WITH DIFFERENTIAL/PLATELET
Basophils Absolute: 0 10*3/uL (ref 0.0–0.2)
Basos: 1 %
EOS (ABSOLUTE): 0.1 10*3/uL (ref 0.0–0.4)
Eos: 2 %
Hematocrit: 38.4 % (ref 34.0–46.6)
Hemoglobin: 13.1 g/dL (ref 11.1–15.9)
Immature Grans (Abs): 0 10*3/uL (ref 0.0–0.1)
Immature Granulocytes: 0 %
Lymphocytes Absolute: 1.2 10*3/uL (ref 0.7–3.1)
Lymphs: 19 %
MCH: 28.4 pg (ref 26.6–33.0)
MCHC: 34.1 g/dL (ref 31.5–35.7)
MCV: 83 fL (ref 79–97)
Monocytes Absolute: 0.5 10*3/uL (ref 0.1–0.9)
Monocytes: 8 %
Neutrophils Absolute: 4.6 10*3/uL (ref 1.4–7.0)
Neutrophils: 70 %
Platelets: 215 10*3/uL (ref 150–450)
RBC: 4.61 x10E6/uL (ref 3.77–5.28)
RDW: 12.1 % (ref 11.7–15.4)
WBC: 6.5 10*3/uL (ref 3.4–10.8)

## 2022-08-29 LAB — CMP14+EGFR
ALT: 8 IU/L (ref 0–32)
AST: 18 IU/L (ref 0–40)
Albumin/Globulin Ratio: 1.6 (ref 1.2–2.2)
Albumin: 4.2 g/dL (ref 3.9–4.9)
Alkaline Phosphatase: 94 IU/L (ref 44–121)
BUN/Creatinine Ratio: 14 (ref 12–28)
BUN: 9 mg/dL (ref 8–27)
Bilirubin Total: 0.8 mg/dL (ref 0.0–1.2)
CO2: 26 mmol/L (ref 20–29)
Calcium: 9.1 mg/dL (ref 8.7–10.3)
Chloride: 98 mmol/L (ref 96–106)
Creatinine, Ser: 0.66 mg/dL (ref 0.57–1.00)
Globulin, Total: 2.6 g/dL (ref 1.5–4.5)
Glucose: 91 mg/dL (ref 70–99)
Potassium: 3.4 mmol/L — ABNORMAL LOW (ref 3.5–5.2)
Sodium: 138 mmol/L (ref 134–144)
Total Protein: 6.8 g/dL (ref 6.0–8.5)
eGFR: 96 mL/min/{1.73_m2} (ref >59)

## 2022-08-30 ENCOUNTER — Other Ambulatory Visit: Payer: Self-pay | Admitting: Family Medicine

## 2022-08-30 DIAGNOSIS — E876 Hypokalemia: Secondary | ICD-10-CM

## 2022-08-30 MED ORDER — POTASSIUM CHLORIDE CRYS ER 10 MEQ PO TBCR: 10.0000 meq | EXTENDED_RELEASE_TABLET | Freq: Every day | ORAL | 1 refills | Status: DC

## 2022-09-12 ENCOUNTER — Telehealth: Payer: Self-pay | Admitting: Family Medicine

## 2022-09-12 DIAGNOSIS — H60502 Unspecified acute noninfective otitis externa, left ear: Secondary | ICD-10-CM

## 2022-09-12 NOTE — Telephone Encounter (Signed)
Ok to do referral to ENT? Looks like she has had multiple ear infections/problems this year

## 2022-09-12 NOTE — Telephone Encounter (Signed)
Pt seen 08/28/2022 and finished medication for ear drops. She says she is still hurting in the outside of the ear. Pt wants to know if pcp will send in more medication. Can she get a referral ENT? If possible, [place] or [place] location. Use [facility]. Please call back

## 2022-09-13 NOTE — Addendum Note (Signed)
Addended by: Rolena Infante on: 09/13/2022 10:28 AM   Modules accepted: Orders

## 2022-09-13 NOTE — Telephone Encounter (Signed)
Referral placed. Left detailed message on patients voicemail about referral and to contact the office if ear continues to bother her before we get this appt.

## 2022-09-13 NOTE — Telephone Encounter (Signed)
[facility] for referral to ENT. If her ear is still bothering her in the meantime, I would like to re-evaluate.

## 2022-09-18 ENCOUNTER — Encounter: Payer: Self-pay | Admitting: Family Medicine

## 2022-09-18 ENCOUNTER — Ambulatory Visit (INDEPENDENT_AMBULATORY_CARE_PROVIDER_SITE_OTHER): Payer: Medicare Other | Admitting: Family Medicine

## 2022-09-18 VITALS — BP 117/58 | HR 86 | Temp 97.2°F | Ht 65.0 in | Wt 171.0 lb

## 2022-09-18 DIAGNOSIS — M79652 Pain in left thigh: Secondary | ICD-10-CM | POA: Diagnosis not present

## 2022-09-18 DIAGNOSIS — R600 Localized edema: Secondary | ICD-10-CM | POA: Diagnosis not present

## 2022-09-18 NOTE — Progress Notes (Signed)
   Acute Office Visit  Subjective:     Patient ID: Kathy Barr, female    DOB: [birth year], 66 y.o.   MRN: [id number]  Chief Complaint  Patient presents with   Edema    HPI Patient is in today for edema of her left lower leg. Kathy Barr reports that this started yesterday in her left foot and ankle. It has now extended up to her knee. There is some pain in the lower portion of the back of her thigh. Denies pain in her calf, ankle, or foot. Kathy Barr denies tenderness, warmth, erythema, injury, fever, chills, chest pain, or shortness of breath.   ROS As per HPI.      Objective:    BP (!) 117/58   Pulse 86   Temp (!) 97.2 F (36.2 C) (Temporal)   Ht '5\' 5"'$  (1.651 m)   Wt 171 lb (77.6 kg)   SpO2 98%   BMI 28.46 kg/m    Physical Exam Vitals and nursing note reviewed.  Constitutional:      General: Kathy Barr is not in acute distress.    Appearance: Kathy Barr is not ill-appearing, toxic-appearing or diaphoretic.  Cardiovascular:     Rate and Rhythm: Normal rate and regular rhythm.     Heart sounds: Normal heart sounds. No murmur heard. Pulmonary:     Effort: Pulmonary effort is normal. No respiratory distress.  Musculoskeletal:     Right lower leg: No edema.     Left lower leg: 1+ Pitting Edema (extending from foot to knee) present.     Comments: Left calf: 40 cm girth, right calf: 37 cm girth. No erythema, tenderness, warmth, or signs of injury bilaterally.   Skin:    General: Skin is warm and dry.     Findings: No bruising.  Neurological:     General: No focal deficit present.     Mental Status: Kathy Barr is alert and oriented to person, place, and time.  Psychiatric:        Mood and Affect: Mood normal.        Behavior: Behavior normal.     No results found for any visits on 09/18/22.      Assessment & Plan:   Skylan was seen today for edema.  Diagnoses and all orders for this visit:  Edema of left lower leg Acute pain of left thigh Acute swelling with >3 cm difference in left lower  leg compared to right. No signs of cellulitis or injury. High risk for DVT per Well's criteria. Stat DVT US ordered. Will notify patient of results and plan of care when available.  -     US Venous Img Lower Unilateral Left; Future  Return to office for new or worsening symptoms, or if symptoms persist.   The patient indicates understanding of these issues and agrees with the plan.   Gwenlyn Perking, FNP

## 2022-09-19 ENCOUNTER — Encounter: Payer: Self-pay | Admitting: *Deleted

## 2022-09-19 ENCOUNTER — Ambulatory Visit (HOSPITAL_COMMUNITY)
Admission: RE | Admit: 2022-09-19 | Discharge: 2022-09-19 | Disposition: A | Discharge status: Home / Self Care | Payer: Medicare Other | Source: Ambulatory Visit | Attending: Family Medicine | Admitting: Family Medicine

## 2022-09-19 DIAGNOSIS — M79652 Pain in left thigh: Secondary | ICD-10-CM | POA: Insufficient documentation

## 2022-09-19 DIAGNOSIS — R600 Localized edema: Secondary | ICD-10-CM | POA: Diagnosis not present

## 2022-09-20 ENCOUNTER — Other Ambulatory Visit: Payer: Medicare Other

## 2022-09-20 DIAGNOSIS — E876 Hypokalemia: Secondary | ICD-10-CM

## 2022-09-20 LAB — POTASSIUM: Potassium: 3.7 mmol/L (ref 3.5–5.2)

## 2022-09-25 ENCOUNTER — Encounter: Payer: Self-pay | Admitting: *Deleted

## 2022-10-15 ENCOUNTER — Other Ambulatory Visit: Payer: Self-pay | Admitting: Family Medicine

## 2022-10-15 DIAGNOSIS — E781 Pure hyperglyceridemia: Secondary | ICD-10-CM

## 2022-10-15 DIAGNOSIS — I10 Essential (primary) hypertension: Secondary | ICD-10-CM

## 2022-10-17 ENCOUNTER — Telehealth: Payer: Self-pay | Admitting: Family Medicine

## 2022-10-17 DIAGNOSIS — H60502 Unspecified acute noninfective otitis externa, left ear: Secondary | ICD-10-CM

## 2022-10-17 DIAGNOSIS — G8929 Other chronic pain: Secondary | ICD-10-CM

## 2022-10-17 NOTE — Telephone Encounter (Signed)
Patient aware.

## 2022-10-17 NOTE — Telephone Encounter (Signed)
Patient was referred to ENT in [place] and said that she does not want to go to [place]. Can she be sent somewhere in [place]?

## 2022-10-17 NOTE — Telephone Encounter (Signed)
Patient would like to see if she can be referred to another ENT that is in [place].  She cannot get to [place].  Please call.

## 2022-10-18 NOTE — Telephone Encounter (Signed)
Referral placed.

## 2022-11-04 ENCOUNTER — Other Ambulatory Visit: Payer: Self-pay | Admitting: Family Medicine

## 2022-11-04 DIAGNOSIS — I10 Essential (primary) hypertension: Secondary | ICD-10-CM

## 2022-11-06 ENCOUNTER — Other Ambulatory Visit: Payer: Self-pay | Admitting: Family Medicine

## 2022-11-21 ENCOUNTER — Ambulatory Visit (INDEPENDENT_AMBULATORY_CARE_PROVIDER_SITE_OTHER): Payer: Medicare Other | Admitting: Family Medicine

## 2022-11-21 ENCOUNTER — Encounter: Payer: Self-pay | Admitting: Family Medicine

## 2022-11-21 VITALS — BP 119/75 | HR 96 | Temp 97.3°F | Ht 65.0 in | Wt 168.2 lb

## 2022-11-21 DIAGNOSIS — I10 Essential (primary) hypertension: Secondary | ICD-10-CM | POA: Diagnosis not present

## 2022-11-21 DIAGNOSIS — E781 Pure hyperglyceridemia: Secondary | ICD-10-CM

## 2022-11-21 DIAGNOSIS — M159 Polyosteoarthritis, unspecified: Secondary | ICD-10-CM

## 2022-11-21 DIAGNOSIS — E876 Hypokalemia: Secondary | ICD-10-CM | POA: Diagnosis not present

## 2022-11-21 DIAGNOSIS — M5136 Other intervertebral disc degeneration, lumbar region: Secondary | ICD-10-CM | POA: Diagnosis not present

## 2022-11-21 DIAGNOSIS — Z79899 Other long term (current) drug therapy: Secondary | ICD-10-CM | POA: Diagnosis not present

## 2022-11-21 MED ORDER — HYDROCODONE-ACETAMINOPHEN 5-325 MG PO TABS: 1.0000 | ORAL_TABLET | Freq: Four times a day (QID) | ORAL | 0 refills | Status: DC | PRN

## 2022-11-21 NOTE — Progress Notes (Signed)
Established Patient Office Visit  Subjective:  Patient ID: Kathy Barr, female    DOB: [birth year]  Age: 66 y.o. MRN: [id number]  CC:  Chief Complaint  Patient presents with   Medical Management of Chronic Issues   Hypertension    HPI Kathy Barr presents for chronic follow up.  HTN Complaint with meds - Yes Current Medications - losartan and HCTZ  Checking BP at home ranging- hasn't been checking Exercising Regularly - occasionally, stays active during the day Watching Salt intake - Yes Pertinent ROS:  Headache - No Fatigue - No Visual Disturbances - No Chest pain - No Dyspnea - No Palpitations - No LE edema - No They report good compliance with medications and can restate their regimen by memory. No medication side effects.  BP Readings from Last 3 Encounters:  11/21/22 119/75  09/18/22 (!) 117/58  08/28/22 114/71   2. Generalized OA DDD Pain assessment: Cause of pain- generalized OA Pain location- all joints, mostly shoulder, hips, back Pain on scale of 1-10:  7-8 She takes her pain medication 2x a day some days and not all other days. She also takes meloxicam and plain tylenol prn. She also uses topical pain ointments. She has tried PT multiple times. She has had numerous joint injections. She has been seen by ortho recently and they have discussed hip injections Frequency- daily What increases pain- activity, bending, twisting What makes pain Better-rest, pain medicine improves pain to 2/10 Effects on ADL - difficulty doing housework,  Any change in general medical condition-no  Current opioids rx- Norco q 6 hours prn # meds rx- 30 tablets monthly Effectiveness of current meds- brings pain down to a 2/10 Adverse reactions from pain meds- denies Morphine equivalent- 150 mme per Rx, 5 mme per day avg  Pill count performed-No Last drug screen - 02/15/22 ( high risk q38m moderate risk q617mlow risk yearly ) Urine drug screen today- No [place]- [place]  If yes were their any concerning findings? - no  Overdose risk: 140    02/15/2022   11:22 AM  Opioid Risk   Alcohol 0  Illegal Drugs 0  Rx Drugs 0  Alcohol 0  Illegal Drugs 0  Rx Drugs 0  Age between 16-45 years  0  History of Preadolescent Sexual Abuse 0  Psychological Disease 0  Depression 0  Opioid Risk Tool Scoring 0  Opioid Risk Interpretation Low Risk   Pain contract signed on: 02/15/22      Past Medical History:  Diagnosis Date   Arthritis    "all over" (03/18/2013)   Carpal tunnel syndrome    "right" (03/18/2013)   Fracture 2013   RLE   GERD (gastroesophageal reflux disease)    OTC   Hypertension    IBS (irritable bowel syndrome)    Osteoarthritis of left hip    Osteopenia 05/28/2021   [place]   "when I was little; when I was 7 months pregnant" (03/18/2013)   Shortness of breath    "not since I quit smoking" (03/18/2013)    Past Surgical History:  Procedure Laterality Date   ANTERIOR CERVICAL DECOMP/DISCECTOMY FUSION  ?2003   ANTERIOR CERVICAL DECOMP/DISCECTOMY FUSION N/A 04/24/2018   Procedure: C6-7 PLATE REMOVAL, C4V9-1C5-6 ANTERIOR CERVICAL DECOMPRESSION/DISCECTOMY & FUSION, ALLOGRAFT, PLATE;  Surgeon: YaMarybelle KillingsMD;  [facility]: [facility];  Laterality: N/A;   CHOLECYSTECTOMY  2003   COLONOSCOPY N/A 02/17/2014   Procedure:  COLONOSCOPY;  Surgeon: Rogene Houston, MD;  [facility];  Service: Endoscopy;  Laterality: N/A;  145-moved to 10:30 Ann to notify pt   COLONOSCOPY N/A 08/25/2019   Procedure: COLONOSCOPY;  Surgeon: Rogene Houston, MD;  [facility];  Service: Endoscopy;  Laterality: N/A;  1030   FOOT ARTHROTOMY Right 1990's   pin, after removing a bone    JOINT REPLACEMENT Left    hip and right   pinched nerve     POLYPECTOMY  08/25/2019   Procedure: POLYPECTOMY;  Surgeon: Rogene Houston, MD;  [facility];  Service: Endoscopy;;   TOTAL HIP ARTHROPLASTY   06/30/2012   Procedure: TOTAL HIP ARTHROPLASTY;  Surgeon: Jessy Oto, MD;  Location: [facility];  Service: Orthopedics;  Laterality: Left;  Left total hip replacement with metal and polypropylene pore coated implants   TOTAL HIP ARTHROPLASTY Right 03/16/2013   Procedure: RIGHT TOTAL HIP ARTHROPLASTY- right ;  Surgeon: Jessy Oto, MD;  Location: [facility];  Service: Orthopedics;  Laterality: Right;   TUBAL LIGATION  1978   VAGINAL HYSTERECTOMY  ~ 02/08/02    Family History  Problem Relation Age of Onset   Hypertension Father    Diabetes Father    Hypertension Sister    Diabetes Sister    Hypertension Brother    Diabetes Brother    Diabetes Mother    Hypertension Mother    Hyperlipidemia Mother    COPD Brother    Heart disease Brother    Hypertension Brother     Social History   Socioeconomic History   Marital status: Divorced    Spouse name: Not on file   Number of children: 1   Years of education: 9   Highest education level: 9th grade  Occupational History    Employer: DISABLED  Tobacco Use   Smoking status: Former    Packs/day: 0.25    Years: 25.00    Total pack years: 6.25    Types: Cigarettes   Smokeless tobacco: Never   Tobacco comments:    Light smoker - has tried to quit, but always starts back  Vaping Use   Vaping Use: Never used  Substance and Sexual Activity   Alcohol use: No    Alcohol/week: 0.0 standard drinks of alcohol   Drug use: No   Sexual activity: Yes  Other Topics Concern   Not on file  Social History Narrative   Her significant other of 15 years passed away in Feb 09, 2020   06-20-2022 - She recently adopted a one year old. (From her ex-fiance's grandchild who's parents didn't take care of her well and she was taken by social services)   Daughter and mother live nearby   Social Determinants of Health   Financial Resource Strain: Low Risk  (06/20/22)   Overall Financial Resource Strain (CARDIA)    Difficulty of Paying Living Expenses: Not very hard   Food Insecurity: No Food Insecurity (June 20, 2022)   Hunger Vital Sign    Worried About Running Out of Food in the Last Year: Never true    Ran Out of Food in the Last Year: Never true  Transportation Needs: No Transportation Needs (06/20/2022)   PRAPARE - Hydrologist (Medical): No    Lack of Transportation (Non-Medical): No  Physical Activity: Insufficiently Active (June 20, 2022)   Exercise Vital Sign    Days of Exercise per Week: 7 days    Minutes of Exercise per Session:  20 min  Stress: No Stress Concern Present (05/29/2022)   [place]    Feeling of Stress : Not at all  Social Connections: Socially Isolated (05/29/2022)   Social Connection and Isolation Panel [NHANES]    Frequency of Communication with Friends and Family: More than three times a week    Frequency of Social Gatherings with Friends and Family: More than three times a week    Attends Religious Services: Never    Marine scientist or Organizations: No    Attends Archivist Meetings: Never    Marital Status: Divorced  Human resources officer Violence: Not At Risk (05/29/2022)   Humiliation, Afraid, Rape, and Kick questionnaire    Fear of Current or Ex-Partner: No    Emotionally Abused: No    Physically Abused: No    Sexually Abused: No    Outpatient Medications Prior to Visit  Medication Sig Dispense Refill   albuterol (VENTOLIN HFA) 108 (90 Base) MCG/ACT inhaler INHALE 2 PUFFS INTO LUNGS EVERY 6 HOURS AS NEEDED FOR WHEEZING OR SHORTNESS OF BREATH 18 g 2   Calcium Carb-Cholecalciferol (CALCIUM 600+D3 PO) Take 1 tablet by mouth 2 (two) times daily.     hydrochlorothiazide (HYDRODIURIL) 25 MG tablet Take 1 tablet by mouth once daily 90 tablet 0   HYDROcodone-acetaminophen (NORCO) 5-325 MG tablet Take 1 tablet by mouth every 6 (six) hours as needed for moderate pain. 30 tablet 0   loratadine (CLARITIN) 10 MG tablet Take 10  mg by mouth daily.     losartan (COZAAR) 50 MG tablet Take 1 tablet by mouth once daily 90 tablet 0   meloxicam (MOBIC) 7.5 MG tablet Take 1 tablet (7.5 mg total) by mouth daily. 90 tablet 0   naloxone (NARCAN) nasal spray 4 mg/0.1 mL Nasally for overdose 1 each 3   potassium chloride (KLOR-CON M) 10 MEQ tablet Take 1 tablet (10 mEq total) by mouth daily. 90 tablet 1   No facility-administered medications prior to visit.    No Known Allergies  ROS Review of Systems As per HPI.    Objective:    Physical Exam Vitals and nursing note reviewed.  Constitutional:      General: She is not in acute distress.    Appearance: Normal appearance. She is not ill-appearing, toxic-appearing or diaphoretic.  HENT:     Head: Normocephalic and atraumatic.     Mouth/Throat:     Mouth: Mucous membranes are moist.     Pharynx: Oropharynx is clear.  Eyes:     Extraocular Movements: Extraocular movements intact.     Conjunctiva/sclera: Conjunctivae normal.     Pupils: Pupils are equal, round, and reactive to light.  Cardiovascular:     Rate and Rhythm: Normal rate and regular rhythm.     Pulses: Normal pulses.     Heart sounds: Normal heart sounds. No murmur heard. Pulmonary:     Effort: Pulmonary effort is normal. No respiratory distress.     Breath sounds: Normal breath sounds.  Musculoskeletal:        General: No swelling.     Lumbar back: Tenderness present. No swelling, edema, deformity, signs of trauma or bony tenderness.     Right lower leg: No edema.     Left lower leg: No edema.  Skin:    General: Skin is warm and dry.  Neurological:     General: No focal deficit present.     Mental Status: She is  alert and oriented to person, place, and time.  Psychiatric:        Mood and Affect: Mood normal.        Behavior: Behavior normal.        Thought Content: Thought content normal.        Judgment: Judgment normal.     BP 119/75   Pulse 96   Temp (!) 97.3 F (36.3 C) (Temporal)    Ht _0  (1.651 m)   Wt 168 lb 4 oz (76.3 kg)   SpO2 97%   BMI 28.00 kg/m  Wt Readings from Last 3 Encounters:  11/21/22 168 lb 4 oz (76.3 kg)  09/18/22 171 lb (77.6 kg)  08/28/22 170 lb 9.6 oz (77.4 kg)     There are no preventive care reminders to display for this patient.    There are no preventive care reminders to display for this patient.  Lab Results  Component Value Date   TSH 1.680 02/15/2022   Lab Results  Component Value Date   WBC 6.5 08/28/2022   HGB 13.1 08/28/2022   HCT 38.4 08/28/2022   MCV 83 08/28/2022   PLT 215 08/28/2022   Lab Results  Component Value Date   NA 138 08/28/2022   K 3.7 09/20/2022   CO2 26 08/28/2022   GLUCOSE 91 08/28/2022   BUN 9 08/28/2022   CREATININE 0.66 08/28/2022   BILITOT 0.8 08/28/2022   ALKPHOS 94 08/28/2022   AST 18 08/28/2022   ALT 8 08/28/2022   PROT 6.8 08/28/2022   ALBUMIN 4.2 08/28/2022   CALCIUM 9.1 08/28/2022   ANIONGAP 11 04/10/2018   EGFR 96 08/28/2022   Lab Results  Component Value Date   CHOL 173 02/15/2022   Lab Results  Component Value Date   HDL 57 02/15/2022   Lab Results  Component Value Date   LDLCALC 90 02/15/2022   Lab Results  Component Value Date   TRIG 148 02/15/2022   Lab Results  Component Value Date   CHOLHDL 3.0 02/15/2022   Lab Results  Component Value Date   HGBA1C 4.6 (L) 05/16/2022      Assessment & Plan:   Mehr was seen today for medical management of chronic issues and hypertension.  Diagnoses and all orders for this visit:  Primary hypertension Well controlled on current regimen. Continue losartan and HCTZ.   Hypertriglyceridemia Last panel at goal. Diet and exercise.   Hypokalemia On supplement. BMP pending.  -     BMP8+EGFR  DDD (degenerative disc disease), lumbar Generalized OA Controlled substance agreement signed PDMP reviewed, no red flags. Refills provided. CSA and UDS are UTD. She will follow up with ortho to discuss injection. Continue  supportive measures.   -     HYDROcodone-acetaminophen (NORCO) 5-325 MG tablet; Take 1 tablet by mouth every 6 (six) hours as needed for moderate pain. -     HYDROcodone-acetaminophen (NORCO) 5-325 MG tablet; Take 1 tablet by mouth every 6 (six) hours as needed for moderate pain. -     HYDROcodone-acetaminophen (NORCO) 5-325 MG tablet; Take 1 tablet by mouth every 6 (six) hours as needed for moderate pain.   Follow-up: Return in about 3 months (around 02/20/2023) for chronic follow up.   The patient indicates understanding of these issues and agrees with the plan.  Gwenlyn Perking, FNP

## 2022-11-22 ENCOUNTER — Other Ambulatory Visit: Payer: Self-pay | Admitting: Family Medicine

## 2022-11-22 DIAGNOSIS — E876 Hypokalemia: Secondary | ICD-10-CM

## 2022-11-22 LAB — BMP8+EGFR
BUN/Creatinine Ratio: 10 — ABNORMAL LOW (ref 12–28)
BUN: 7 mg/dL — ABNORMAL LOW (ref 8–27)
CO2: 25 mmol/L (ref 20–29)
Calcium: 9.6 mg/dL (ref 8.7–10.3)
Chloride: 100 mmol/L (ref 96–106)
Creatinine, Ser: 0.72 mg/dL (ref 0.57–1.00)
Glucose: 97 mg/dL (ref 70–99)
Potassium: 3.9 mmol/L (ref 3.5–5.2)
Sodium: 142 mmol/L (ref 134–144)
eGFR: 92 mL/min/{1.73_m2} (ref >59)

## 2022-11-22 MED ORDER — POTASSIUM CHLORIDE CRYS ER 10 MEQ PO TBCR: 10.0000 meq | EXTENDED_RELEASE_TABLET | Freq: Every day | ORAL | 1 refills | Status: DC

## 2022-12-26 ENCOUNTER — Encounter: Payer: Self-pay | Admitting: Orthopaedic Surgery

## 2022-12-26 ENCOUNTER — Ambulatory Visit (INDEPENDENT_AMBULATORY_CARE_PROVIDER_SITE_OTHER): Payer: 59

## 2022-12-26 ENCOUNTER — Ambulatory Visit (INDEPENDENT_AMBULATORY_CARE_PROVIDER_SITE_OTHER): Payer: 59 | Admitting: Orthopaedic Surgery

## 2022-12-26 VITALS — Ht 64.0 in | Wt 167.0 lb

## 2022-12-26 DIAGNOSIS — M25562 Pain in left knee: Secondary | ICD-10-CM | POA: Diagnosis not present

## 2022-12-26 DIAGNOSIS — M1711 Unilateral primary osteoarthritis, right knee: Secondary | ICD-10-CM | POA: Diagnosis not present

## 2022-12-26 DIAGNOSIS — M25551 Pain in right hip: Secondary | ICD-10-CM

## 2022-12-26 MED ORDER — BUPIVACAINE HCL 0.25 % IJ SOLN
4.0000 mL | INTRAMUSCULAR | Status: AC | PRN
  Administered 2022-12-26: 4 mL via INTRA_ARTICULAR

## 2022-12-26 MED ORDER — METHYLPREDNISOLONE ACETATE 40 MG/ML IJ SUSP
40.0000 mg | INTRAMUSCULAR | Status: AC | PRN
  Administered 2022-12-26: 40 mg via INTRA_ARTICULAR

## 2022-12-26 MED ORDER — LIDOCAINE HCL 1 % IJ SOLN
0.5000 mL | INTRAMUSCULAR | Status: AC | PRN
  Administered 2022-12-26: .5 mL

## 2022-12-26 NOTE — Progress Notes (Signed)
Office Visit Note   Patient: Kathy Barr           Date of Birth: Aug 15, 1956           MRN: [id number] Visit Date: 12/26/2022              Requested by: Gwenlyn Perking, [place],  [place] [address] PCP: Gwenlyn Perking, FNP   Assessment & Plan: Visit Diagnoses:  1. Pain in right hip   2. Acute pain of left knee     Plan: Injection performed recheck 6 weeks.  Today we discussed total knee arthroplasty and hopefully the injection will settle down her symptoms.  Follow-Up Instructions: No follow-ups on file.   Orders:  Orders Placed This Encounter  Procedures   XR HIP UNILAT W OR W/O PELVIS 2-3 VIEWS RIGHT   XR KNEE 3 VIEW LEFT   No orders of the defined types were placed in this encounter.     Procedures: Large Joint Inj: R knee on 12/26/2022 3:10 PM Indications: pain and joint swelling Details: 22 G 1.5 in needle, anterolateral approach  Arthrogram: No  Medications: 40 mg methylPREDNISolone acetate 40 MG/ML; 0.5 mL lidocaine 1 %; 4 mL bupivacaine 0.25 % Outcome: tolerated well, no immediate complications Procedure, treatment alternatives, risks and benefits explained, specific risks discussed. Consent was given by the patient. Immediately prior to procedure a time out was called to verify the correct patient, procedure, equipment, support staff and site/side marked as required. Patient was prepped and draped in the usual sterile fashion.       Clinical Data: No additional findings.   Subjective: Chief Complaint  Patient presents with   Right Hip - Pain   Left Knee - Pain    HPI 67 year old female returns she has had previous total hip arthroplasties by Dr. Louanne Skye 2013 2014 left and right both doing well.  She is having pain in her right knee has noted some progressive deformity of the knee with right genu varus and medial joint line pain.  She states she is taking Norco for arthritis and does not know why her knee is bothering her if she is  already on Norco.  She has noticed some groin pain and is concerned about her hips which have now been in 9 and 10 years respectively.  Review of Systems systems noncontributory to HPI.   Objective: Vital Signs: Ht '5\' 4"'$  (1.626 m)   Wt 167 lb (75.8 kg)   BMI 28.67 kg/m   Physical Exam Constitutional:      Appearance: She is well-developed.  HENT:     Head: Normocephalic.     Right Ear: External ear normal.     Left Ear: External ear normal. There is no impacted cerumen.  Eyes:     Pupils: Pupils are equal, round, and reactive to light.  Neck:     Thyroid: No thyromegaly.     Trachea: No tracheal deviation.  Cardiovascular:     Rate and Rhythm: Normal rate.  Pulmonary:     Effort: Pulmonary effort is normal.  Abdominal:     Palpations: Abdomen is soft.  Musculoskeletal:     Cervical back: No rigidity.  Skin:    General: Skin is warm and dry.  Neurological:     Mental Status: She is alert and oriented to person, place, and time.  Psychiatric:        Behavior: Behavior normal.     Ortho Exam healed posterior  hip incisions.  Right knee has pain with range of motion no pain with internal/external rotation of her hip mild tenderness to the trochanter and right sciatic notch more than left.  Negative straight leg raising 90 degrees.  Specialty Comments:  No specialty comments available.  Imaging: No results found.   PMFS History: Patient Active Problem List   Diagnosis Date Noted   Hypokalemia 11/21/2022   Chondromalacia patellae, right knee 05/23/2022   Mild intermittent asthma without complication 78/24/2353   Osteopenia 05/28/2021   Controlled substance agreement signed 02/23/2021   Hx of colonic polyps 02/15/2019   Fusion of spine of cervical region 04/24/2018   Other spondylosis with radiculopathy, cervical region 03/05/2018   Hypertriglyceridemia 08/27/2017   Vertigo 02/04/2017   Atrophic vulvovaginitis 06/05/2015   DDD (degenerative disc disease), lumbar  01/13/2015   IBS (irritable bowel syndrome) 07/12/2014   Osteoarthritis of right hip 03/16/2013    Class: Chronic   Generalized OA 10/29/2012   Primary hypertension 10/29/2012   Past Medical History:  Diagnosis Date   Arthritis    "all over" (03/18/2013)   Carpal tunnel syndrome    "right" (03/18/2013)   Fracture 2013   RLE   GERD (gastroesophageal reflux disease)    OTC   Hypertension    IBS (irritable bowel syndrome)    Osteoarthritis of left hip    Osteopenia 05/28/2021   Seizures ([place]) 1960's; 1978   "when I was little; when I was 7 months pregnant" (03/18/2013)   Shortness of breath    "not since I quit smoking" (03/18/2013)    Family History  Problem Relation Age of Onset   Hypertension Father    Diabetes Father    Hypertension Sister    Diabetes Sister    Hypertension Brother    Diabetes Brother    Diabetes Mother    Hypertension Mother    Hyperlipidemia Mother    COPD Brother    Heart disease Brother    Hypertension Brother     Past Surgical History:  Procedure Laterality Date   ANTERIOR CERVICAL DECOMP/DISCECTOMY FUSION  ?2003   ANTERIOR CERVICAL DECOMP/DISCECTOMY FUSION N/A 04/24/2018   Procedure: C6-7 PLATE REMOVAL, I1-4, C5-6 ANTERIOR CERVICAL DECOMPRESSION/DISCECTOMY & FUSION, ALLOGRAFT, PLATE;  Surgeon: Marybelle Killings, MD;  Location: [facility];  Service: Orthopedics;  Laterality: N/A;   CHOLECYSTECTOMY  2003   COLONOSCOPY N/A 02/17/2014   Procedure: COLONOSCOPY;  Surgeon: Rogene Houston, MD;  [facility];  Service: Endoscopy;  Laterality: N/A;  145-moved to 10:30 Ann to notify pt   COLONOSCOPY N/A 08/25/2019   Procedure: COLONOSCOPY;  Surgeon: Rogene Houston, MD;  [facility];  Service: Endoscopy;  Laterality: N/A;  1030   FOOT ARTHROTOMY Right 1990's   pin, after removing a bone    JOINT REPLACEMENT Left    hip and right   pinched nerve     POLYPECTOMY  08/25/2019   Procedure: POLYPECTOMY;  Surgeon: Rogene Houston, MD;  [facility];  Service: Endoscopy;;   TOTAL HIP ARTHROPLASTY  06/30/2012   Procedure: TOTAL HIP ARTHROPLASTY;  Surgeon: Jessy Oto, MD;  Location: [facility];  Service: Orthopedics;  Laterality: Left;  Left total hip replacement with metal and polypropylene pore coated implants   TOTAL HIP ARTHROPLASTY Right 03/16/2013   Procedure: RIGHT TOTAL HIP ARTHROPLASTY- right ;  Surgeon: Jessy Oto, MD;  Location: [facility];  Service: Orthopedics;  Laterality: Right;   [place]  VAGINAL HYSTERECTOMY  ~ 2003   Social History   Occupational History    Employer: DISABLED  Tobacco Use   Smoking status: Former    Packs/day: 0.25    Years: 25.00    Total pack years: 6.25    Types: Cigarettes   Smokeless tobacco: Never   Tobacco comments:    Light smoker - has tried to quit, but always starts back  Vaping Use   Vaping Use: Never used  Substance and Sexual Activity   Alcohol use: No    Alcohol/week: 0.0 standard drinks of alcohol   Drug use: No   Sexual activity: Yes

## 2023-02-06 ENCOUNTER — Encounter: Payer: Self-pay | Admitting: Orthopaedic Surgery

## 2023-02-06 ENCOUNTER — Ambulatory Visit (INDEPENDENT_AMBULATORY_CARE_PROVIDER_SITE_OTHER): Payer: 59 | Admitting: Orthopaedic Surgery

## 2023-02-06 VITALS — Ht 64.0 in | Wt 167.0 lb

## 2023-02-06 DIAGNOSIS — R1031 Right lower quadrant pain: Secondary | ICD-10-CM

## 2023-02-06 NOTE — Progress Notes (Signed)
Office Visit Note   Patient: Kathy Barr           Date of Birth: 07/07/1956           MRN: [id number] Visit Date: 02/06/2023              Requested by: Gwenlyn Perking, [place],  [place] [id number] PCP: Gwenlyn Perking, FNP   Assessment & Plan: Visit Diagnoses:  1. Right groin pain     Plan: Reviewed previous knee radiographs with arthritic changes.  No problems with total hip arthroplasty without loosening of subsidence and no evidence of polyethylene wear.  She is not limping.  She was happy that her hip arthroplasty continues to do well.  She can return if she has progressive symptoms or change in symptoms.  Follow-Up Instructions: No follow-ups on file.   Orders:  No orders of the defined types were placed in this encounter.  No orders of the defined types were placed in this encounter.     Procedures: No procedures performed   Clinical Data: No additional findings.   Subjective: Chief Complaint  Patient presents with   Right Hip - Follow-up   Right Knee - Pain, Follow-up    HPI 67 year old female 6-week follow-up with ongoing symptoms in her right knee.  She had total arthroplasties done by Dr. Louanne Skye.  Recently she has had some right groin pain and some discomfort with hip flexion.  She states night health care nurse came out to her house and told her she had neuropathy of her lower extremities bilaterally.  She is on chronic pain management on long-term Norco.  Patient is concerned she has some hip problems with her groin pain.  X-rays 12/26/2022 showed satisfactory left total hip arthroplasty.  Right total of arthroplasty without loosening or subsidence.  Knee radiographs show bone-on-bone changes medial compartment with marginal osteophytes and flattening the femoral condyle.  Right knee changes greater than left.  Review of Systems 14 point system update unchanged.   Objective: Vital Signs: Ht '5\' 4"'$  (1.626 m)   Wt 167 lb (75.8 kg)   BMI  28.67 kg/m   Physical Exam Constitutional:      Appearance: She is well-developed.  HENT:     Head: Normocephalic.     Right Ear: External ear normal.     Left Ear: External ear normal. There is no impacted cerumen.  Eyes:     Pupils: Pupils are equal, round, and reactive to light.  Neck:     Thyroid: No thyromegaly.     Trachea: No tracheal deviation.  Cardiovascular:     Rate and Rhythm: Normal rate.  Pulmonary:     Effort: Pulmonary effort is normal.  Abdominal:     Palpations: Abdomen is soft.  Musculoskeletal:     Cervical back: No rigidity.  Skin:    General: Skin is warm and dry.  Neurological:     Mental Status: She is alert and oriented to person, place, and time.  Psychiatric:        Behavior: Behavior normal.     Ortho Exam negative logroll of the hips right and left.  Negative straight leg raising.  Well-healed midline knee incision.  Negative straight leg raising 90 degrees.  Specialty Comments:  No specialty comments available.  Imaging: No results found.   PMFS History: Patient Active Problem List   Diagnosis Date Noted   Right groin pain 02/07/2023   Hypokalemia 11/21/2022  Chondromalacia patellae, right knee 05/23/2022   Mild intermittent asthma without complication [id number]   Osteopenia 05/28/2021   Controlled substance agreement signed 02/23/2021   Hx of colonic polyps 02/15/2019   Fusion of spine of cervical region 04/24/2018   Other spondylosis with radiculopathy, cervical region 03/05/2018   Hypertriglyceridemia 08/27/2017   Vertigo 02/04/2017   Atrophic vulvovaginitis 06/05/2015   DDD (degenerative disc disease), lumbar 01/13/2015   IBS (irritable bowel syndrome) 07/12/2014   Osteoarthritis of right hip 03/16/2013    Class: Chronic   Generalized OA 10/29/2012   Primary hypertension 10/29/2012   Past Medical History:  Diagnosis Date   Arthritis    "all over" (03/18/2013)   Carpal tunnel syndrome    "right" (03/18/2013)    Fracture 2013   RLE   GERD (gastroesophageal reflux disease)    OTC   Hypertension    IBS (irritable bowel syndrome)    Osteoarthritis of left hip    Osteopenia 05/28/2021   Seizures ([place]) 1960's; 1978   "when I was little; when I was 7 months pregnant" (03/18/2013)   Shortness of breath    "not since I quit smoking" (03/18/2013)    Family History  Problem Relation Age of Onset   Hypertension Father    Diabetes Father    Hypertension Sister    Diabetes Sister    Hypertension Brother    Diabetes Brother    Diabetes Mother    Hypertension Mother    Hyperlipidemia Mother    COPD Brother    Heart disease Brother    Hypertension Brother     Past Surgical History:  Procedure Laterality Date   ANTERIOR CERVICAL DECOMP/DISCECTOMY FUSION  ?2003   ANTERIOR CERVICAL DECOMP/DISCECTOMY FUSION N/A 04/24/2018   Procedure: C6-7 PLATE REMOVAL, [id number], C5-6 ANTERIOR CERVICAL DECOMPRESSION/DISCECTOMY & FUSION, ALLOGRAFT, PLATE;  Surgeon: Marybelle Killings, MD;  Location: [facility];  Service: Orthopedics;  Laterality: N/A;   CHOLECYSTECTOMY  2003   COLONOSCOPY N/A 02/17/2014   Procedure: COLONOSCOPY;  Surgeon: Rogene Houston, MD;  [facility];  Service: Endoscopy;  Laterality: N/A;  145-moved to 10:30 Ann to notify pt   COLONOSCOPY N/A 08/25/2019   Procedure: COLONOSCOPY;  Surgeon: Rogene Houston, MD;  [facility];  Service: Endoscopy;  Laterality: N/A;  1030   FOOT ARTHROTOMY Right 1990's   pin, after removing a bone    JOINT REPLACEMENT Left    hip and right   pinched nerve     POLYPECTOMY  08/25/2019   Procedure: POLYPECTOMY;  Surgeon: Rogene Houston, MD;  [facility];  Service: Endoscopy;;   TOTAL HIP ARTHROPLASTY  06/30/2012   Procedure: TOTAL HIP ARTHROPLASTY;  Surgeon: Jessy Oto, MD;  Location: [facility];  Service: Orthopedics;  Laterality: Left;  Left total hip replacement with metal and polypropylene pore coated implants   TOTAL HIP ARTHROPLASTY Right  03/16/2013   Procedure: RIGHT TOTAL HIP ARTHROPLASTY- right ;  Surgeon: Jessy Oto, MD;  Location: [facility];  Service: Orthopedics;  Laterality: Right;   TUBAL LIGATION  1978   VAGINAL HYSTERECTOMY  ~ 2003   Social History   Occupational History    Employer: DISABLED  Tobacco Use   Smoking status: Former    Packs/day: 0.25    Years: 25.00    Total pack years: 6.25    Types: Cigarettes   Smokeless tobacco: Never   Tobacco comments:    Light smoker - has tried to quit, but always  starts back  Vaping Use   Vaping Use: Never used  Substance and Sexual Activity   Alcohol use: No    Alcohol/week: 0.0 standard drinks of alcohol   Drug use: No   Sexual activity: Yes

## 2023-02-07 DIAGNOSIS — R1031 Right lower quadrant pain: Secondary | ICD-10-CM | POA: Insufficient documentation

## 2023-02-10 ENCOUNTER — Telehealth: Payer: Self-pay | Admitting: Family Medicine

## 2023-02-10 DIAGNOSIS — I10 Essential (primary) hypertension: Secondary | ICD-10-CM

## 2023-02-10 MED ORDER — LOSARTAN POTASSIUM 50 MG PO TABS: 50.0000 mg | ORAL_TABLET | Freq: Every day | ORAL | 0 refills | Status: DC

## 2023-02-10 NOTE — Telephone Encounter (Signed)
  Prescription Request  02/10/2023  Is this a "Controlled Substance" medicine? losartan (COZAAR) 50 MG tablet   Have you seen your PCP in the last 2 weeks? 02/20/2023  If YES, route message to pool  -  If NO, patient needs to be scheduled for appointment.  What is the name of the medication or equipment? losartan (COZAAR) 50 MG tablet   Have you contacted your pharmacy to request a refill? no   Which pharmacy would you like this sent to? [facility]   Patient notified that their request is being sent to the clinical staff for review and that they should receive a response within 2 business days.

## 2023-02-10 NOTE — Telephone Encounter (Signed)
LMOVM refill sent to pharmacy 

## 2023-02-20 ENCOUNTER — Ambulatory Visit (INDEPENDENT_AMBULATORY_CARE_PROVIDER_SITE_OTHER): Payer: 59 | Admitting: Family Medicine

## 2023-02-20 ENCOUNTER — Encounter: Payer: Self-pay | Admitting: Family Medicine

## 2023-02-20 VITALS — BP 114/74 | HR 97 | Temp 98.6°F | Ht 64.0 in | Wt 160.0 lb

## 2023-02-20 DIAGNOSIS — Z79899 Other long term (current) drug therapy: Secondary | ICD-10-CM | POA: Diagnosis not present

## 2023-02-20 DIAGNOSIS — E876 Hypokalemia: Secondary | ICD-10-CM

## 2023-02-20 DIAGNOSIS — M5136 Other intervertebral disc degeneration, lumbar region: Secondary | ICD-10-CM | POA: Diagnosis not present

## 2023-02-20 DIAGNOSIS — E782 Mixed hyperlipidemia: Secondary | ICD-10-CM | POA: Diagnosis not present

## 2023-02-20 DIAGNOSIS — M159 Polyosteoarthritis, unspecified: Secondary | ICD-10-CM | POA: Diagnosis not present

## 2023-02-20 DIAGNOSIS — I10 Essential (primary) hypertension: Secondary | ICD-10-CM | POA: Diagnosis not present

## 2023-02-20 MED ORDER — HYDROCODONE-ACETAMINOPHEN 5-325 MG PO TABS: 1.0000 | ORAL_TABLET | Freq: Four times a day (QID) | ORAL | 0 refills | Status: DC | PRN

## 2023-02-20 MED ORDER — MELOXICAM 7.5 MG PO TABS: 7.5000 mg | ORAL_TABLET | Freq: Every day | ORAL | 0 refills | Status: DC

## 2023-02-20 MED ORDER — HYDROCHLOROTHIAZIDE 25 MG PO TABS: 25.0000 mg | ORAL_TABLET | Freq: Every day | ORAL | 3 refills | Status: DC

## 2023-02-20 NOTE — Progress Notes (Signed)
Established Patient Office Visit  Subjective:  Patient ID: Kathy Barr, female    DOB: [birth year]  Age: 67 y.o. MRN: [id number]  CC:  Chief Complaint  Patient presents with   Medical Management of Chronic Issues    3 month     HPI Kathy Barr presents for chronic follow up.  HTN Complaint with meds - Yes Current Medications - losartan and HCTZ  Checking BP at home ranging- hasn't been checking Exercising Regularly - occasionally, stays active during the day Watching Salt intake - Yes Pertinent ROS:  Headache - No Fatigue - No Visual Disturbances - No Chest pain - No Dyspnea - No Palpitations - No LE edema - No   2. Generalized OA DDD Pain assessment: Cause of pain- generalized OA Pain location- all joints, mostly shoulder, hips, back, knees, hands Pain on scale of 1-10:  8 She takes her pain medication 2x a day some days and not all other days. She has only been getting 30 tablets a month and this really hasn't been enough. She also takes meloxicam, biofeeze, and plain tylenol prn. She also uses topical pain ointments. She has tried PT multiple times. She has had numerous joint injections. She has been seen by ortho recently. She recently had a knee injection. She has started using a cane.  Frequency- daily What increases pain- activity, bending, twisting What makes pain Better-rest, pain medicine improves pain to 6/10 Effects on ADL - difficulty doing housework,  Any change in general medical condition-no  Current opioids rx- Norco q 6 hours prn # meds rx- 30 tablets monthly Effectiveness of current meds- brings pain down to a 2/10 Adverse reactions from pain meds- denies Morphine equivalent- 131 mme per Rx, 4.4 mme per day avg  Pill count performed-No Last drug screen - 02/15/22 ( high risk q100m moderate risk q643mlow risk yearly ) Urine drug screen today- No Was the [place]- yes  If yes were their any concerning findings? - no  Overdose risk:  140    02/15/2022   11:22 AM  Opioid Risk   Alcohol 0  Illegal Drugs 0  Rx Drugs 0  Alcohol 0  Illegal Drugs 0  Rx Drugs 0  Age between 16-45 years  0  History of Preadolescent Sexual Abuse 0  Psychological Disease 0  Depression 0  Opioid Risk Tool Scoring 0  Opioid Risk Interpretation Low Risk   Pain contract signed on: 02/15/22    Past Medical History:  Diagnosis Date   Arthritis    "all over" (03/18/2013)   Carpal tunnel syndrome    "right" (03/18/2013)   Fracture 2013   RLE   GERD (gastroesophageal reflux disease)    OTC   Hypertension    IBS (irritable bowel syndrome)    Osteoarthritis of left hip    Osteopenia 05/28/2021   [place]; [place]   "when I was little; when I was 7 months pregnant" (03/18/2013)   Shortness of breath    "not since I quit smoking" (03/18/2013)    Past Surgical History:  Procedure Laterality Date   ANTERIOR CERVICAL DECOMP/DISCECTOMY FUSION  ?2003   ANTERIOR CERVICAL DECOMP/DISCECTOMY FUSION N/A 04/24/2018   Procedure: C6-7 PLATE REMOVAL, [id number] ANTERIOR CERVICAL DECOMPRESSION/DISCECTOMY & FUSION, ALLOGRAFT, PLATE;  Surgeon: YaMarybelle KillingsMD;  [place] [facility] Service: [place];  Laterality: N/A;   CHOLECYSTECTOMY  2003   COLONOSCOPY N/A 02/17/2014   Procedure: COLONOSCOPY;  Surgeon: NaRogene HoustonMD;  [facility];  Service: Endoscopy;  Laterality: N/A;  145-moved to 10:30 Ann to notify pt   COLONOSCOPY N/A 08/25/2019   Procedure: COLONOSCOPY;  Surgeon: Rogene Houston, MD;  [facility];  Service: Endoscopy;  Laterality: N/A;  1030   FOOT ARTHROTOMY Right 1990's   pin, after removing a bone    JOINT REPLACEMENT Left    hip and right   pinched nerve     POLYPECTOMY  08/25/2019   Procedure: POLYPECTOMY;  Surgeon: Rogene Houston, MD;  [facility];  Service: Endoscopy;;   TOTAL HIP ARTHROPLASTY  06/30/2012   Procedure: TOTAL HIP ARTHROPLASTY;  Surgeon: Jessy Oto, MD;  Location:  [facility];  Service: Orthopedics;  Laterality: Left;  Left total hip replacement with metal and polypropylene pore coated implants   TOTAL HIP ARTHROPLASTY Right 03/16/2013   Procedure: RIGHT TOTAL HIP ARTHROPLASTY- right ;  Surgeon: Jessy Oto, MD;  Location: [facility];  Service: Orthopedics;  Laterality: Right;   TUBAL LIGATION  1978   VAGINAL HYSTERECTOMY  ~ 2002/03/08    Family History  Problem Relation Age of Onset   Hypertension Father    Diabetes Father    Hypertension Sister    Diabetes Sister    Hypertension Brother    Diabetes Brother    Diabetes Mother    Hypertension Mother    Hyperlipidemia Mother    COPD Brother    Heart disease Brother    Hypertension Brother     Social History   Socioeconomic History   Marital status: Divorced    Spouse name: Not on file   Number of children: 1   Years of education: 9   Highest education level: 9th grade  Occupational History    Employer: DISABLED  Tobacco Use   Smoking status: Former    Packs/day: 0.25    Years: 25.00    Additional pack years: 0.00    Total pack years: 6.25    Types: Cigarettes   Smokeless tobacco: Never   Tobacco comments:    Light smoker - has tried to quit, but always starts back  Vaping Use   Vaping Use: Never used  Substance and Sexual Activity   Alcohol use: No    Alcohol/week: 0.0 standard drinks of alcohol   Drug use: No   Sexual activity: Yes  Other Topics Concern   Not on file  Social History Narrative   Her significant other of 15 years passed away in 08-Mar-2020   06-12-22 - She recently adopted a one year old. (From her ex-fiance's grandchild who's parents didn't take care of her well and she was taken by social services)   Daughter and mother live nearby   Social Determinants of Health   Financial Resource Strain: Low Risk  (June 12, 2022)   Overall Financial Resource Strain (CARDIA)    Difficulty of Paying Living Expenses: Not very hard  Food Insecurity: No Food Insecurity (Jun 12, 2022)   Hunger  Vital Sign    Worried About Running Out of Food in the Last Year: Never true    Ran Out of Food in the Last Year: Never true  Transportation Needs: No Transportation Needs (06-12-2022)   PRAPARE - Hydrologist (Medical): No    Lack of Transportation (Non-Medical): No  Physical Activity: Insufficiently Active (06/12/22)   Exercise Vital Sign    Days of Exercise per Week: 7 days    Minutes of Exercise per Session: 20  min  Stress: No Stress Concern Present (05/29/2022)   [place]    Feeling of Stress : Not at all  Social Connections: Socially Isolated (05/29/2022)   Social Connection and Isolation Panel [NHANES]    Frequency of Communication with Friends and Family: More than three times a week    Frequency of Social Gatherings with Friends and Family: More than three times a week    Attends Religious Services: Never    Marine scientist or Organizations: No    Attends Archivist Meetings: Never    Marital Status: Divorced  Human resources officer Violence: Not At Risk (05/29/2022)   Humiliation, Afraid, Rape, and Kick questionnaire    Fear of Current or Ex-Partner: No    Emotionally Abused: No    Physically Abused: No    Sexually Abused: No    Outpatient Medications Prior to Visit  Medication Sig Dispense Refill   albuterol (VENTOLIN HFA) 108 (90 Base) MCG/ACT inhaler INHALE 2 PUFFS INTO LUNGS EVERY 6 HOURS AS NEEDED FOR WHEEZING OR SHORTNESS OF BREATH 18 g 2   Calcium Carb-Cholecalciferol (CALCIUM 600+D3 PO) Take 1 tablet by mouth 2 (two) times daily.     hydrochlorothiazide (HYDRODIURIL) 25 MG tablet Take 1 tablet by mouth once daily 90 tablet 0   HYDROcodone-acetaminophen (NORCO) 5-325 MG tablet Take 1 tablet by mouth every 6 (six) hours as needed for moderate pain. 30 tablet 0   loratadine (CLARITIN) 10 MG tablet Take 10 mg by mouth daily.     losartan (COZAAR) 50 MG tablet Take  1 tablet (50 mg total) by mouth daily. 90 tablet 0   naloxone (NARCAN) nasal spray 4 mg/0.1 mL Nasally for overdose 1 each 3   potassium chloride (KLOR-CON M) 10 MEQ tablet Take 1 tablet (10 mEq total) by mouth daily. 90 tablet 1   meloxicam (MOBIC) 7.5 MG tablet Take 1 tablet (7.5 mg total) by mouth daily. (Patient not taking: Reported on 02/20/2023) 90 tablet 0   No facility-administered medications prior to visit.    No Known Allergies  ROS Review of Systems As per HPI.    Objective:    Physical Exam Vitals and nursing note reviewed.  Constitutional:      General: She is not in acute distress.    Appearance: Normal appearance. She is not ill-appearing, toxic-appearing or diaphoretic.  HENT:     Head: Normocephalic and atraumatic.     Mouth/Throat:     Mouth: Mucous membranes are moist.     Pharynx: Oropharynx is clear.  Eyes:     Extraocular Movements: Extraocular movements intact.     Conjunctiva/sclera: Conjunctivae normal.     Pupils: Pupils are equal, round, and reactive to light.  Cardiovascular:     Rate and Rhythm: Normal rate and regular rhythm.     Pulses: Normal pulses.     Heart sounds: Normal heart sounds. No murmur heard. Pulmonary:     Effort: Pulmonary effort is normal. No respiratory distress.     Breath sounds: Normal breath sounds.  Musculoskeletal:        General: No swelling.     Lumbar back: Tenderness present. No swelling, edema, deformity, signs of trauma or bony tenderness.     Right lower leg: No edema.     Left lower leg: No edema.  Skin:    General: Skin is warm and dry.  Neurological:     General: No focal deficit present.  Mental Status: She is alert and oriented to person, place, and time.  Psychiatric:        Mood and Affect: Mood normal.        Behavior: Behavior normal.        Thought Content: Thought content normal.        Judgment: Judgment normal.     BP 114/74   Pulse 97   Temp 98.6 F (37 C)   Ht '5\' 4"'$  (1.626 m)    Wt 160 lb (72.6 kg)   SpO2 96%   BMI 27.46 kg/m  Wt Readings from Last 3 Encounters:  02/20/23 160 lb (72.6 kg)  02/06/23 167 lb (75.8 kg)  12/26/22 167 lb (75.8 kg)     There are no preventive care reminders to display for this patient.    There are no preventive care reminders to display for this patient.  Lab Results  Component Value Date   TSH 1.680 02/15/2022   Lab Results  Component Value Date   WBC 6.5 08/28/2022   HGB 13.1 08/28/2022   HCT 38.4 08/28/2022   MCV 83 08/28/2022   PLT 215 08/28/2022   Lab Results  Component Value Date   NA 142 11/21/2022   K 3.9 11/21/2022   CO2 25 11/21/2022   GLUCOSE 97 11/21/2022   BUN 7 (L) 11/21/2022   CREATININE 0.72 11/21/2022   BILITOT 0.8 08/28/2022   ALKPHOS 94 08/28/2022   AST 18 08/28/2022   ALT 8 08/28/2022   PROT 6.8 08/28/2022   ALBUMIN 4.2 08/28/2022   CALCIUM 9.6 11/21/2022   ANIONGAP 11 04/10/2018   EGFR 92 11/21/2022   Lab Results  Component Value Date   CHOL 173 02/15/2022   Lab Results  Component Value Date   HDL 57 02/15/2022   Lab Results  Component Value Date   LDLCALC 90 02/15/2022   Lab Results  Component Value Date   TRIG 148 02/15/2022   Lab Results  Component Value Date   CHOLHDL 3.0 02/15/2022   Lab Results  Component Value Date   HGBA1C 4.6 (L) 05/16/2022      Assessment & Plan:   Karyn was seen today for medical management of chronic issues.  Diagnoses and all orders for this visit:  Primary hypertension Well controlled on current regimen. Continue losartan and HCTZ. Labs pending.  -     hydrochlorothiazide (HYDRODIURIL) 25 MG tablet; Take 1 tablet (25 mg total) by mouth daily. -     CBC with Differential/Platelet -     TSH  Hypokalemia BMP pending. On supplement.  -     BMP8+EGFR  Generalized OA DDD (degenerative disc disease), lumbar Controlled substance agreement signed Uncontrolled. Will increase to 60 tablets a month for more frequent use prn. Continue  meloxicam, biofreeze, tylenol when not taking norco, and follow up with ortho. PDMP reviewed, no red flags.  -     HYDROcodone-acetaminophen (NORCO) 5-325 MG tablet; Take 1 tablet by mouth every 6 (six) hours as needed for moderate pain. -     HYDROcodone-acetaminophen (NORCO) 5-325 MG tablet; Take 1 tablet by mouth every 6 (six) hours as needed for moderate pain. -     HYDROcodone-acetaminophen (NORCO) 5-325 MG tablet; Take 1 tablet by mouth every 6 (six) hours as needed for moderate pain.  Mixed hyperlipidemia Fasting panel pending. Diet and exercise.  -     Lipid panel  Follow-up: Return in about 3 months (around 05/23/2023) for chronic follow up.   The  patient indicates understanding of these issues and agrees with the plan.  Gwenlyn Perking, FNP

## 2023-02-21 LAB — BMP8+EGFR
BUN/Creatinine Ratio: 9 — ABNORMAL LOW (ref 12–28)
BUN: 7 mg/dL — ABNORMAL LOW (ref 8–27)
CO2: 24 mmol/L (ref 20–29)
Calcium: 9.6 mg/dL (ref 8.7–10.3)
Chloride: 100 mmol/L (ref 96–106)
Creatinine, Ser: 0.74 mg/dL (ref 0.57–1.00)
Glucose: 91 mg/dL (ref 70–99)
Potassium: 3.8 mmol/L (ref 3.5–5.2)
Sodium: 140 mmol/L (ref 134–144)
eGFR: 89 mL/min/{1.73_m2} (ref >59)

## 2023-02-21 LAB — CBC WITH DIFFERENTIAL/PLATELET
Basophils Absolute: 0 10*3/uL (ref 0.0–0.2)
Basos: 1 %
EOS (ABSOLUTE): 0.1 10*3/uL (ref 0.0–0.4)
Eos: 2 %
Hematocrit: 39.9 % (ref 34.0–46.6)
Hemoglobin: 13.3 g/dL (ref 11.1–15.9)
Immature Grans (Abs): 0 10*3/uL (ref 0.0–0.1)
Immature Granulocytes: 0 %
Lymphocytes Absolute: 1.3 10*3/uL (ref 0.7–3.1)
Lymphs: 18 %
MCH: 27.3 pg (ref 26.6–33.0)
MCHC: 33.3 g/dL (ref 31.5–35.7)
MCV: 82 fL (ref 79–97)
Monocytes Absolute: 0.6 10*3/uL (ref 0.1–0.9)
Monocytes: 8 %
Neutrophils Absolute: 5.3 10*3/uL (ref 1.4–7.0)
Neutrophils: 71 %
Platelets: 260 10*3/uL (ref 150–450)
RBC: 4.88 x10E6/uL (ref 3.77–5.28)
RDW: 13.1 % (ref 11.7–15.4)
WBC: 7.4 10*3/uL (ref 3.4–10.8)

## 2023-02-21 LAB — LIPID PANEL
Chol/HDL Ratio: 2.6 ratio (ref 0.0–4.4)
Cholesterol, Total: 151 mg/dL (ref 100–199)
HDL: 57 mg/dL (ref >39)
LDL Chol Calc (NIH): 71 mg/dL (ref 0–99)
Triglycerides: 131 mg/dL (ref 0–149)
VLDL Cholesterol Cal: 23 mg/dL (ref 5–40)

## 2023-02-21 LAB — TSH: TSH: 1.22 u[IU]/mL (ref 0.450–4.500)

## 2023-05-13 ENCOUNTER — Other Ambulatory Visit: Payer: Self-pay | Admitting: Family Medicine

## 2023-05-13 DIAGNOSIS — I10 Essential (primary) hypertension: Secondary | ICD-10-CM

## 2023-05-14 ENCOUNTER — Other Ambulatory Visit: Payer: Self-pay | Admitting: Family Medicine

## 2023-05-14 DIAGNOSIS — M159 Polyosteoarthritis, unspecified: Secondary | ICD-10-CM

## 2023-05-23 ENCOUNTER — Ambulatory Visit (INDEPENDENT_AMBULATORY_CARE_PROVIDER_SITE_OTHER): Payer: 59 | Admitting: Family Medicine

## 2023-05-23 ENCOUNTER — Encounter: Payer: Self-pay | Admitting: Family Medicine

## 2023-05-23 VITALS — BP 118/73 | HR 84 | Temp 97.0°F | Ht 64.0 in | Wt 161.2 lb

## 2023-05-23 DIAGNOSIS — Z20822 Contact with and (suspected) exposure to covid-19: Secondary | ICD-10-CM

## 2023-05-23 NOTE — Progress Notes (Signed)
   Acute Office Visit  Subjective:     Patient ID: Kathy Barr, female    DOB: September 06, 1956, 67 y.o.   MRN: [id number]  Chief Complaint  Patient presents with   exposed to covid    No symptoms     HPI Patient is in today for exposure to Covid. She was around her daughter last weekend who tested positive for Covid yesterday. She reports that her daughter did no have any symptoms over the weekend and isn't sure when her symptoms did start. Kathy Barr denies any symptoms currently. She would like to be tested.   ROS As per HPI.      Objective:    BP 118/73   Pulse 84   Temp (!) 97 F (36.1 C) (Temporal)   Ht 5\' 4"  (1.626 m)   Wt 161 lb 3.2 oz (73.1 kg)   SpO2 97%   BMI 27.67 kg/m    Physical Exam Vitals and nursing note reviewed.  Constitutional:      General: She is not in acute distress.    Appearance: Normal appearance. She is not ill-appearing.  Cardiovascular:     Rate and Rhythm: Normal rate and regular rhythm.     Pulses: Normal pulses.     Heart sounds: Normal heart sounds. No murmur heard. Pulmonary:     Effort: Pulmonary effort is normal. No respiratory distress.     Breath sounds: Normal breath sounds.  Abdominal:     General: Bowel sounds are normal.     Palpations: Abdomen is soft.  Musculoskeletal:     Cervical back: Neck supple. No tenderness.     Right lower leg: No edema.     Left lower leg: No edema.  Lymphadenopathy:     Cervical: No cervical adenopathy.  Skin:    General: Skin is warm and dry.  Neurological:     General: No focal deficit present.     Mental Status: She is alert and oriented to person, place, and time.  Psychiatric:        Mood and Affect: Mood normal.        Behavior: Behavior normal.     No results found for any visits on 05/23/23.      Assessment & Plan:   Kathy Barr was seen today for exposed to covid.  Diagnoses and all orders for this visit:  Exposure to COVID-19 virus Covid test pending. Quarantine until results.  Will notify of results when available.  -     Novel Coronavirus, NAA ([facility])   Return to office for new or worsening symptoms, or if symptoms persist.   The patient indicates understanding of these issues and agrees with the plan.  Gabriel Earing, FNP

## 2023-05-26 ENCOUNTER — Other Ambulatory Visit: Payer: Self-pay | Admitting: Family Medicine

## 2023-05-26 ENCOUNTER — Telehealth: Payer: Self-pay | Admitting: Family Medicine

## 2023-05-26 DIAGNOSIS — Z1231 Encounter for screening mammogram for malignant neoplasm of breast: Secondary | ICD-10-CM

## 2023-05-26 LAB — NOVEL CORONAVIRUS, NAA: SARS-CoV-2, NAA: NOT DETECTED

## 2023-05-26 NOTE — Telephone Encounter (Signed)
Not back yet patient please let patient know when they call back

## 2023-05-26 NOTE — Telephone Encounter (Signed)
Patient calling to check on her COVID results. She said she was told to call today if she had not heard back. Please review and call.

## 2023-05-27 NOTE — Telephone Encounter (Signed)
Patient called back about COVID test results. Made her aware that they are not back and we will call her when they are back and reviewed

## 2023-05-30 ENCOUNTER — Telehealth: Payer: Self-pay | Admitting: Family Medicine

## 2023-06-02 ENCOUNTER — Encounter: Payer: Self-pay | Admitting: Family Medicine

## 2023-06-02 ENCOUNTER — Ambulatory Visit (INDEPENDENT_AMBULATORY_CARE_PROVIDER_SITE_OTHER): Payer: 59 | Admitting: Family Medicine

## 2023-06-02 VITALS — BP 129/79 | HR 88 | Temp 98.0°F | Ht 64.0 in | Wt 163.0 lb

## 2023-06-02 DIAGNOSIS — M159 Polyosteoarthritis, unspecified: Secondary | ICD-10-CM | POA: Diagnosis not present

## 2023-06-02 DIAGNOSIS — I10 Essential (primary) hypertension: Secondary | ICD-10-CM

## 2023-06-02 DIAGNOSIS — M5136 Other intervertebral disc degeneration, lumbar region: Secondary | ICD-10-CM

## 2023-06-02 DIAGNOSIS — Z79899 Other long term (current) drug therapy: Secondary | ICD-10-CM

## 2023-06-02 DIAGNOSIS — E876 Hypokalemia: Secondary | ICD-10-CM

## 2023-06-02 MED ORDER — HYDROCODONE-ACETAMINOPHEN 5-325 MG PO TABS: 1.0000 | ORAL_TABLET | Freq: Four times a day (QID) | ORAL | 0 refills | Status: DC | PRN

## 2023-06-02 NOTE — Progress Notes (Signed)
Established Patient Office Visit  Subjective:  Patient ID: Kathy Barr, female    DOB: 01/28/1956  Age: 67 y.o. MRN: [id number]  CC:  Chief Complaint  Patient presents with   Medical Management of Chronic Issues    HPI Kathy Barr presents for chronic follow up.  HTN Complaint with meds - Yes Current Medications - losartan and HCTZ  Checking BP at home ranging- not checking at home Exercising Regularly - stays active during the day caring for foster child Watching Salt intake - Yes Pertinent ROS:  Headache - No Fatigue - No Visual Disturbances - No Chest pain - No Dyspnea - No Palpitations - No LE edema - No   2. Generalized OA DDD Pain assessment: Cause of pain- generalized OA Pain location- all joints, mostly shoulder, hips, back, knees, hands Pain on scale of 1-10:  8 She takes her pain medication 2x a day some days and not all other days. She also takes meloxicam, biofeeze, and plain tylenol prn. She also uses topical pain ointments. She has tried PT multiple times. She has had numerous joint injections. She has started using a cane. She has not taken this in a week because she has been out. She will see ortho in a few days again.  Frequency- daily What increases pain- activity, bending, twisting What makes pain Better-rest, pain medicine improves pain to 6/10 Effects on ADL - difficulty doing housework,  Any change in general medical condition-no  Current opioids rx- Norco q 6 hours prn # meds rx- 60 tablets monthly Effectiveness of current meds- brings pain down to a 2/10 Adverse reactions from pain meds- denies Morphine equivalent- 40 mme per Rx Pill count performed-No Last drug screen - 02/15/22 ( high risk q72m, moderate risk q72m, low risk yearly ) Urine drug screen today- No Was the NCCSR reviewed- yes  If yes were their any concerning findings? - no  Overdose risk: 140    02/15/2022   11:22 AM  Opioid Risk   Alcohol 0  Illegal Drugs 0  Rx  Drugs 0  Alcohol 0  Illegal Drugs 0  Rx Drugs 0  Age between 16-45 years  0  History of Preadolescent Sexual Abuse 0  Psychological Disease 0  Depression 0  Opioid Risk Tool Scoring 0  Opioid Risk Interpretation Low Risk   Pain contract signed on: 02/15/22    Past Medical History:  Diagnosis Date   Arthritis    "all over" (03/18/2013)   Carpal tunnel syndrome    "right" (03/18/2013)   Fracture 2013   RLE   GERD (gastroesophageal reflux disease)    OTC   Hypertension    IBS (irritable bowel syndrome)    Osteoarthritis of left hip    Osteopenia 05/28/2021   Seizures (HCC) 1960's; 1978   "when I was little; when I was 7 months pregnant" (03/18/2013)   Shortness of breath    "not since I quit smoking" (03/18/2013)    Past Surgical History:  Procedure Laterality Date   ANTERIOR CERVICAL DECOMP/DISCECTOMY FUSION  ?2003   ANTERIOR CERVICAL DECOMP/DISCECTOMY FUSION N/A 04/24/2018   Procedure: C6-7 PLATE REMOVAL, G4-0, C5-6 ANTERIOR CERVICAL DECOMPRESSION/DISCECTOMY & FUSION, ALLOGRAFT, PLATE;  Surgeon: Eldred Manges, MD;  Location: [facility];  Service: Orthopedics;  Laterality: N/A;   CHOLECYSTECTOMY  2003   COLONOSCOPY N/A 02/17/2014   Procedure: COLONOSCOPY;  Surgeon: Malissa Hippo, MD;  [facility]: [facility];  Service: Endoscopy;  Laterality: N/A;  145-moved to 10:30  Ann to notify pt   COLONOSCOPY N/A 08/25/2019   Procedure: COLONOSCOPY;  Surgeon: Malissa Hippo, MD;  Location: [facility];  Service: Endoscopy;  Laterality: N/A;  1030   FOOT ARTHROTOMY Right 1990's   pin, after removing a bone    JOINT REPLACEMENT Left    hip and right   pinched nerve     POLYPECTOMY  08/25/2019   Procedure: POLYPECTOMY;  Surgeon: Malissa Hippo, MD;  Location: [facility];  Service: Endoscopy;;   TOTAL HIP ARTHROPLASTY  06/30/2012   Procedure: TOTAL HIP ARTHROPLASTY;  Surgeon: Kerrin Champagne, MD;  Location: [facility];  Service: Orthopedics;  Laterality: Left;  Left total hip replacement with  metal and polypropylene pore coated implants   TOTAL HIP ARTHROPLASTY Right 03/16/2013   Procedure: RIGHT TOTAL HIP ARTHROPLASTY- right ;  Surgeon: Kerrin Champagne, MD;  Location: [facility];  Service: Orthopedics;  Laterality: Right;   TUBAL LIGATION  1978   VAGINAL HYSTERECTOMY  ~ 04-Jul-2002    Family History  Problem Relation Age of Onset   Hypertension Father    Diabetes Father    Hypertension Sister    Diabetes Sister    Hypertension Brother    Diabetes Brother    Diabetes Mother    Hypertension Mother    Hyperlipidemia Mother    COPD Brother    Heart disease Brother    Hypertension Brother     Social History   Socioeconomic History   Marital status: Divorced    Spouse name: Not on file   Number of children: 1   Years of education: 9   Highest education level: 9th grade  Occupational History    Employer: DISABLED  Tobacco Use   Smoking status: Former    Packs/day: 0.25    Years: 25.00    Additional pack years: 0.00    Total pack years: 6.25    Types: Cigarettes   Smokeless tobacco: Never   Tobacco comments:    Light smoker - has tried to quit, but always starts back  Vaping Use   Vaping Use: Never used  Substance and Sexual Activity   Alcohol use: No    Alcohol/week: 0.0 standard drinks of alcohol   Drug use: No   Sexual activity: Yes  Other Topics Concern   Not on file  Social History Narrative   Her significant other of 15 years passed away in Jul 04, 2020   Jun 15, 2022 - She recently adopted a one year old. (From her ex-fiance's grandchild who's parents didn't take care of her well and she was taken by social services)   Daughter and mother live nearby   Social Determinants of Health   Financial Resource Strain: Low Risk  (06/15/2022)   Overall Financial Resource Strain (CARDIA)    Difficulty of Paying Living Expenses: Not very hard  Food Insecurity: No Food Insecurity (06/15/22)   Hunger Vital Sign    Worried About Running Out of Food in the Last Year: Never true     Ran Out of Food in the Last Year: Never true  Transportation Needs: No Transportation Needs (06/15/2022)   PRAPARE - Administrator, Civil Service (Medical): No    Lack of Transportation (Non-Medical): No  Physical Activity: Insufficiently Active (06/15/2022)   Exercise Vital Sign    Days of Exercise per Week: 7 days    Minutes of Exercise per Session: 20 min  Stress: No Stress Concern Present (2022-06-15)   [employer] of Occupational  Health - Occupational Stress Questionnaire    Feeling of Stress : Not at all  Social Connections: Socially Isolated (05/29/2022)   Social Connection and Isolation Panel [NHANES]    Frequency of Communication with Friends and Family: More than three times a week    Frequency of Social Gatherings with Friends and Family: More than three times a week    Attends Religious Services: Never    Database administrator or Organizations: No    Attends Banker Meetings: Never    Marital Status: Divorced  Catering manager Violence: Not At Risk (05/29/2022)   Humiliation, Afraid, Rape, and Kick questionnaire    Fear of Current or Ex-Partner: No    Emotionally Abused: No    Physically Abused: No    Sexually Abused: No    Outpatient Medications Prior to Visit  Medication Sig Dispense Refill   albuterol (VENTOLIN HFA) 108 (90 Base) MCG/ACT inhaler INHALE 2 PUFFS INTO LUNGS EVERY 6 HOURS AS NEEDED FOR WHEEZING OR SHORTNESS OF BREATH 18 g 2   Calcium Carb-Cholecalciferol (CALCIUM 600+D3 PO) Take 1 tablet by mouth 2 (two) times daily.     hydrochlorothiazide (HYDRODIURIL) 25 MG tablet Take 1 tablet (25 mg total) by mouth daily. 90 tablet 3   loratadine (CLARITIN) 10 MG tablet Take 10 mg by mouth daily.     losartan (COZAAR) 50 MG tablet Take 1 tablet by mouth once daily 90 tablet 0   meloxicam (MOBIC) 7.5 MG tablet Take 1 tablet by mouth once daily 90 tablet 0   naloxone (NARCAN) nasal spray 4 mg/0.1 mL Nasally for overdose 1 each 3    potassium chloride (KLOR-CON M) 10 MEQ tablet Take 1 tablet (10 mEq total) by mouth daily. 90 tablet 1   No facility-administered medications prior to visit.    No Known Allergies  ROS Review of Systems As per HPI.    Objective:    Physical Exam Vitals and nursing note reviewed.  Constitutional:      General: She is not in acute distress.    Appearance: Normal appearance. She is not ill-appearing, toxic-appearing or diaphoretic.  HENT:     Head: Normocephalic and atraumatic.     Mouth/Throat:     Mouth: Mucous membranes are moist.     Pharynx: Oropharynx is clear.  Eyes:     Extraocular Movements: Extraocular movements intact.     Conjunctiva/sclera: Conjunctivae normal.     Pupils: Pupils are equal, round, and reactive to light.  Cardiovascular:     Rate and Rhythm: Normal rate and regular rhythm.     Pulses: Normal pulses.     Heart sounds: Normal heart sounds. No murmur heard. Pulmonary:     Effort: Pulmonary effort is normal. No respiratory distress.     Breath sounds: Normal breath sounds.  Musculoskeletal:        General: No swelling.     Lumbar back: Tenderness present. No swelling, edema, deformity, signs of trauma or bony tenderness.     Right lower leg: No edema.     Left lower leg: No edema.  Skin:    General: Skin is warm and dry.  Neurological:     General: No focal deficit present.     Mental Status: She is alert and oriented to person, place, and time.  Psychiatric:        Mood and Affect: Mood normal.        Behavior: Behavior normal.  Thought Content: Thought content normal.        Judgment: Judgment normal.     BP 129/79   Pulse 88   Temp 98 F (36.7 C)   Ht 5\' 4"  (1.626 m)   Wt 163 lb (73.9 kg)   SpO2 97%   BMI 27.98 kg/m  Wt Readings from Last 3 Encounters:  06/02/23 163 lb (73.9 kg)  05/23/23 161 lb 3.2 oz (73.1 kg)  02/20/23 160 lb (72.6 kg)     Health Maintenance Due  Topic Date Due   Medicare Annual Wellness (AWV)   05/30/2023      There are no preventive care reminders to display for this patient.  Lab Results  Component Value Date   TSH 1.220 02/20/2023   Lab Results  Component Value Date   WBC 7.4 02/20/2023   HGB 13.3 02/20/2023   HCT 39.9 02/20/2023   MCV 82 02/20/2023   PLT 260 02/20/2023   Lab Results  Component Value Date   NA 140 02/20/2023   K 3.8 02/20/2023   CO2 24 02/20/2023   GLUCOSE 91 02/20/2023   BUN 7 (L) 02/20/2023   CREATININE 0.74 02/20/2023   BILITOT 0.8 08/28/2022   ALKPHOS 94 08/28/2022   AST 18 08/28/2022   ALT 8 08/28/2022   PROT 6.8 08/28/2022   ALBUMIN 4.2 08/28/2022   CALCIUM 9.6 02/20/2023   ANIONGAP 11 04/10/2018   EGFR 89 02/20/2023   Lab Results  Component Value Date   CHOL 151 02/20/2023   Lab Results  Component Value Date   HDL 57 02/20/2023   Lab Results  Component Value Date   LDLCALC 71 02/20/2023   Lab Results  Component Value Date   TRIG 131 02/20/2023   Lab Results  Component Value Date   CHOLHDL 2.6 02/20/2023   Lab Results  Component Value Date   HGBA1C 4.6 (L) 05/16/2022      Assessment & Plan:   Rashon was seen today for medical management of chronic issues.  Diagnoses and all orders for this visit:  Primary hypertension Well controlled on current regimen.   Hypokalemia On supplement. BMP pending.  -     BMP8+EGFR  Generalized OA DDD (degenerative disc disease), lumbar Controlled substance agreement signed Stable. Has appt with ortho in 2 days. She has been out of medication for 1 week. CSA updated today. Will do UDS at next visit since it has been a week since she has had pain medicine. PDMP reviewed, no red flags.  -     HYDROcodone-acetaminophen (NORCO/VICODIN) 5-325 MG tablet; Take 1 tablet by mouth every 6 (six) hours as needed for moderate pain. -     HYDROcodone-acetaminophen (NORCO/VICODIN) 5-325 MG tablet; Take 1 tablet by mouth every 6 (six) hours as needed for moderate pain. -      HYDROcodone-acetaminophen (NORCO/VICODIN) 5-325 MG tablet; Take 1 tablet by mouth every 6 (six) hours as needed for moderate pain.  Follow-up: Return in about 3 months (around 09/02/2023) for chronic follow up- will need toxassure.   The patient indicates understanding of these issues and agrees with the plan.  Gabriel Earing, FNP

## 2023-06-04 ENCOUNTER — Ambulatory Visit (INDEPENDENT_AMBULATORY_CARE_PROVIDER_SITE_OTHER): Payer: 59 | Admitting: Orthopaedic Surgery

## 2023-06-04 ENCOUNTER — Encounter: Payer: Self-pay | Admitting: Orthopaedic Surgery

## 2023-06-04 VITALS — Ht 64.0 in | Wt 163.0 lb

## 2023-06-04 DIAGNOSIS — R202 Paresthesia of skin: Secondary | ICD-10-CM

## 2023-06-04 DIAGNOSIS — R200 Anesthesia of skin: Secondary | ICD-10-CM

## 2023-06-04 NOTE — Progress Notes (Signed)
Office Visit Note   Patient: Kathy Barr           Date of Birth: [birth year]           MRN: [id number] Visit Date: 06/04/2023              Requested by: Gabriel Earing, FNP [address] [place],  [place] [address] PCP: Gabriel Earing, FNP   Assessment & Plan: Visit Diagnoses:  1. Numbness and tingling in left hand     Plan: I will place her in a splint she can use at night.  If she has persistent symptoms she can return she has been taking care of her grandchildren doing a lot of lifting grabbing squeezing note her symptoms are consistent with carpal tunnel syndrome.  She has persistent problems she will call and we can obtain electrical test.  Follow-Up Instructions: No follow-ups on file.   Orders:  No orders of the defined types were placed in this encounter.  No orders of the defined types were placed in this encounter.     Procedures: No procedures performed   Clinical Data: No additional findings.   Subjective: Chief Complaint  Patient presents with   Left Hand - Numbness    HPI 67 year old female with left hand numbness and tingling she states the pain will wake her up at night she drops objects.  When she wakes up shakes her hand after a while she is able to fall asleep.  She states her neck is doing well she has no problems with the right hand does not.  She is taking care of the grandchild that she continues to have to pick up repetitively.  Previous cervical fusion, total hip arthroplasties doing well.  Review of Systems all systems noncontributory to HPI.   Objective: Vital Signs: Ht 5\' 4"  (1.626 m)   Wt 163 lb (73.9 kg)   BMI 27.98 kg/m   Physical Exam Constitutional:      Appearance: She is well-developed.  HENT:     Head: Normocephalic.     Right Ear: External ear normal.     Left Ear: External ear normal. There is no impacted cerumen.  Eyes:     Pupils: Pupils are equal, round, and reactive to light.  Neck:     Thyroid: No  thyromegaly.     Trachea: No tracheal deviation.  Cardiovascular:     Rate and Rhythm: Normal rate.  Pulmonary:     Effort: Pulmonary effort is normal.  Abdominal:     Palpations: Abdomen is soft.  Musculoskeletal:     Cervical back: No rigidity.  Skin:    General: Skin is warm and dry.  Neurological:     Mental Status: She is alert and oriented to person, place, and time.  Psychiatric:        Behavior: Behavior normal.     Ortho Exam healed cervical fusions.  Positive Phalen's left positive median compression test.  No thenar atrophy.  Negative Spurling.  Opposite right hand negative Phalen's negative Tinel's.  No interosseous weakness right or left.  She does have some stiffness and lacks about 1 cm touching fingertip to distal palmar crease which passively can be corrected with some joint discomfort.  Specialty Comments:  No specialty comments available.  Imaging: No results found.   PMFS History: Patient Active Problem List   Diagnosis Date Noted   Numbness and tingling in left hand 06/05/2023   Right groin pain 02/07/2023  Hypokalemia 11/21/2022   Chondromalacia patellae, right knee 05/23/2022   Mild intermittent asthma without complication 05/16/2022   Osteopenia 05/28/2021   Controlled substance agreement signed 02/23/2021   Hx of colonic polyps 02/15/2019   Fusion of spine of cervical region 04/24/2018   Other spondylosis with radiculopathy, cervical region 03/05/2018   Hypertriglyceridemia 08/27/2017   Vertigo 02/04/2017   Atrophic vulvovaginitis 06/05/2015   DDD (degenerative disc disease), lumbar 01/13/2015   IBS (irritable bowel syndrome) 07/12/2014   Osteoarthritis of right hip 03/16/2013    Class: Chronic   Generalized OA 10/29/2012   Primary hypertension 10/29/2012   Past Medical History:  Diagnosis Date   Arthritis    "all over" (03/18/2013)   Carpal tunnel syndrome    "right" (03/18/2013)   Fracture 2013   RLE   GERD (gastroesophageal reflux  disease)    OTC   Hypertension    IBS (irritable bowel syndrome)    Osteoarthritis of left hip    Osteopenia 05/28/2021   Seizures (HCC) 1960's; 1978   "when I was little; when I was 7 months pregnant" (03/18/2013)   Shortness of breath    "not since I quit smoking" (03/18/2013)    Family History  Problem Relation Age of Onset   Hypertension Father    Diabetes Father    Hypertension Sister    Diabetes Sister    Hypertension Brother    Diabetes Brother    Diabetes Mother    Hypertension Mother    Hyperlipidemia Mother    COPD Brother    Heart disease Brother    Hypertension Brother     Past Surgical History:  Procedure Laterality Date   ANTERIOR CERVICAL DECOMP/DISCECTOMY FUSION  ?2003   ANTERIOR CERVICAL DECOMP/DISCECTOMY FUSION N/A 04/24/2018   Procedure: C6-7 PLATE REMOVAL, I6-9, C5-6 ANTERIOR CERVICAL DECOMPRESSION/DISCECTOMY & FUSION, ALLOGRAFT, PLATE;  Surgeon: Eldred Manges, MD;  Location: [facility];  Service: Orthopedics;  Laterality: N/A;   CHOLECYSTECTOMY  2003   COLONOSCOPY N/A 02/17/2014   Procedure: COLONOSCOPY;  Surgeon: Malissa Hippo, MD;  [facility];  Service: Endoscopy;  Laterality: N/A;  145-moved to 10:30 Ann to notify pt   COLONOSCOPY N/A 08/25/2019   Procedure: COLONOSCOPY;  Surgeon: Malissa Hippo, MD;  [facility];  Service: Endoscopy;  Laterality: N/A;  1030   FOOT ARTHROTOMY Right 1990's   pin, after removing a bone    JOINT REPLACEMENT Left    hip and right   pinched nerve     POLYPECTOMY  08/25/2019   Procedure: POLYPECTOMY;  Surgeon: Malissa Hippo, MD;  [facility];  Service: Endoscopy;;   TOTAL HIP ARTHROPLASTY  06/30/2012   Procedure: TOTAL HIP ARTHROPLASTY;  Surgeon: Kerrin Champagne, MD;  Location: [facility];  Service: Orthopedics;  Laterality: Left;  Left total hip replacement with metal and polypropylene pore coated implants   TOTAL HIP ARTHROPLASTY Right 03/16/2013   Procedure: RIGHT TOTAL HIP ARTHROPLASTY- right  ;  Surgeon: Kerrin Champagne, MD;  Location: [facility];  Service: Orthopedics;  Laterality: Right;   TUBAL LIGATION  1978   VAGINAL HYSTERECTOMY  ~ 2003   Social History   Occupational History    Employer: DISABLED  Tobacco Use   Smoking status: Former    Packs/day: 0.25    Years: 25.00    Additional pack years: 0.00    Total pack years: 6.25    Types: Cigarettes   Smokeless tobacco: Never   Tobacco comments:  Light smoker - has tried to quit, but always starts back  Vaping Use   Vaping Use: Never used  Substance and Sexual Activity   Alcohol use: No    Alcohol/week: 0.0 standard drinks of alcohol   Drug use: No   Sexual activity: Yes

## 2023-06-05 DIAGNOSIS — R200 Anesthesia of skin: Secondary | ICD-10-CM | POA: Insufficient documentation

## 2023-07-14 ENCOUNTER — Ambulatory Visit
Admission: RE | Admit: 2023-07-14 | Discharge: 2023-07-14 | Disposition: A | Discharge status: Home / Self Care | Payer: 59 | Source: Ambulatory Visit | Attending: Family Medicine | Admitting: Family Medicine

## 2023-07-14 DIAGNOSIS — Z1231 Encounter for screening mammogram for malignant neoplasm of breast: Secondary | ICD-10-CM | POA: Diagnosis not present

## 2023-07-21 ENCOUNTER — Other Ambulatory Visit: Payer: Self-pay

## 2023-07-21 DIAGNOSIS — Z780 Asymptomatic menopausal state: Secondary | ICD-10-CM

## 2023-07-25 ENCOUNTER — Encounter: Payer: Self-pay | Admitting: Family Medicine

## 2023-08-01 ENCOUNTER — Ambulatory Visit: Payer: 59

## 2023-08-01 VITALS — Ht 64.0 in | Wt 165.0 lb

## 2023-08-01 DIAGNOSIS — Z0000 Encounter for general adult medical examination without abnormal findings: Secondary | ICD-10-CM | POA: Diagnosis not present

## 2023-08-01 NOTE — Progress Notes (Signed)
Subjective:   Kathy Barr is a 67 y.o. female who presents for Medicare Annual (Subsequent) preventive examination.  Visit Complete: Virtual  I connected with  Lebron Quam on 08/01/23 by a audio enabled telemedicine application and verified that I am speaking with the correct person using two identifiers.  Patient Location: Home  Provider Location: Home Office  I discussed the limitations of evaluation and management by telemedicine. The patient expressed understanding and agreed to proceed.  Patient Medicare AWV questionnaire was completed by the patient on 08/01/2023; I have confirmed that all information answered by patient is correct and no changes since this date.  Review of Systems    Vital Signs: Unable to obtain new vitals due to this being a telehealth visit.  Cardiac Risk Factors include: advanced age (>23men, >55 women);dyslipidemia;hypertension     Objective:    Today's Vitals   08/01/23 0830  Weight: 165 lb (74.8 kg)  Height: 5\' 4"  (1.626 m)   Body mass index is 28.32 kg/m.     08/01/2023    8:34 AM 05/29/2022    3:44 PM 05/28/2021    3:52 PM 08/25/2019    7:44 AM 12/28/2018    9:39 AM 05/02/2018   10:50 PM 04/24/2018    1:55 PM  Advanced Directives  Does Patient Have a Medical Advance Directive? Yes Yes No No No No No  Type of Estate agent of [place];Living will Healthcare Power of [place];Living will       Copy of Healthcare Power of Attorney in Chart? No - copy requested No - copy requested       Would patient like information on creating a medical advance directive?   No - Patient declined No - Patient declined No - Patient declined  No - Patient declined    Current Medications (verified) Outpatient Encounter Medications as of 08/01/2023  Medication Sig   albuterol (VENTOLIN HFA) 108 (90 Base) MCG/ACT inhaler INHALE 2 PUFFS INTO LUNGS EVERY 6 HOURS AS NEEDED FOR WHEEZING OR SHORTNESS OF BREATH   Calcium Carb-Cholecalciferol  (CALCIUM 600+D3 PO) Take 1 tablet by mouth 2 (two) times daily.   hydrochlorothiazide (HYDRODIURIL) 25 MG tablet Take 1 tablet (25 mg total) by mouth daily.   HYDROcodone-acetaminophen (NORCO/VICODIN) 5-325 MG tablet Take 1 tablet by mouth every 6 (six) hours as needed for moderate pain.   HYDROcodone-acetaminophen (NORCO/VICODIN) 5-325 MG tablet Take 1 tablet by mouth every 6 (six) hours as needed for moderate pain.   loratadine (CLARITIN) 10 MG tablet Take 10 mg by mouth daily.   losartan (COZAAR) 50 MG tablet Take 1 tablet by mouth once daily   meloxicam (MOBIC) 7.5 MG tablet Take 1 tablet by mouth once daily   naloxone (NARCAN) nasal spray 4 mg/0.1 mL Nasally for overdose   potassium chloride (KLOR-CON M) 10 MEQ tablet Take 1 tablet (10 mEq total) by mouth daily.   No facility-administered encounter medications on file as of 08/01/2023.    Allergies (verified) Patient has no known allergies.   History: Past Medical History:  Diagnosis Date   Arthritis    "all over" (03/18/2013)   Carpal tunnel syndrome    "right" (03/18/2013)   Fracture 2013   RLE   GERD (gastroesophageal reflux disease)    OTC   Hypertension    IBS (irritable bowel syndrome)    Osteoarthritis of left hip    Osteopenia 05/28/2021   Seizures (HCC) 1960's; 1978   "when I was little; when I was  7 months pregnant" (03/18/2013)   Shortness of breath    "not since I quit smoking" (03/18/2013)   Past Surgical History:  Procedure Laterality Date   ANTERIOR CERVICAL DECOMP/DISCECTOMY FUSION  ?10-Aug-2002   ANTERIOR CERVICAL DECOMP/DISCECTOMY FUSION N/A 04/24/2018   Procedure: C6-7 PLATE REMOVAL, Z6-1, C5-6 ANTERIOR CERVICAL DECOMPRESSION/DISCECTOMY & FUSION, ALLOGRAFT, PLATE;  Surgeon: Eldred Manges, MD;  Location: [facility] OR;  Service: Orthopedics;  Laterality: N/A;   CHOLECYSTECTOMY  08/10/2002   COLONOSCOPY N/A 02/17/2014   Procedure: COLONOSCOPY;  Surgeon: Malissa Hippo, MD;  Location: [facility];  Service: Endoscopy;   Laterality: N/A;  145-moved to 10:30 Ann to notify pt   COLONOSCOPY N/A 08/25/2019   Procedure: COLONOSCOPY;  Surgeon: Malissa Hippo, MD;  Location: [facility];  Service: Endoscopy;  Laterality: N/A;  1030   FOOT ARTHROTOMY Right 1990's   pin, after removing a bone    JOINT REPLACEMENT Left    hip and right   pinched nerve     POLYPECTOMY  08/25/2019   Procedure: POLYPECTOMY;  Surgeon: Malissa Hippo, MD;  Location: [facility];  Service: Endoscopy;;   TOTAL HIP ARTHROPLASTY  06/30/2012   Procedure: TOTAL HIP ARTHROPLASTY;  Surgeon: Kerrin Champagne, MD;  Location: [facility] OR;  Service: Orthopedics;  Laterality: Left;  Left total hip replacement with metal and polypropylene pore coated implants   TOTAL HIP ARTHROPLASTY Right 03/16/2013   Procedure: RIGHT TOTAL HIP ARTHROPLASTY- right ;  Surgeon: Kerrin Champagne, MD;  Location: [facility] OR;  Service: Orthopedics;  Laterality: Right;   TUBAL LIGATION  1978   VAGINAL HYSTERECTOMY  ~ 08/10/2002   Family History  Problem Relation Age of Onset   Hypertension Father    Diabetes Father    Hypertension Sister    Diabetes Sister    Hypertension Brother    Diabetes Brother    Diabetes Mother    Hypertension Mother    Hyperlipidemia Mother    COPD Brother    Heart disease Brother    Hypertension Brother    Social History   Socioeconomic History   Marital status: Divorced    Spouse name: Not on file   Number of children: 1   Years of education: 9   Highest education level: 9th grade  Occupational History    Employer: DISABLED  Tobacco Use   Smoking status: Former    Current packs/day: 0.25    Average packs/day: 0.3 packs/day for 25.0 years (6.3 ttl pk-yrs)    Types: Cigarettes   Smokeless tobacco: Never   Tobacco comments:    Light smoker - has tried to quit, but always starts back  Vaping Use   Vaping status: Never Used  Substance and Sexual Activity   Alcohol use: No    Alcohol/week: 0.0 standard drinks of alcohol   Drug use: No   Sexual  activity: Yes  Other Topics Concern   Not on file  Social History Narrative   Her significant other of 15 years passed away in August 10, 2020   2022/05/30 - She recently adopted a one year old. (From her ex-fiance's grandchild who's parents didn't take care of her well and she was taken by social services)   Daughter and mother live nearby   Social Determinants of Health   Financial Resource Strain: Low Risk  (08/01/2023)   Overall Financial Resource Strain (CARDIA)    Difficulty of Paying Living Expenses: Not hard at all  Food Insecurity: No Food Insecurity (08/01/2023)  Hunger Vital Sign    Worried About Running Out of Food in the Last Year: Never true    Ran Out of Food in the Last Year: Never true  Transportation Needs: No Transportation Needs (08/01/2023)   PRAPARE - Administrator, Civil Service (Medical): No    Lack of Transportation (Non-Medical): No  Physical Activity: Insufficiently Active (08/01/2023)   Exercise Vital Sign    Days of Exercise per Week: 3 days    Minutes of Exercise per Session: 30 min  Stress: No Stress Concern Present (08/01/2023)   [employer] of Occupational Health - Occupational Stress Questionnaire    Feeling of Stress : Not at all  Social Connections: Socially Isolated (08/01/2023)   Social Connection and Isolation Panel [NHANES]    Frequency of Communication with Friends and Family: More than three times a week    Frequency of Social Gatherings with Friends and Family: Once a week    Attends Religious Services: Never    Database administrator or Organizations: No    Attends Engineer, structural: Never    Marital Status: Divorced    Tobacco Counseling Counseling given: Not Answered Tobacco comments: Light smoker - has tried to quit, but always starts back   Clinical Intake:  Pre-visit preparation completed: Yes  Pain : No/denies pain     Nutritional Risks: None Diabetes: No  How often do you need to have someone help  you when you read instructions, pamphlets, or other written materials from your doctor or pharmacy?: 1 - Never  Interpreter Needed?: No  Information entered by :: Renie Ora, LPN   Activities of Daily Living    08/01/2023    8:35 AM  In your present state of health, do you have any difficulty performing the following activities:  Hearing? 0  Vision? 0  Difficulty concentrating or making decisions? 0  Walking or climbing stairs? 0  Dressing or bathing? 0  Doing errands, shopping? 0  Preparing Food and eating ? N  Using the Toilet? N  In the past six months, have you accidently leaked urine? N  Do you have problems with loss of bowel control? N  Managing your Medications? N  Managing your Finances? N  Housekeeping or managing your Housekeeping? N    Patient Care Team: Gabriel Earing, FNP as PCP - General (Family Medicine) Erroll Luna, [facility] (Inactive) as Pharmacist (Pharmacist) Delora Fuel, OD (Optometry)  Indicate any recent Medical Services you may have received from other than [facility] providers in the past year (date may be approximate).     Assessment:   This is a routine wellness examination for [place].  Hearing/Vision screen Vision Screening - Comments:: Wears rx glasses - up to date with routine eye exams with  Dr.Johnson   Dietary issues and exercise activities discussed:     Goals Addressed             This Visit's Progress    DIET - INCREASE WATER INTAKE         Depression Screen    08/01/2023    8:33 AM 06/02/2023   10:23 AM 02/20/2023    9:42 AM 11/21/2022   10:42 AM 09/18/2022   10:17 AM 08/28/2022   10:10 AM 05/29/2022    3:42 PM  PHQ 2/9 Scores  PHQ - 2 Score 0 0 0 1 0 0 0  PHQ- 9 Score 0 0 0 3 0  0    Fall Risk  08/01/2023    8:32 AM 06/02/2023   10:23 AM 02/20/2023    9:42 AM 11/21/2022   10:42 AM 09/18/2022   10:17 AM  Fall Risk   Falls in the past year? 0  0 0 0  Number falls in past yr: 0 0 0    Injury with Fall? 0 0  0    Risk for fall due to : No Fall Risks No Fall Risks No Fall Risks    Follow up Falls prevention discussed Falls evaluation completed Education provided      MEDICARE RISK AT HOME: Medicare Risk at Home Any stairs in or around the home?: Yes If so, are there any without handrails?: No Home free of loose throw rugs in walkways, pet beds, electrical cords, etc?: Yes Adequate lighting in your home to reduce risk of falls?: Yes Life alert?: No Use of a cane, walker or w/c?: No Grab bars in the bathroom?: Yes Shower chair or bench in shower?: Yes Elevated toilet seat or a handicapped toilet?: Yes  TIMED UP AND GO:  Was the test performed?  No    Cognitive Function:        08/01/2023    8:35 AM 05/29/2022    3:46 PM 05/28/2021    3:43 PM  6CIT Screen  What Year? 0 points 0 points 0 points  What month? 0 points 0 points 0 points  What time? 0 points 0 points 0 points  Count back from 20 0 points 0 points 0 points  Months in reverse 0 points 0 points 0 points  Repeat phrase 0 points 4 points 0 points  Total Score 0 points 4 points 0 points    Immunizations Immunization History  Administered Date(s) Administered   Fluad Quad(high Dose 65+) 08/28/2022   Influenza, High Dose Seasonal PF 09/30/2020, 09/22/2021   Influenza, Quadrivalent, Recombinant, Inj, Pf 09/12/2018   Influenza,inj,Quad PF,6+ Mos 08/27/2017, 09/12/2018, 08/23/2019   Influenza-Unspecified 10/17/2015, 09/05/2016, 08/23/2019   Moderna Sars-Covid-2 Vaccination 02/21/2020, 03/20/2020   PNEUMOCOCCAL CONJUGATE-20 08/17/2021   Pneumococcal Polysaccharide-23 03/18/2013, 06/19/2020   Td 05/03/1998, 05/30/2021   Tdap 03/21/2011   Zoster Recombinant(Shingrix) 09/12/2018, 01/13/2019    TDAP status: Up to date  Flu Vaccine status: Up to date  Pneumococcal vaccine status: Up to date  Covid-19 vaccine status: Completed vaccines  Qualifies for Shingles Vaccine? Yes   Zostavax completed Yes   Shingrix Completed?:  Yes  Screening Tests Health Maintenance  Topic Date Due   INFLUENZA VACCINE  07/10/2023   COVID-19 Vaccine (3 - Moderna risk series) 10/05/2023 (Originally 04/17/2020)   Medicare Annual Wellness (AWV)  07/31/2024   MAMMOGRAM  07/13/2025   Colonoscopy  08/24/2026   DTaP/Tdap/Td (4 - Td or Tdap) 05/31/2031   Pneumonia Vaccine 58+ Years old  Completed   DEXA SCAN  Completed   Hepatitis C Screening  Completed   Zoster Vaccines- Shingrix  Completed   HPV VACCINES  Aged Out    Health Maintenance  Health Maintenance Due  Topic Date Due   INFLUENZA VACCINE  07/10/2023    Colorectal cancer screening: Type of screening: Colonoscopy. Completed 08/25/2019. Repeat every 7 years  Mammogram status: Completed 07/14/2023. Repeat every year  Bone Density status: Ordered scheduled 09/03/2023. Pt provided with contact info and advised to call to schedule appt.  Lung Cancer Screening: (Low Dose CT Chest recommended if Age 28-80 years, 20 pack-year currently smoking OR have quit w/in 15years.) does not qualify.   Lung Cancer Screening Referral: n/a  Additional Screening:  Hepatitis C Screening: does not qualify; Completed 02/04/2017  Vision Screening: Recommended annual ophthalmology exams for early detection of glaucoma and other disorders of the eye. Is the patient up to date with their annual eye exam?  Yes  Who is the provider or what is the name of the office in which the patient attends annual eye exams? Dr.Johnson  If pt is not established with a provider, would they like to be referred to a provider to establish care? No .   Dental Screening: Recommended annual dental exams for proper oral hygiene   Community Resource Referral / Chronic Care Management: CRR required this visit?  No   CCM required this visit?  No     Plan:     I have personally reviewed and noted the following in the patient's chart:   Medical and social history Use of alcohol, tobacco or illicit drugs   Current medications and supplements including opioid prescriptions. Patient is not currently taking opioid prescriptions. Functional ability and status Nutritional status Physical activity Advanced directives List of other physicians Hospitalizations, surgeries, and ER visits in previous 12 months Vitals Screenings to include cognitive, depression, and falls Referrals and appointments  In addition, I have reviewed and discussed with patient certain preventive protocols, quality metrics, and best practice recommendations. A written personalized care plan for preventive services as well as general preventive health recommendations were provided to patient.     Lorrene Reid, LPN   08/06/5620   After Visit Summary: (MyChart) Due to this being a telephonic visit, the after visit summary with patients personalized plan was offered to patient via MyChart   Nurse Notes: none

## 2023-08-01 NOTE — Patient Instructions (Signed)
Kathy Barr , Thank you for taking time to come for your Medicare Wellness Visit. I appreciate your ongoing commitment to your health goals. Please review the following plan we discussed and let me know if I can assist you in the future.   Referrals/Orders/Follow-Ups/Clinician Recommendations: Aim for 30 minutes of exercise or brisk walking, 6-8 glasses of water, and 5 servings of fruits and vegetables each day.   This is a list of the screening recommended for you and due dates:  Health Maintenance  Topic Date Due   Flu Shot  07/10/2023   COVID-19 Vaccine (3 - Moderna risk series) 10/05/2023*   Medicare Annual Wellness Visit  07/31/2024   Mammogram  07/13/2025   Colon Cancer Screening  08/24/2026   DTaP/Tdap/Td vaccine (4 - Td or Tdap) 05/31/2031   Pneumonia Vaccine  Completed   DEXA scan (bone density measurement)  Completed   Hepatitis C Screening  Completed   Zoster (Shingles) Vaccine  Completed   HPV Vaccine  Aged Out  *Topic was postponed. The date shown is not the original due date.    Advanced directives: (Copy Requested) Please bring a copy of your health care power of attorney and living will to the office to be added to your chart at your convenience.  Next Medicare Annual Wellness Visit scheduled for next year: Yes  Insert Preventive Care attachment Insert FALL PREVENTION attachment if needed

## 2023-08-17 ENCOUNTER — Other Ambulatory Visit: Payer: Self-pay | Admitting: Family Medicine

## 2023-08-17 DIAGNOSIS — M159 Polyosteoarthritis, unspecified: Secondary | ICD-10-CM

## 2023-08-17 DIAGNOSIS — I10 Essential (primary) hypertension: Secondary | ICD-10-CM

## 2023-08-17 DIAGNOSIS — E876 Hypokalemia: Secondary | ICD-10-CM

## 2023-09-03 ENCOUNTER — Encounter: Payer: Self-pay | Admitting: Family Medicine

## 2023-09-03 ENCOUNTER — Ambulatory Visit (INDEPENDENT_AMBULATORY_CARE_PROVIDER_SITE_OTHER): Payer: 59 | Admitting: Family Medicine

## 2023-09-03 ENCOUNTER — Ambulatory Visit (INDEPENDENT_AMBULATORY_CARE_PROVIDER_SITE_OTHER): Payer: 59

## 2023-09-03 VITALS — BP 111/68 | HR 82 | Temp 97.0°F | Ht 64.0 in | Wt 165.2 lb

## 2023-09-03 DIAGNOSIS — Z23 Encounter for immunization: Secondary | ICD-10-CM

## 2023-09-03 DIAGNOSIS — R739 Hyperglycemia, unspecified: Secondary | ICD-10-CM

## 2023-09-03 DIAGNOSIS — Z79899 Other long term (current) drug therapy: Secondary | ICD-10-CM

## 2023-09-03 DIAGNOSIS — M5136 Other intervertebral disc degeneration, lumbar region: Secondary | ICD-10-CM

## 2023-09-03 DIAGNOSIS — I10 Essential (primary) hypertension: Secondary | ICD-10-CM | POA: Diagnosis not present

## 2023-09-03 DIAGNOSIS — E876 Hypokalemia: Secondary | ICD-10-CM | POA: Diagnosis not present

## 2023-09-03 DIAGNOSIS — E782 Mixed hyperlipidemia: Secondary | ICD-10-CM

## 2023-09-03 DIAGNOSIS — M159 Polyosteoarthritis, unspecified: Secondary | ICD-10-CM

## 2023-09-03 DIAGNOSIS — Z780 Asymptomatic menopausal state: Secondary | ICD-10-CM | POA: Diagnosis not present

## 2023-09-03 LAB — BMP8+EGFR
BUN/Creatinine Ratio: 11 — ABNORMAL LOW (ref 12–28)
BUN: 8 mg/dL (ref 8–27)
CO2: 24 mmol/L (ref 20–29)
Calcium: 9.6 mg/dL (ref 8.7–10.3)
Chloride: 101 mmol/L (ref 96–106)
Creatinine, Ser: 0.72 mg/dL (ref 0.57–1.00)
Glucose: 83 mg/dL (ref 70–99)
Potassium: 4.1 mmol/L (ref 3.5–5.2)
Sodium: 143 mmol/L (ref 134–144)
eGFR: 91 mL/min/{1.73_m2} (ref >59)

## 2023-09-03 LAB — BAYER DCA HB A1C WAIVED: HB A1C (BAYER DCA - WAIVED): 4.8 % (ref 4.8–5.6)

## 2023-09-03 MED ORDER — HYDROCODONE-ACETAMINOPHEN 5-325 MG PO TABS: 1.0000 | ORAL_TABLET | Freq: Four times a day (QID) | ORAL | 0 refills | Status: DC | PRN

## 2023-09-03 NOTE — Progress Notes (Signed)
Established Patient Office Visit  Subjective:  Patient ID: Kathy Barr, female    DOB: 11/04/1956  Age: 66 y.o. MRN: [id number]  CC:  Chief Complaint  Patient presents with   Medical Management of Chronic Issues   Hypertension    HPI Kathy Barr presents for chronic follow up.  HTN Complaint with meds - Yes Current Medications - losartan and HCTZ  Checking BP at home ranging- not checking at home Exercising Regularly - stays active during the day caring for foster child and acting as caregiver for her mother Watching Salt intake - Yes Pertinent ROS:  Headache - No Fatigue - No Visual Disturbances - No Chest pain - No Dyspnea - No Palpitations - No LE edema - No   2. HLD Regular diet, no structured exercise.   3. Hypokalemia She is compliant with potassium supplement.   4. Blood sugar She had a elevated blood sugar when using her sisters glucometer. She was not fasting but she is worried about diabetes. Postprandial blood sugar of 200.  5. Generalized OA DDD Pain assessment: Cause of pain- generalized OA Pain location- all joints, mostly shoulder, hips, back, knees, hands Pain on scale of 1-10:  8 She takes her pain medication 2x a day some days and not all other days. She also takes meloxicam, biofeeze, and plain tylenol prn. She also uses topical pain ointments. She has tried PT multiple times. She has had numerous joint injections. She also uses a Scientist, clinical (histocompatibility and immunogenetics). She has started using a cane prn. She last took her opiate 4-5 days ago.  Frequency- daily What increases pain- activity, bending, twisting What makes pain Better-rest, pain medicine improves pain to 6/10 Effects on ADL - difficulty doing housework,  Any change in general medical condition-no  Current opioids rx- Norco q 6 hours prn # meds rx- 60 tablets monthly Effectiveness of current meds- brings pain down to a 2/10 Adverse reactions from pain meds- denies Morphine equivalent- 40 mme per Rx Pill  count performed-No Last drug screen - 02/15/22 ( high risk q33m, moderate risk q58m, low risk yearly ) Urine drug screen today- No Was the NCCSR reviewed- yes  If yes were their any concerning findings? - no  Overdose risk: 140  Pain contract signed on: 02/15/22    Past Medical History:  Diagnosis Date   Arthritis    "all over" (03/18/2013)   Carpal tunnel syndrome    "right" (03/18/2013)   Fracture 2013   RLE   GERD (gastroesophageal reflux disease)    OTC   Hypertension    IBS (irritable bowel syndrome)    Osteoarthritis of left hip    Osteopenia 05/28/2021   Seizures (HCC) 1960's; 1978   "when I was little; when I was 7 months pregnant" (03/18/2013)   Shortness of breath    "not since I quit smoking" (03/18/2013)    Past Surgical History:  Procedure Laterality Date   ANTERIOR CERVICAL DECOMP/DISCECTOMY FUSION  ?2003   ANTERIOR CERVICAL DECOMP/DISCECTOMY FUSION N/A 04/24/2018   Procedure: C6-7 PLATE REMOVAL, N2-3, C5-6 ANTERIOR CERVICAL DECOMPRESSION/DISCECTOMY & FUSION, ALLOGRAFT, PLATE;  Surgeon: Eldred Manges, MD;  [facility];  Service: Orthopedics;  Laterality: N/A;   CHOLECYSTECTOMY  2003   COLONOSCOPY N/A 02/17/2014   Procedure: COLONOSCOPY;  Surgeon: Malissa Hippo, MD;  [facility];  Service: Endoscopy;  Laterality: N/A;  145-moved to 10:30 Ann to notify pt   COLONOSCOPY N/A 08/25/2019   Procedure: COLONOSCOPY;  Surgeon: Lionel December  U, MD;  [facility];  Service: Endoscopy;  Laterality: N/A;  1030   FOOT ARTHROTOMY Right 1990's   pin, after removing a bone    JOINT REPLACEMENT Left    hip and right   pinched nerve     POLYPECTOMY  08/25/2019   Procedure: POLYPECTOMY;  Surgeon: Malissa Hippo, MD;  [facility];  Service: Endoscopy;;   TOTAL HIP ARTHROPLASTY  06/30/2012   Procedure: TOTAL HIP ARTHROPLASTY;  Surgeon: Kerrin Champagne, MD;  [facility]: [facility];  Service: Orthopedics;  Laterality: Left;  Left total hip replacement with  metal and polypropylene pore coated implants   TOTAL HIP ARTHROPLASTY Right 03/16/2013   Procedure: RIGHT TOTAL HIP ARTHROPLASTY- right ;  Surgeon: Kerrin Champagne, MD;  [facility]: [facility];  Service: Orthopedics;  Laterality: Right;   TUBAL LIGATION  1978   VAGINAL HYSTERECTOMY  ~ November 03, 2002    Family History  Problem Relation Age of Onset   Hypertension Father    Diabetes Father    Hypertension Sister    Diabetes Sister    Hypertension Brother    Diabetes Brother    Diabetes Mother    Hypertension Mother    Hyperlipidemia Mother    COPD Brother    Heart disease Brother    Hypertension Brother     Social History   Socioeconomic History   Marital status: Divorced    Spouse name: Not on file   Number of children: 1   Years of education: 9   Highest education level: 9th grade  Occupational History    Employer: DISABLED  Tobacco Use   Smoking status: Former    Current packs/day: 0.25    Average packs/day: 0.3 packs/day for 25.0 years (6.3 ttl pk-yrs)    Types: Cigarettes   Smokeless tobacco: Never   Tobacco comments:    Light smoker - has tried to quit, but always starts back  Vaping Use   Vaping status: Never Used  Substance and Sexual Activity   Alcohol use: No    Alcohol/week: 0.0 standard drinks of alcohol   Drug use: No   Sexual activity: Yes  Other Topics Concern   Not on file  Social History Narrative   Her significant other of 15 years passed away in 2020-11-03   2022/06/04 - She recently adopted a one year old. (From her ex-fiance's grandchild who's parents didn't take care of her well and she was taken by social services)   Daughter and mother live nearby   Social Determinants of Health   Financial Resource Strain: Low Risk  (08/01/2023)   Overall Financial Resource Strain (CARDIA)    Difficulty of Paying Living Expenses: Not hard at all  Food Insecurity: No Food Insecurity (08/01/2023)   Hunger Vital Sign    Worried About Running Out of Food in the Last Year: Never true     Ran Out of Food in the Last Year: Never true  Transportation Needs: No Transportation Needs (08/01/2023)   PRAPARE - Administrator, Civil Service (Medical): No    Lack of Transportation (Non-Medical): No  Physical Activity: Insufficiently Active (08/01/2023)   Exercise Vital Sign    Days of Exercise per Week: 3 days    Minutes of Exercise per Session: 30 min  Stress: No Stress Concern Present (08/01/2023)   [employer] of Occupational Health - Occupational Stress Questionnaire    Feeling of Stress : Not at all  Social Connections: Socially Isolated (08/01/2023)  Social Advertising account executive [NHANES]    Frequency of Communication with Friends and Family: More than three times a week    Frequency of Social Gatherings with Friends and Family: Once a week    Attends Religious Services: Never    Database administrator or Organizations: No    Attends Banker Meetings: Never    Marital Status: Divorced  Catering manager Violence: Not At Risk (08/01/2023)   Humiliation, Afraid, Rape, and Kick questionnaire    Fear of Current or Ex-Partner: No    Emotionally Abused: No    Physically Abused: No    Sexually Abused: No    Outpatient Medications Prior to Visit  Medication Sig Dispense Refill   albuterol (VENTOLIN HFA) 108 (90 Base) MCG/ACT inhaler INHALE 2 PUFFS INTO LUNGS EVERY 6 HOURS AS NEEDED FOR WHEEZING OR SHORTNESS OF BREATH 18 g 2   Calcium Carb-Cholecalciferol (CALCIUM 600+D3 PO) Take 1 tablet by mouth 2 (two) times daily.     hydrochlorothiazide (HYDRODIURIL) 25 MG tablet Take 1 tablet (25 mg total) by mouth daily. 90 tablet 3   loratadine (CLARITIN) 10 MG tablet Take 10 mg by mouth daily.     losartan (COZAAR) 50 MG tablet Take 1 tablet by mouth once daily 90 tablet 0   meloxicam (MOBIC) 7.5 MG tablet Take 1 tablet by mouth once daily 90 tablet 0   naloxone (NARCAN) nasal spray 4 mg/0.1 mL Nasally for overdose 1 each 3   potassium chloride  (KLOR-CON) 10 MEQ tablet Take 1 tablet by mouth once daily 90 tablet 3   No facility-administered medications prior to visit.    No Known Allergies  ROS Review of Systems As per HPI.    Objective:    Physical Exam Vitals and nursing note reviewed.  Constitutional:      General: She is not in acute distress.    Appearance: Normal appearance. She is not ill-appearing, toxic-appearing or diaphoretic.  HENT:     Head: Normocephalic and atraumatic.     Mouth/Throat:     Mouth: Mucous membranes are moist.     Pharynx: Oropharynx is clear.  Eyes:     Extraocular Movements: Extraocular movements intact.     Conjunctiva/sclera: Conjunctivae normal.     Pupils: Pupils are equal, round, and reactive to light.  Cardiovascular:     Rate and Rhythm: Normal rate and regular rhythm.     Pulses: Normal pulses.     Heart sounds: Normal heart sounds. No murmur heard. Pulmonary:     Effort: Pulmonary effort is normal. No respiratory distress.     Breath sounds: Normal breath sounds.  Musculoskeletal:        General: No swelling.     Lumbar back: Tenderness present. No swelling, edema, deformity, signs of trauma or bony tenderness.     Right lower leg: No edema.     Left lower leg: No edema.  Skin:    General: Skin is warm and dry.  Neurological:     General: No focal deficit present.     Mental Status: She is alert and oriented to person, place, and time.  Psychiatric:        Mood and Affect: Mood normal.        Behavior: Behavior normal.        Thought Content: Thought content normal.        Judgment: Judgment normal.     BP 111/68   Pulse 82   Temp Marland Kitchen)  97 F (36.1 C) (Temporal)   Ht 5\' 4"  (1.626 m)   Wt 165 lb 4 oz (75 kg)   SpO2 98%   BMI 28.37 kg/m  Wt Readings from Last 3 Encounters:  09/03/23 165 lb 4 oz (75 kg)  08/01/23 165 lb (74.8 kg)  06/04/23 163 lb (73.9 kg)     Health Maintenance Due  Topic Date Due   DEXA SCAN  05/26/2023   INFLUENZA VACCINE   07/10/2023      There are no preventive care reminders to display for this patient.  Lab Results  Component Value Date   TSH 1.220 02/20/2023   Lab Results  Component Value Date   WBC 7.4 02/20/2023   HGB 13.3 02/20/2023   HCT 39.9 02/20/2023   MCV 82 02/20/2023   PLT 260 02/20/2023   Lab Results  Component Value Date   NA 140 02/20/2023   K 3.8 02/20/2023   CO2 24 02/20/2023   GLUCOSE 91 02/20/2023   BUN 7 (L) 02/20/2023   CREATININE 0.74 02/20/2023   BILITOT 0.8 08/28/2022   ALKPHOS 94 08/28/2022   AST 18 08/28/2022   ALT 8 08/28/2022   PROT 6.8 08/28/2022   ALBUMIN 4.2 08/28/2022   CALCIUM 9.6 02/20/2023   ANIONGAP 11 04/10/2018   EGFR 89 02/20/2023   Lab Results  Component Value Date   CHOL 151 02/20/2023   Lab Results  Component Value Date   HDL 57 02/20/2023   Lab Results  Component Value Date   LDLCALC 71 02/20/2023   Lab Results  Component Value Date   TRIG 131 02/20/2023   Lab Results  Component Value Date   CHOLHDL 2.6 02/20/2023   Lab Results  Component Value Date   HGBA1C 4.6 (L) 05/16/2022      Assessment & Plan:   Briniyah was seen today for medical management of chronic issues and hypertension.  Diagnoses and all orders for this visit:  Primary hypertension Well controlled on current regimen.   Mixed hyperlipidemia Diet, exercise. Last LDL at 71.  Hypokalemia On supplement. BMP pending.  -     BMP8+EGFR  Elevated blood sugar Home blood sugar. Will check A1c, thought discussed likely normal as she was not fasting.  -     Bayer DCA Hb A1c Waived  Generalized OA DDD (degenerative disc disease), lumbar Controlled substance agreement signed Well controlled on current regimen. CSA is UTD. UDS today. She has not had pain medication in 4-5 days as she doesn't always take this daily and her monthly prescriptions on only 15 days worth. PDMP reviewed, no red flags.  -     HYDROcodone-acetaminophen (NORCO/VICODIN) 5-325 MG tablet;  Take 1 tablet by mouth every 6 (six) hours as needed for moderate pain. -     HYDROcodone-acetaminophen (NORCO/VICODIN) 5-325 MG tablet; Take 1 tablet by mouth every 6 (six) hours as needed for moderate pain. -     HYDROcodone-acetaminophen (NORCO/VICODIN) 5-325 MG tablet; Take 1 tablet by mouth every 6 (six) hours as needed for moderate pain. -     ToxASSURE Select 13 (MW), Urine  Need for immunization against influenza Flu vaccine today.   Follow-up: Return in about 3 months (around 12/03/2023) for chronic follow up.   The patient indicates understanding of these issues and agrees with the plan.  Gabriel Earing, FNP

## 2023-09-05 DIAGNOSIS — M85831 Other specified disorders of bone density and structure, right forearm: Secondary | ICD-10-CM | POA: Diagnosis not present

## 2023-09-05 DIAGNOSIS — Z780 Asymptomatic menopausal state: Secondary | ICD-10-CM | POA: Diagnosis not present

## 2023-09-09 LAB — TOXASSURE SELECT 13 (MW), URINE

## 2023-11-03 ENCOUNTER — Other Ambulatory Visit: Payer: Self-pay | Admitting: Family Medicine

## 2023-11-03 DIAGNOSIS — I10 Essential (primary) hypertension: Secondary | ICD-10-CM

## 2023-11-20 ENCOUNTER — Ambulatory Visit: Payer: 59 | Admitting: Family Medicine

## 2023-11-20 ENCOUNTER — Encounter: Payer: Self-pay | Admitting: Family Medicine

## 2023-11-20 VITALS — Temp 98.2°F | Ht 64.0 in | Wt 166.0 lb

## 2023-11-20 DIAGNOSIS — R269 Unspecified abnormalities of gait and mobility: Secondary | ICD-10-CM

## 2023-11-20 DIAGNOSIS — R42 Dizziness and giddiness: Secondary | ICD-10-CM | POA: Diagnosis not present

## 2023-11-20 DIAGNOSIS — H538 Other visual disturbances: Secondary | ICD-10-CM

## 2023-11-20 DIAGNOSIS — H519 Unspecified disorder of binocular movement: Secondary | ICD-10-CM | POA: Diagnosis not present

## 2023-11-20 MED ORDER — MECLIZINE HCL 12.5 MG PO TABS: 12.5000 mg | ORAL_TABLET | Freq: Three times a day (TID) | ORAL | 0 refills | Status: AC | PRN

## 2023-11-20 NOTE — Progress Notes (Signed)
Acute Office Visit  Subjective:     Patient ID: Kathy Barr, female    DOB: 1956/10/04, 67 y.o.   MRN: [id number]  Chief Complaint  Patient presents with   Dizziness   Blurred Vision    HPI Patient is in today for dizziness that started 2 days ago. She was walking down the steps on the porch right knee gave out suddenly. She caught herself before she feel. Since then she has had pain in her right knee. She has an appt with ortho upcoming for knee pain. She reports that dizziness started shortly after this. She reports that the dizziness occurs when she moves from sitting to standing or with head movement. She has been holding on to things as she walks to help with her balance. It feels like a lightheadedness, denies vertigo sensation. Reports some blurred vision in left eye for a few weeks. Hx of congential strabismus of left eye. Denies numbness, tingling, weakness, confusion, changes in speech, HA, cough, congestion. Hx of vertigo many years ago.   ROS As per HPI.      Objective:    Temp 98.2 F (36.8 C) (Temporal)   Ht 5\' 4"  (1.626 m)   Wt 166 lb (75.3 kg)   SpO2 96%   BMI 28.49 kg/m    No data found.   Physical Exam Vitals and nursing note reviewed.  Constitutional:      General: She is not in acute distress.    Appearance: She is not ill-appearing, toxic-appearing or diaphoretic.  HENT:     Right Ear: Tympanic membrane, ear canal and external ear normal.     Left Ear: Tympanic membrane, ear canal and external ear normal.     Nose: Nose normal.     Mouth/Throat:     Mouth: Mucous membranes are moist.     Pharynx: Oropharynx is clear.  Eyes:     General: Lids are normal.        Right eye: No discharge.        Left eye: No discharge.     Extraocular Movements:     Right eye: Normal extraocular motion.     Left eye: Abnormal extraocular motion present.     Comments: No convergence of left eye. Pupils reactive.   Cardiovascular:     Rate and Rhythm: Normal  rate and regular rhythm.     Heart sounds: Normal heart sounds. No murmur heard. Pulmonary:     Effort: Pulmonary effort is normal. No respiratory distress.     Breath sounds: Normal breath sounds. No wheezing or rhonchi.  Musculoskeletal:     Cervical back: Neck supple. No rigidity.     Right lower leg: No edema.     Left lower leg: No edema.  Skin:    General: Skin is warm and dry.  Neurological:     Mental Status: She is alert and oriented to person, place, and time.     Cranial Nerves: No facial asymmetry.     Sensory: Sensation is intact.     Motor: No weakness.     Coordination: Romberg sign negative. Coordination normal. Finger-Nose-Finger Test normal. Rapid alternating movements normal.     Gait: Gait abnormal and tandem walk abnormal.  Psychiatric:        Mood and Affect: Mood normal.        Behavior: Behavior normal.     No results found for any visits on 11/20/23.      Assessment & Plan:  [name] was seen today for dizziness and blurred vision.  Diagnoses and all orders for this visit:  Dizzinesses -     CT HEAD WO CONTRAST ( ); Future -     meclizine (ANTIVERT) 12.5 MG tablet; Take 1 tablet (12.5 mg total) by mouth 3 (three) times daily as needed for dizziness.  Blurred vision, left eye -     CT HEAD WO CONTRAST ( ); Future  Abnormal gait  Abnormal tandem gait test -     CT HEAD WO CONTRAST ( ); Future  Abnormal eye movements  Dizziness x 2 days. Blurred vision of left eye for a few weeks. Abnormal EOM of left eye today on exam. 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 Her gait is abnormal today and she is unable to perform a tandem gait. There is a 20 pt drop in her systolic BP today. Discussed CVA vs vertigo vs orthostatic hypotension. Discussed need for STAT CT to evaluate for CVA. She is unable to go today due to providing care for her foster child. Discussed urgency of evaluation for best results. Will order STAT CT for  tomorrow however instructed patient to seek emergency care in the meantime for new or worsening symptoms. Can try meclizine in the meantime, but again stressed the important of need for CT scan to evaluate for CVA.  The patient indicates understanding of these issues and agrees with the plan.  Gabriel Earing, FNP

## 2023-11-21 ENCOUNTER — Ambulatory Visit (HOSPITAL_COMMUNITY)
Admission: RE | Admit: 2023-11-21 | Discharge: 2023-11-21 | Disposition: A | Discharge status: Home / Self Care | Payer: 59 | Source: Ambulatory Visit | Attending: Family Medicine | Admitting: Family Medicine

## 2023-11-21 DIAGNOSIS — R42 Dizziness and giddiness: Secondary | ICD-10-CM | POA: Insufficient documentation

## 2023-11-21 DIAGNOSIS — R269 Unspecified abnormalities of gait and mobility: Secondary | ICD-10-CM | POA: Insufficient documentation

## 2023-11-21 DIAGNOSIS — H538 Other visual disturbances: Secondary | ICD-10-CM | POA: Diagnosis not present

## 2023-11-21 DIAGNOSIS — R29818 Other symptoms and signs involving the nervous system: Secondary | ICD-10-CM | POA: Diagnosis not present

## 2023-11-21 DIAGNOSIS — Q043 Other reduction deformities of brain: Secondary | ICD-10-CM | POA: Diagnosis not present

## 2023-11-27 ENCOUNTER — Other Ambulatory Visit (INDEPENDENT_AMBULATORY_CARE_PROVIDER_SITE_OTHER): Payer: 59

## 2023-11-27 ENCOUNTER — Encounter: Payer: Self-pay | Admitting: Orthopaedic Surgery

## 2023-11-27 ENCOUNTER — Ambulatory Visit: Payer: 59 | Admitting: Orthopaedic Surgery

## 2023-11-27 VITALS — Ht 64.0 in | Wt 165.0 lb

## 2023-11-27 DIAGNOSIS — M25561 Pain in right knee: Secondary | ICD-10-CM | POA: Diagnosis not present

## 2023-11-27 NOTE — Progress Notes (Signed)
Office Visit Note   Patient: Kathy Barr           Date of Birth: July 07, 1956           MRN: [id number] Visit Date: 11/27/2023              Requested by: Gabriel Earing, FNP [address] [place],  [place] [address] PCP: Gabriel Earing, FNP   Assessment & Plan: Visit Diagnoses:  1. Acute pain of right knee     Plan: Will set her up for visit with partners to discuss total knee arthroplasty after the holidays.  Follow-Up Instructions: No follow-ups on file.   Orders:  Orders Placed This Encounter  Procedures   XR KNEE 3 VIEW RIGHT   No orders of the defined types were placed in this encounter.     Procedures: No procedures performed   Clinical Data: No additional findings.   Subjective: Chief Complaint  Patient presents with   Right Knee - Pain    HPI 67 year old female previous total hip arthroplasty right and left by Dr. Otelia Sergeant.  I did her neck surgery cervical fusion 2019 doing well.  Current problem is progressive right knee arthritis bone-on-bone medial compartment failed.  With anti-inflammatories and intra-articular injections.  She has a little bit of likely carpal tunnel in the left wrist but wears a wrist splint which is helping.  She states she is ready proceed with total knee arthroplasty right knee.  She states she like to have it sometime after Christmas.  She has been taking Norco 5/325 1 p.o. twice daily supplied by PCP.  Review of Systems past history of diabetes hypertension hip replacement cervical fusion.  Past history of problems with back pain.  Current severe right knee pain.   Objective: Vital Signs: Ht 5\' 4"  (1.626 m)   Wt 165 lb (74.8 kg)   BMI 28.32 kg/m   Physical Exam Constitutional:      Appearance: She is well-developed.  HENT:     Head: Normocephalic.     Right Ear: External ear normal.     Left Ear: External ear normal. There is no impacted cerumen.  Eyes:     Pupils: Pupils are equal, round, and reactive to light.   Neck:     Thyroid: No thyromegaly.     Trachea: No tracheal deviation.  Cardiovascular:     Rate and Rhythm: Normal rate.  Pulmonary:     Effort: Pulmonary effort is normal.  Abdominal:     Palpations: Abdomen is soft.  Musculoskeletal:     Cervical back: No rigidity.  Skin:    General: Skin is warm and dry.  Neurological:     Mental Status: She is alert and oriented to person, place, and time.  Psychiatric:        Behavior: Behavior normal.     Ortho Exam severe right medial joint pain crepitus with knee range of motion amatory with a right knee limp negative logroll hips.  Specialty Comments:  No specialty comments available.  Imaging: No results found.   PMFS History: Patient Active Problem List   Diagnosis Date Noted   Mixed hyperlipidemia 09/03/2023   Elevated blood sugar 09/03/2023   Numbness and tingling in left hand 06/05/2023   Right groin pain 02/07/2023   Hypokalemia 11/21/2022   Chondromalacia patellae, right knee 05/23/2022   Mild intermittent asthma without complication 05/16/2022   Osteopenia 05/28/2021   Controlled substance agreement signed 02/23/2021   Hx of  colonic polyps 02/15/2019   Fusion of spine of cervical region 04/24/2018   Other spondylosis with radiculopathy, cervical region 03/05/2018   Hypertriglyceridemia 08/27/2017   Vertigo 02/04/2017   Atrophic vulvovaginitis 06/05/2015   DDD (degenerative disc disease), lumbar 01/13/2015   IBS (irritable bowel syndrome) 07/12/2014   Osteoarthritis of right hip 03/16/2013    Class: Chronic   Generalized OA 10/29/2012   Primary hypertension 10/29/2012   Past Medical History:  Diagnosis Date   Arthritis    "all over" (03/18/2013)   Carpal tunnel syndrome    "right" (03/18/2013)   Fracture 2013   RLE   GERD (gastroesophageal reflux disease)    OTC   Hypertension    IBS (irritable bowel syndrome)    Osteoarthritis of left hip    Osteopenia 05/28/2021   Seizures (HCC) 1960's; 1978    "when I was little; when I was 7 months pregnant" (03/18/2013)   Shortness of breath    "not since I quit smoking" (03/18/2013)    Family History  Problem Relation Age of Onset   Hypertension Father    Diabetes Father    Hypertension Sister    Diabetes Sister    Hypertension Brother    Diabetes Brother    Diabetes Mother    Hypertension Mother    Hyperlipidemia Mother    COPD Brother    Heart disease Brother    Hypertension Brother     Past Surgical History:  Procedure Laterality Date   ANTERIOR CERVICAL DECOMP/DISCECTOMY FUSION  ?2003   ANTERIOR CERVICAL DECOMP/DISCECTOMY FUSION N/A 04/24/2018   Procedure: C6-7 PLATE REMOVAL, X3-2, C5-6 ANTERIOR CERVICAL DECOMPRESSION/DISCECTOMY & FUSION, ALLOGRAFT, PLATE;  Surgeon: Eldred Manges, MD;  Location: [facility];  Service: Orthopedics;  Laterality: N/A;   CHOLECYSTECTOMY  2003   COLONOSCOPY N/A 02/17/2014   Procedure: COLONOSCOPY;  Surgeon: Malissa Hippo, MD;  [facility];  Service: Endoscopy;  Laterality: N/A;  145-moved to 10:30 Ann to notify pt   COLONOSCOPY N/A 08/25/2019   Procedure: COLONOSCOPY;  Surgeon: Malissa Hippo, MD;  [facility];  Service: Endoscopy;  Laterality: N/A;  1030   FOOT ARTHROTOMY Right 1990's   pin, after removing a bone    JOINT REPLACEMENT Left    hip and right   pinched nerve     POLYPECTOMY  08/25/2019   Procedure: POLYPECTOMY;  Surgeon: Malissa Hippo, MD;  [facility];  Service: Endoscopy;;   TOTAL HIP ARTHROPLASTY  06/30/2012   Procedure: TOTAL HIP ARTHROPLASTY;  Surgeon: Kerrin Champagne, MD;  Location: [facility];  Service: Orthopedics;  Laterality: Left;  Left total hip replacement with metal and polypropylene pore coated implants   TOTAL HIP ARTHROPLASTY Right 03/16/2013   Procedure: RIGHT TOTAL HIP ARTHROPLASTY- right ;  Surgeon: Kerrin Champagne, MD;  Location: [facility];  Service: Orthopedics;  Laterality: Right;   TUBAL LIGATION  1978   VAGINAL HYSTERECTOMY  ~ 2003   Social  History   Occupational History    Employer: DISABLED  Tobacco Use   Smoking status: Former    Current packs/day: 0.25    Average packs/day: 0.3 packs/day for 25.0 years (6.3 ttl pk-yrs)    Types: Cigarettes   Smokeless tobacco: Never   Tobacco comments:    Light smoker - has tried to quit, but always starts back  Vaping Use   Vaping status: Never Used  Substance and Sexual Activity   Alcohol use: No    Alcohol/week: 0.0  standard drinks of alcohol   Drug use: No   Sexual activity: Yes

## 2023-11-28 ENCOUNTER — Other Ambulatory Visit: Payer: Self-pay | Admitting: Family Medicine

## 2023-11-28 DIAGNOSIS — I10 Essential (primary) hypertension: Secondary | ICD-10-CM

## 2023-11-28 NOTE — Telephone Encounter (Signed)
Copied from CRM [id number]. Topic: Clinical - Medication Refill >> Nov 28, 2023 10:43 AM Nada Libman H wrote: Most Recent Primary Care Visit:  Provider: Gabriel Earing  Department: [facility]  Visit Type: OFFICE VISIT  Date: 11/20/2023  Medication: losartan (COZAAR) 50 MG tablet  Has the patient contacted their pharmacy? Yes (Agent: If no, request that the patient contact the pharmacy for the refill. If patient does not wish to contact the pharmacy document the reason why and proceed with request.) (Agent: If yes, when and what did the pharmacy advise?)She didn't have any refills  Is this the correct pharmacy for this prescription? Yes If no, delete pharmacy and type the correct one.  This is the patient's preferred pharmacy:  [facility] [address], [place] - [address] [place] [address] [place] [address] Phone: [phone] Fax: [phone]   Has the prescription been filled recently? No  Is the patient out of the medication? No  Has the patient been seen for an appointment in the last year OR does the patient have an upcoming appointment? No  Can we respond through MyChart? No  Agent: Please be advised that Rx refills may take up to 3 business days. We ask that you follow-up with your pharmacy.

## 2023-12-01 MED ORDER — LOSARTAN POTASSIUM 50 MG PO TABS: 50.0000 mg | ORAL_TABLET | Freq: Every day | ORAL | 0 refills | Status: DC

## 2023-12-08 ENCOUNTER — Encounter: Payer: Self-pay | Admitting: Family Medicine

## 2023-12-08 ENCOUNTER — Ambulatory Visit: Payer: 59 | Admitting: Family Medicine

## 2023-12-08 VITALS — BP 137/85 | HR 98 | Temp 98.4°F | Ht 64.0 in | Wt 161.2 lb

## 2023-12-08 DIAGNOSIS — E876 Hypokalemia: Secondary | ICD-10-CM

## 2023-12-08 DIAGNOSIS — Z79899 Other long term (current) drug therapy: Secondary | ICD-10-CM | POA: Diagnosis not present

## 2023-12-08 DIAGNOSIS — M51362 Other intervertebral disc degeneration, lumbar region with discogenic back pain and lower extremity pain: Secondary | ICD-10-CM

## 2023-12-08 DIAGNOSIS — E663 Overweight: Secondary | ICD-10-CM | POA: Diagnosis not present

## 2023-12-08 DIAGNOSIS — M159 Polyosteoarthritis, unspecified: Secondary | ICD-10-CM | POA: Diagnosis not present

## 2023-12-08 DIAGNOSIS — R42 Dizziness and giddiness: Secondary | ICD-10-CM

## 2023-12-08 DIAGNOSIS — E782 Mixed hyperlipidemia: Secondary | ICD-10-CM

## 2023-12-08 DIAGNOSIS — I10 Essential (primary) hypertension: Secondary | ICD-10-CM

## 2023-12-08 DIAGNOSIS — J4520 Mild intermittent asthma, uncomplicated: Secondary | ICD-10-CM

## 2023-12-08 MED ORDER — HYDROCODONE-ACETAMINOPHEN 5-325 MG PO TABS: 1.0000 | ORAL_TABLET | Freq: Four times a day (QID) | ORAL | 0 refills | Status: DC | PRN

## 2023-12-08 MED ORDER — LOSARTAN POTASSIUM 50 MG PO TABS: 50.0000 mg | ORAL_TABLET | Freq: Every day | ORAL | 3 refills | Status: DC

## 2023-12-08 MED ORDER — MELOXICAM 7.5 MG PO TABS: 7.5000 mg | ORAL_TABLET | Freq: Every day | ORAL | 0 refills | Status: DC

## 2023-12-08 NOTE — Assessment & Plan Note (Signed)
Last LDL 71. Diet, exercise, weight loss.

## 2023-12-08 NOTE — Assessment & Plan Note (Signed)
Improving. Continue meclizine prn. Had CT to r/o CVA.

## 2023-12-08 NOTE — Assessment & Plan Note (Signed)
No symptoms currently.

## 2023-12-08 NOTE — Assessment & Plan Note (Addendum)
UDS: 09/03/23. PDMP reviewed, no red flags. CSA is UTD. Refills provided.

## 2023-12-08 NOTE — Assessment & Plan Note (Signed)
Weight is trending down. Diet, exercise, weight loss.

## 2023-12-08 NOTE — Progress Notes (Signed)
Established Patient Office Visit  Subjective:  Patient ID: Kathy Barr, female    DOB: 10-07-1956  Age: 67 y.o. MRN: [id number]  CC:  Chief Complaint  Patient presents with   Medical Management of Chronic Issues    HPI DELICIA Barr presents for chronic follow up.  HTN Complaint with meds - Yes Current Medications - losartan and HCTZ  Checking BP at home ranging- not checking at home Exercising Regularly - stays active during the day but no exercise Watching Salt intake - Yes Pertinent ROS:  Headache - No Fatigue - No Visual Disturbances - No Chest pain - No Dyspnea - No Palpitations - No LE edema - No  2. HLD Regular diet, no structured exercise.   3. Hypokalemia She is compliant with potassium supplement.   4. Asthma Denies symptoms.   5. Vertigo Improving. Mild symptoms now. Taking meclizine prn.   6. Generalized OA DDD Pain assessment: Cause of pain- generalized OA Pain location- all joints, mostly shoulder, hips, back, knees, hands Pain on scale of 1-10:  8 She takes her pain medication 2x a day some days and not all other days. She also takes meloxicam, biofeeze, and plain tylenol prn. She also uses topical pain ointments. She has tried PT multiple times. She has had numerous joint injections. She also uses a Scientist, clinical (histocompatibility and immunogenetics). She has started using a cane prn. Recently saw ortho and has plans for right total knee replacement.  Frequency- daily What increases pain- activity, bending, twisting What makes pain Better-rest, pain medicine improves pain to 6/10 Effects on ADL - difficulty doing housework,  Any change in general medical condition-no  Current opioids rx- Norco q 6 hours prn # meds rx- 60 tablets monthly Effectiveness of current meds- brings pain down to a 2/10 Adverse reactions from pain meds- denies Morphine equivalent- 40 mme per Rx Pill count performed-No Last drug screen - 02/15/22 ( high risk q63m, moderate risk q93m, low risk yearly ) Urine  drug screen today- No Was the NCCSR reviewed- yes  If yes were their any concerning findings? - no  Overdose risk: 140  Pain contract signed on: 09/03/23    Past Medical History:  Diagnosis Date   Arthritis    "all over" (03/18/2013)   Carpal tunnel syndrome    "right" (03/18/2013)   Fracture 2013   RLE   GERD (gastroesophageal reflux disease)    OTC   Hypertension    IBS (irritable bowel syndrome)    Osteoarthritis of left hip    Osteopenia 05/28/2021   Seizures (HCC) 1960's; 1978   "when I was little; when I was 7 months pregnant" (03/18/2013)   Shortness of breath    "not since I quit smoking" (03/18/2013)    Past Surgical History:  Procedure Laterality Date   ANTERIOR CERVICAL DECOMP/DISCECTOMY FUSION  ?2003   ANTERIOR CERVICAL DECOMP/DISCECTOMY FUSION N/A 04/24/2018   Procedure: C6-7 PLATE REMOVAL, J4-7, C5-6 ANTERIOR CERVICAL DECOMPRESSION/DISCECTOMY & FUSION, ALLOGRAFT, PLATE;  Surgeon: Eldred Manges, MD;  Location: [facility] OR;  Service: Orthopedics;  Laterality: N/A;   CHOLECYSTECTOMY  2003   COLONOSCOPY N/A 02/17/2014   Procedure: COLONOSCOPY;  Surgeon: Malissa Hippo, MD;  Location: [facility];  Service: Endoscopy;  Laterality: N/A;  145-moved to 10:30 Ann to notify pt   COLONOSCOPY N/A 08/25/2019   Procedure: COLONOSCOPY;  Surgeon: Malissa Hippo, MD;  Location: [facility];  Service: Endoscopy;  Laterality: N/A;  1030   FOOT ARTHROTOMY Right 1990's  pin, after removing a bone    JOINT REPLACEMENT Left    hip and right   pinched nerve     POLYPECTOMY  08/25/2019   Procedure: POLYPECTOMY;  Surgeon: Malissa Hippo, MD;  Location: [facility];  Service: Endoscopy;;   TOTAL HIP ARTHROPLASTY  06/30/2012   Procedure: TOTAL HIP ARTHROPLASTY;  Surgeon: Kerrin Champagne, MD;  Location: [facility] OR;  Service: Orthopedics;  Laterality: Left;  Left total hip replacement with metal and polypropylene pore coated implants   TOTAL HIP ARTHROPLASTY Right 03/16/2013   Procedure: RIGHT  TOTAL HIP ARTHROPLASTY- right ;  Surgeon: Kerrin Champagne, MD;  Location: [facility] OR;  Service: Orthopedics;  Laterality: Right;   TUBAL LIGATION  1978   VAGINAL HYSTERECTOMY  ~ 31-Dec-2001    Family History  Problem Relation Age of Onset   Hypertension Father    Diabetes Father    Hypertension Sister    Diabetes Sister    Hypertension Brother    Diabetes Brother    Diabetes Mother    Hypertension Mother    Hyperlipidemia Mother    COPD Brother    Heart disease Brother    Hypertension Brother     Social History   Socioeconomic History   Marital status: Divorced    Spouse name: Not on file   Number of children: 1   Years of education: 9   Highest education level: 9th grade  Occupational History    Employer: DISABLED  Tobacco Use   Smoking status: Former    Current packs/day: 0.25    Average packs/day: 0.3 packs/day for 25.0 years (6.3 ttl pk-yrs)    Types: Cigarettes   Smokeless tobacco: Never   Tobacco comments:    Light smoker - has tried to quit, but always starts back  Vaping Use   Vaping status: Never Used  Substance and Sexual Activity   Alcohol use: No    Alcohol/week: 0.0 standard drinks of alcohol   Drug use: No   Sexual activity: Yes  Other Topics Concern   Not on file  Social History Narrative   Her significant other of 15 years passed away in Jan 01, 2020   16-Jun-2022 - She recently adopted a one year old. (From her ex-fiance's grandchild who's parents didn't take care of her well and she was taken by social services)   Daughter and mother live nearby   Social Drivers of Health   Financial Resource Strain: Low Risk  (08/01/2023)   Overall Financial Resource Strain (CARDIA)    Difficulty of Paying Living Expenses: Not hard at all  Food Insecurity: No Food Insecurity (08/01/2023)   Hunger Vital Sign    Worried About Running Out of Food in the Last Year: Never true    Ran Out of Food in the Last Year: Never true  Transportation Needs: No Transportation Needs (08/01/2023)    PRAPARE - Administrator, Civil Service (Medical): No    Lack of Transportation (Non-Medical): No  Physical Activity: Insufficiently Active (08/01/2023)   Exercise Vital Sign    Days of Exercise per Week: 3 days    Minutes of Exercise per Session: 30 min  Stress: No Stress Concern Present (08/01/2023)   [employer] of Occupational Health - Occupational Stress Questionnaire    Feeling of Stress : Not at all  Social Connections: Socially Isolated (08/01/2023)   Social Connection and Isolation Panel [NHANES]    Frequency of Communication with Friends and Family: More than three times a  week    Frequency of Social Gatherings with Friends and Family: Once a week    Attends Religious Services: Never    Database administrator or Organizations: No    Attends Banker Meetings: Never    Marital Status: Divorced  Catering manager Violence: Not At Risk (08/01/2023)   Humiliation, Afraid, Rape, and Kick questionnaire    Fear of Current or Ex-Partner: No    Emotionally Abused: No    Physically Abused: No    Sexually Abused: No    Outpatient Medications Prior to Visit  Medication Sig Dispense Refill   albuterol (VENTOLIN HFA) 108 (90 Base) MCG/ACT inhaler INHALE 2 PUFFS INTO LUNGS EVERY 6 HOURS AS NEEDED FOR WHEEZING OR SHORTNESS OF BREATH 18 g 2   Calcium Carb-Cholecalciferol (CALCIUM 600+D3 PO) Take 1 tablet by mouth 2 (two) times daily.     hydrochlorothiazide (HYDRODIURIL) 25 MG tablet Take 1 tablet (25 mg total) by mouth daily. 90 tablet 3   loratadine (CLARITIN) 10 MG tablet Take 10 mg by mouth daily.     losartan (COZAAR) 50 MG tablet Take 1 tablet (50 mg total) by mouth daily. 90 tablet 0   meclizine (ANTIVERT) 12.5 MG tablet Take 1 tablet (12.5 mg total) by mouth 3 (three) times daily as needed for dizziness. 30 tablet 0   meloxicam (MOBIC) 7.5 MG tablet Take 1 tablet by mouth once daily 90 tablet 0   naloxone (NARCAN) nasal spray 4 mg/0.1 mL Nasally for  overdose 1 each 3   potassium chloride (KLOR-CON) 10 MEQ tablet Take 1 tablet by mouth once daily 90 tablet 3   No facility-administered medications prior to visit.    No Known Allergies  ROS Review of Systems As per HPI.    Objective:    Physical Exam Vitals and nursing note reviewed.  Constitutional:      General: She is not in acute distress.    Appearance: Normal appearance. She is not ill-appearing, toxic-appearing or diaphoretic.  HENT:     Head: Normocephalic and atraumatic.     Mouth/Throat:     Mouth: Mucous membranes are moist.     Pharynx: Oropharynx is clear.  Eyes:     Extraocular Movements: Extraocular movements intact.     Conjunctiva/sclera: Conjunctivae normal.     Pupils: Pupils are equal, round, and reactive to light.  Cardiovascular:     Rate and Rhythm: Normal rate and regular rhythm.     Pulses: Normal pulses.     Heart sounds: Normal heart sounds. No murmur heard. Pulmonary:     Effort: Pulmonary effort is normal. No respiratory distress.     Breath sounds: Normal breath sounds.  Musculoskeletal:        General: No swelling.     Lumbar back: Tenderness present. No swelling, edema, deformity, signs of trauma or bony tenderness.     Right lower leg: No edema.     Left lower leg: No edema.  Skin:    General: Skin is warm and dry.  Neurological:     General: No focal deficit present.     Mental Status: She is alert and oriented to person, place, and time.  Psychiatric:        Mood and Affect: Mood normal.        Behavior: Behavior normal.        Thought Content: Thought content normal.        Judgment: Judgment normal.     BP 137/85  Pulse 98   Temp 98.4 F (36.9 C) (Temporal)   Ht 5\' 4"  (1.626 m)   Wt 161 lb 3.2 oz (73.1 kg)   SpO2 98%   BMI 27.67 kg/m  Wt Readings from Last 3 Encounters:  12/08/23 161 lb 3.2 oz (73.1 kg)  11/27/23 165 lb (74.8 kg)  11/20/23 166 lb (75.3 kg)     Assessment & Plan:   Primary  hypertension Assessment & Plan: Well controlled with losartan, hydrochlorothiazide.  Orders: -     Losartan Potassium; Take 1 tablet (50 mg total) by mouth daily.  Dispense: 90 tablet; Refill: 3  Mixed hyperlipidemia Assessment & Plan: Last LDL 71. Diet, exercise, weight loss.    Overweight Assessment & Plan: Weight is trending down. Diet, exercise, weight loss.    Hypokalemia Assessment & Plan: Well controlled with supplementation.   Mild intermittent asthma without complication Assessment & Plan: No symptoms currently   Generalized OA -     HYDROcodone-Acetaminophen; Take 1 tablet by mouth every 6 (six) hours as needed for moderate pain (pain score 4-6).  Dispense: 60 tablet; Refill: 0 -     HYDROcodone-Acetaminophen; Take 1 tablet by mouth every 6 (six) hours as needed for moderate pain (pain score 4-6).  Dispense: 60 tablet; Refill: 0 -     HYDROcodone-Acetaminophen; Take 1 tablet by mouth every 6 (six) hours as needed for moderate pain (pain score 4-6).  Dispense: 60 tablet; Refill: 0 -     Meloxicam; Take 1 tablet (7.5 mg total) by mouth daily.  Dispense: 90 tablet; Refill: 0  Degeneration of intervertebral disc of lumbar region with discogenic back pain and lower extremity pain -     HYDROcodone-Acetaminophen; Take 1 tablet by mouth every 6 (six) hours as needed for moderate pain (pain score 4-6).  Dispense: 60 tablet; Refill: 0 -     HYDROcodone-Acetaminophen; Take 1 tablet by mouth every 6 (six) hours as needed for moderate pain (pain score 4-6).  Dispense: 60 tablet; Refill: 0 -     HYDROcodone-Acetaminophen; Take 1 tablet by mouth every 6 (six) hours as needed for moderate pain (pain score 4-6).  Dispense: 60 tablet; Refill: 0  Controlled substance agreement signed Assessment & Plan: UDS: 09/03/23. PDMP reviewed, no red flags. CSA is UTD. Refills provided.   Orders: -     HYDROcodone-Acetaminophen; Take 1 tablet by mouth every 6 (six) hours as needed for moderate  pain (pain score 4-6).  Dispense: 60 tablet; Refill: 0 -     HYDROcodone-Acetaminophen; Take 1 tablet by mouth every 6 (six) hours as needed for moderate pain (pain score 4-6).  Dispense: 60 tablet; Refill: 0 -     HYDROcodone-Acetaminophen; Take 1 tablet by mouth every 6 (six) hours as needed for moderate pain (pain score 4-6).  Dispense: 60 tablet; Refill: 0  Vertigo Assessment & Plan: Improving. Continue meclizine prn. Had CT to r/o CVA.     Follow-up: Return in about 3 months (around 03/07/2024) for CPE.   The patient indicates understanding of these issues and agrees with the plan.  Gabriel Earing, FNP

## 2023-12-08 NOTE — Assessment & Plan Note (Signed)
Well controlled with losartan, hydrochlorothiazide.

## 2023-12-08 NOTE — Assessment & Plan Note (Signed)
 Well controlled with supplementation

## 2023-12-12 DIAGNOSIS — H25813 Combined forms of age-related cataract, bilateral: Secondary | ICD-10-CM | POA: Diagnosis not present

## 2023-12-12 DIAGNOSIS — H5015 Alternating exotropia: Secondary | ICD-10-CM | POA: Diagnosis not present

## 2023-12-17 ENCOUNTER — Ambulatory Visit: Payer: 59 | Admitting: Family Medicine

## 2023-12-17 ENCOUNTER — Encounter: Payer: Self-pay | Admitting: Family Medicine

## 2023-12-17 VITALS — BP 98/67 | HR 107 | Temp 98.0°F | Ht 64.0 in | Wt 156.0 lb

## 2023-12-17 DIAGNOSIS — J0110 Acute frontal sinusitis, unspecified: Secondary | ICD-10-CM | POA: Diagnosis not present

## 2023-12-17 MED ORDER — AMOXICILLIN-POT CLAVULANATE 875-125 MG PO TABS: 1.0000 | ORAL_TABLET | Freq: Two times a day (BID) | ORAL | 0 refills | Status: AC

## 2023-12-17 NOTE — Progress Notes (Signed)
 Acute Office Visit  Subjective:     Patient ID: Kathy Barr, female    DOB: 1956/01/01, 68 y.o.   MRN: [id number]  Chief Complaint  Patient presents with   Cough    Cough This is a new problem. Episode onset: 8 days. The problem has been gradually worsening. The cough is Productive of sputum (white plegm). Associated symptoms include chest pain (lower rib pain), headaches, myalgias, nasal congestion and a sore throat. Pertinent negatives include no chills, ear congestion, ear pain, fever, shortness of breath or wheezing. There is no history of asthma, COPD or pneumonia.   Review of Systems  Constitutional:  Negative for chills and fever.  HENT:  Positive for sore throat. Negative for ear pain.   Respiratory:  Positive for cough. Negative for shortness of breath and wheezing.   Cardiovascular:  Positive for chest pain (lower rib pain).  Musculoskeletal:  Positive for myalgias.  Neurological:  Positive for headaches.        Objective:    BP 98/67   Pulse (!) 107   Temp 98 F (36.7 C) (Temporal)   Ht 5' 4 (1.626 m)   Wt 156 lb (70.8 kg)   SpO2 96%   BMI 26.78 kg/m    Physical Exam Vitals and nursing note reviewed.  Constitutional:      General: She is not in acute distress.    Appearance: She is ill-appearing. She is not toxic-appearing or diaphoretic.  HENT:     Head: Normocephalic and atraumatic.     Right Ear: Tympanic membrane, ear canal and external ear normal.     Left Ear: Tympanic membrane, ear canal and external ear normal.     Nose: Congestion present.     Right Sinus: Frontal sinus tenderness present.     Left Sinus: Frontal sinus tenderness present.     Mouth/Throat:     Mouth: Mucous membranes are moist.     Pharynx: Posterior oropharyngeal erythema present. No pharyngeal swelling, oropharyngeal exudate, uvula swelling or postnasal drip.     Tonsils: No tonsillar exudate. 1+ on the right. 1+ on the left.  Eyes:     Conjunctiva/sclera:  Conjunctivae normal.  Cardiovascular:     Rate and Rhythm: Regular rhythm. Tachycardia present.     Pulses: Normal pulses.     Heart sounds: Normal heart sounds. No murmur heard. Pulmonary:     Effort: Pulmonary effort is normal. No respiratory distress.     Breath sounds: Normal breath sounds. No wheezing.  Abdominal:     General: Bowel sounds are normal.     Palpations: Abdomen is soft.  Musculoskeletal:     Right lower leg: No edema.     Left lower leg: No edema.  Lymphadenopathy:     Cervical: No cervical adenopathy.  Skin:    General: Skin is warm and dry.  Neurological:     General: No focal deficit present.     Mental Status: She is alert and oriented to person, place, and time.  Psychiatric:        Mood and Affect: Mood normal.        Behavior: Behavior normal.     No results found for any visits on 12/17/23.      Assessment & Plan:   Kathy Barr was seen today for cough.  Diagnoses and all orders for this visit:  Acute non-recurrent frontal sinusitis Discussed symptomatic care, hydration, and return precautions.  -     amoxicillin -clavulanate (AUGMENTIN ) 875-125 MG  tablet; Take 1 tablet by mouth 2 (two) times daily for 7 days.  The patient indicates understanding of these issues and agrees with the plan.  Kathy CHRISTELLA Search, FNP

## 2024-01-07 ENCOUNTER — Encounter: Payer: 59 | Admitting: Orthopedic Surgery

## 2024-02-23 DIAGNOSIS — H01001 Unspecified blepharitis right upper eyelid: Secondary | ICD-10-CM | POA: Diagnosis not present

## 2024-02-23 DIAGNOSIS — H25813 Combined forms of age-related cataract, bilateral: Secondary | ICD-10-CM | POA: Diagnosis not present

## 2024-02-23 DIAGNOSIS — H01004 Unspecified blepharitis left upper eyelid: Secondary | ICD-10-CM | POA: Diagnosis not present

## 2024-02-23 DIAGNOSIS — H01002 Unspecified blepharitis right lower eyelid: Secondary | ICD-10-CM | POA: Diagnosis not present

## 2024-03-05 ENCOUNTER — Other Ambulatory Visit: Payer: Self-pay | Admitting: Family Medicine

## 2024-03-05 DIAGNOSIS — M159 Polyosteoarthritis, unspecified: Secondary | ICD-10-CM

## 2024-03-05 DIAGNOSIS — M51362 Other intervertebral disc degeneration, lumbar region with discogenic back pain and lower extremity pain: Secondary | ICD-10-CM

## 2024-03-05 DIAGNOSIS — Z79899 Other long term (current) drug therapy: Secondary | ICD-10-CM

## 2024-03-11 ENCOUNTER — Ambulatory Visit: Payer: 59 | Admitting: Family Medicine

## 2024-03-11 ENCOUNTER — Encounter: Payer: Self-pay | Admitting: Family Medicine

## 2024-03-11 VITALS — BP 102/64 | HR 100 | Temp 97.4°F | Ht 64.0 in | Wt 156.2 lb

## 2024-03-11 DIAGNOSIS — R300 Dysuria: Secondary | ICD-10-CM | POA: Diagnosis not present

## 2024-03-11 DIAGNOSIS — E782 Mixed hyperlipidemia: Secondary | ICD-10-CM | POA: Diagnosis not present

## 2024-03-11 DIAGNOSIS — M51362 Other intervertebral disc degeneration, lumbar region with discogenic back pain and lower extremity pain: Secondary | ICD-10-CM | POA: Diagnosis not present

## 2024-03-11 DIAGNOSIS — M159 Polyosteoarthritis, unspecified: Secondary | ICD-10-CM | POA: Diagnosis not present

## 2024-03-11 DIAGNOSIS — I10 Essential (primary) hypertension: Secondary | ICD-10-CM | POA: Diagnosis not present

## 2024-03-11 DIAGNOSIS — N3001 Acute cystitis with hematuria: Secondary | ICD-10-CM

## 2024-03-11 DIAGNOSIS — Z79899 Other long term (current) drug therapy: Secondary | ICD-10-CM | POA: Diagnosis not present

## 2024-03-11 LAB — MICROSCOPIC EXAMINATION
Epithelial Cells (non renal): NONE SEEN /HPF (ref 0–10)
RBC, Urine: >30 /HPF — AB (ref 0–2)
Renal Epithel, UA: NONE SEEN /HPF
WBC, UA: >30 /HPF — AB (ref 0–5)

## 2024-03-11 LAB — URINALYSIS, ROUTINE W REFLEX MICROSCOPIC
Bilirubin, UA: NEGATIVE
Glucose, UA: NEGATIVE
Nitrite, UA: POSITIVE — AB
Specific Gravity, UA: 1.02 (ref 1.005–1.030)
Urobilinogen, Ur: 2 mg/dL — ABNORMAL HIGH (ref 0.2–1.0)
pH, UA: 7 (ref 5.0–7.5)

## 2024-03-11 LAB — WET PREP FOR TRICH, YEAST, CLUE
Clue Cell Exam: NEGATIVE
Trichomonas Exam: NEGATIVE
Yeast Exam: NEGATIVE

## 2024-03-11 MED ORDER — HYDROCODONE-ACETAMINOPHEN 5-325 MG PO TABS: 1.0000 | ORAL_TABLET | Freq: Four times a day (QID) | ORAL | 0 refills | Status: DC | PRN

## 2024-03-11 MED ORDER — CEPHALEXIN 500 MG PO CAPS: 500.0000 mg | ORAL_CAPSULE | Freq: Two times a day (BID) | ORAL | 0 refills | Status: AC

## 2024-03-11 NOTE — Progress Notes (Signed)
 Established Patient Office Visit  Subjective:  Patient ID: Kathy Barr, female    DOB: [birth year]  Age: 68 y.o. MRN: [id number]  CC:  Chief Complaint  Patient presents with   Annual Exam    HPI Kathy Barr presents for chronic follow up.  HTN Complaint with meds - Yes Current Medications - losartan and HCTZ  Checking BP at home ranging- not checking at home Exercising Regularly - stays active during the day caring for foster child and acting as caregiver for her mother Watching Salt intake - Yes Pertinent ROS:  Headache - No Fatigue - No Visual Disturbances - No Chest pain - No Dyspnea - No Palpitations - No LE edema - No  2. HLD Regular diet, no structured exercise.   3. Hypokalemia She is compliant with potassium supplement.   4. Generalized OA DDD Pain assessment: Cause of pain- generalized OA Pain location- all joints, mostly shoulder, hips, back, knees, hands Pain on scale of 1-10:  8 She takes her pain medication 2x a day some days and not all other days. She also takes meloxicam, biofeeze, and plain tylenol prn. She also uses topical pain ointments. She has tried PT multiple times. She has had numerous joint injections. She also uses a Scientist, clinical (histocompatibility and immunogenetics). She has started using a cane prn. Will be having knee replacement soon. She last took her opiate 5-6 days ago. Rx ran out about 1 week ago. Last fill 02/05/23. Frequency- daily What increases pain- activity, bending, twisting What makes pain Better-rest, pain medicine improves pain to 6/10 Effects on ADL - difficulty doing housework,  Any change in general medical condition-no  Current opioids rx- Norco q 6 hours prn # meds rx- 60 tablets monthly Effectiveness of current meds- brings pain down to a 2/10 Adverse reactions from pain meds- denies Morphine equivalent- 40 mme per Rx Pill count performed-No Last drug screen - 02/15/22 ( high risk q31m, moderate risk q55m, low risk yearly ) Urine drug screen today-  No Was the NCCSR reviewed- yes  If yes were their any concerning findings? - no  Overdose risk: 140  Pain contract signed on: 06/11/23   6. Dysuria Dysuria x 2-3 days. Unchanged. Some frequency, urgency. No fever, flank pain, hematuria.   Past Medical History:  Diagnosis Date   Arthritis    "all over" (03/18/2013)   Carpal tunnel syndrome    "right" (03/18/2013)   Fracture 2013   RLE   GERD (gastroesophageal reflux disease)    OTC   Hypertension    IBS (irritable bowel syndrome)    Osteoarthritis of left hip    Osteopenia 05/28/2021   Seizures (HCC) 1960's; 1978   "when I was little; when I was 7 months pregnant" (03/18/2013)   Shortness of breath    "not since I quit smoking" (03/18/2013)    Past Surgical History:  Procedure Laterality Date   ANTERIOR CERVICAL DECOMP/DISCECTOMY FUSION  ?2003   ANTERIOR CERVICAL DECOMP/DISCECTOMY FUSION N/A 04/24/2018   Procedure: C6-7 PLATE REMOVAL, G3-8, C5-6 ANTERIOR CERVICAL DECOMPRESSION/DISCECTOMY & FUSION, ALLOGRAFT, PLATE;  Surgeon: Eldred Manges, MD;  Location: [facility] OR;  Service: Orthopedics;  Laterality: N/A;   CHOLECYSTECTOMY  2003   COLONOSCOPY N/A 02/17/2014   Procedure: COLONOSCOPY;  Surgeon: Malissa Hippo, MD;  Location: [facility];  Service: Endoscopy;  Laterality: N/A;  145-moved to 10:30 Ann to notify pt   COLONOSCOPY N/A 08/25/2019   Procedure: COLONOSCOPY;  Surgeon: Malissa Hippo, MD;  Location: [facility];  Service: Endoscopy;  Laterality: N/A;  1030   FOOT ARTHROTOMY Right 1990's   pin, after removing a bone    JOINT REPLACEMENT Left    hip and right   pinched nerve     POLYPECTOMY  08/25/2019   Procedure: POLYPECTOMY;  Surgeon: Malissa Hippo, MD;  Location: [facility];  Service: Endoscopy;;   TOTAL HIP ARTHROPLASTY  06/30/2012   Procedure: TOTAL HIP ARTHROPLASTY;  Surgeon: Kerrin Champagne, MD;  Location: [facility] OR;  Service: Orthopedics;  Laterality: Left;  Left total hip replacement with metal and polypropylene  pore coated implants   TOTAL HIP ARTHROPLASTY Right 03/16/2013   Procedure: RIGHT TOTAL HIP ARTHROPLASTY- right ;  Surgeon: Kerrin Champagne, MD;  Location: [facility] OR;  Service: Orthopedics;  Laterality: Right;   TUBAL LIGATION  1978   VAGINAL HYSTERECTOMY  ~ March 22, 2002    Family History  Problem Relation Age of Onset   Hypertension Father    Diabetes Father    Hypertension Sister    Diabetes Sister    Hypertension Brother    Diabetes Brother    Diabetes Mother    Hypertension Mother    Hyperlipidemia Mother    COPD Brother    Heart disease Brother    Hypertension Brother     Social History   Socioeconomic History   Marital status: Divorced    Spouse name: Not on file   Number of children: 1   Years of education: 9   Highest education level: 9th grade  Occupational History    Employer: DISABLED  Tobacco Use   Smoking status: Former    Current packs/day: 0.25    Average packs/day: 0.3 packs/day for 25.0 years (6.3 ttl pk-yrs)    Types: Cigarettes   Smokeless tobacco: Never   Tobacco comments:    Light smoker - has tried to quit, but always starts back  Vaping Use   Vaping status: Never Used  Substance and Sexual Activity   Alcohol use: No    Alcohol/week: 0.0 standard drinks of alcohol   Drug use: No   Sexual activity: Yes  Other Topics Concern   Not on file  Social History Narrative   Her significant other of 15 years passed away in 2020/03/22   06/06/22 - She recently adopted a one year old. (From her ex-fiance's grandchild who's parents didn't take care of her well and she was taken by social services)   Daughter and mother live nearby   Social Drivers of Health   Financial Resource Strain: Low Risk  (08/01/2023)   Overall Financial Resource Strain (CARDIA)    Difficulty of Paying Living Expenses: Not hard at all  Food Insecurity: No Food Insecurity (08/01/2023)   Hunger Vital Sign    Worried About Running Out of Food in the Last Year: Never true    Ran Out of Food in the  Last Year: Never true  Transportation Needs: No Transportation Needs (08/01/2023)   PRAPARE - Administrator, Civil Service (Medical): No    Lack of Transportation (Non-Medical): No  Physical Activity: Insufficiently Active (08/01/2023)   Exercise Vital Sign    Days of Exercise per Week: 3 days    Minutes of Exercise per Session: 30 min  Stress: No Stress Concern Present (08/01/2023)   [employer] of Occupational Health - Occupational Stress Questionnaire    Feeling of Stress : Not at all  Social Connections: Socially Isolated (08/01/2023)   Social Connection and Isolation  Panel [NHANES]    Frequency of Communication with Friends and Family: More than three times a week    Frequency of Social Gatherings with Friends and Family: Once a week    Attends Religious Services: Never    Database administrator or Organizations: No    Attends Banker Meetings: Never    Marital Status: Divorced  Catering manager Violence: Not At Risk (08/01/2023)   Humiliation, Afraid, Rape, and Kick questionnaire    Fear of Current or Ex-Partner: No    Emotionally Abused: No    Physically Abused: No    Sexually Abused: No    Outpatient Medications Prior to Visit  Medication Sig Dispense Refill   albuterol (VENTOLIN HFA) 108 (90 Base) MCG/ACT inhaler INHALE 2 PUFFS INTO LUNGS EVERY 6 HOURS AS NEEDED FOR WHEEZING OR SHORTNESS OF BREATH 18 g 2   Calcium Carb-Cholecalciferol (CALCIUM 600+D3 PO) Take 1 tablet by mouth 2 (two) times daily.     hydrochlorothiazide (HYDRODIURIL) 25 MG tablet Take 1 tablet (25 mg total) by mouth daily. 90 tablet 3   loratadine (CLARITIN) 10 MG tablet Take 10 mg by mouth daily.     losartan (COZAAR) 50 MG tablet Take 1 tablet (50 mg total) by mouth daily. 90 tablet 3   meclizine (ANTIVERT) 12.5 MG tablet Take 1 tablet (12.5 mg total) by mouth 3 (three) times daily as needed for dizziness. 30 tablet 0   meloxicam (MOBIC) 7.5 MG tablet Take 1 tablet (7.5 mg  total) by mouth daily. 90 tablet 0   naloxone (NARCAN) nasal spray 4 mg/0.1 mL Nasally for overdose 1 each 3   potassium chloride (KLOR-CON) 10 MEQ tablet Take 1 tablet by mouth once daily 90 tablet 3   No facility-administered medications prior to visit.    No Known Allergies  ROS Review of Systems As per HPI.    Objective:    Physical Exam Vitals and nursing note reviewed.  Constitutional:      General: She is not in acute distress.    Appearance: Normal appearance. She is not ill-appearing, toxic-appearing or diaphoretic.  HENT:     Head: Normocephalic and atraumatic.     Mouth/Throat:     Mouth: Mucous membranes are moist.     Pharynx: Oropharynx is clear.  Eyes:     Extraocular Movements: Extraocular movements intact.     Conjunctiva/sclera: Conjunctivae normal.     Pupils: Pupils are equal, round, and reactive to light.  Cardiovascular:     Rate and Rhythm: Normal rate and regular rhythm.     Pulses: Normal pulses.     Heart sounds: Normal heart sounds. No murmur heard. Pulmonary:     Effort: Pulmonary effort is normal. No respiratory distress.     Breath sounds: Normal breath sounds.  Musculoskeletal:        General: No swelling.     Right lower leg: No edema.     Left lower leg: No edema.  Skin:    General: Skin is warm and dry.  Neurological:     General: No focal deficit present.     Mental Status: She is alert and oriented to person, place, and time.  Psychiatric:        Barr and Affect: Barr normal.        Behavior: Behavior normal.        Thought Content: Thought content normal.        Judgment: Judgment normal.     BP 102/64  Pulse 100   Temp (!) 97.4 F (36.3 C) (Temporal)   Ht 5\' 4"  (1.626 m)   Wt 156 lb 3.2 oz (70.9 kg)   SpO2 97%   BMI 26.81 kg/m  Wt Readings from Last 3 Encounters:  03/11/24 156 lb 3.2 oz (70.9 kg)  12/17/23 156 lb (70.8 kg)  12/08/23 161 lb 3.2 oz (73.1 kg)   Urine dipstick shows positive for RBC's, positive for  nitrates, and positive for leukocytes.  Micro exam: >30 WBC's per HPF, >30 RBC's per HPF, and many + bacteria.  Microscopic wet-mount exam shows negative for pathogens, normal epithelial cells.    Assessment & Plan:   Kathy Barr was seen today for annual exam.  Diagnoses and all orders for this visit:  Primary hypertension Well controlled on current regimen.  -     CBC with Differential/Platelet -     CMP14+EGFR -     TSH  Mixed hyperlipidemia -     Lipid panel  Generalized OA Degeneration of intervertebral disc of lumbar region with discogenic back pain and lower extremity pain Controlled substance agreement signed Continue current regimen. PDMP reviewed, no red flags. Will update UDS at next appt as she has been out of medication x 1 week. Last fill was 02/06/24 for 60 tablets. CSA is UTD. Continue follow up with ortho.  -     HYDROcodone-acetaminophen (NORCO/VICODIN) 5-325 MG tablet; Take 1 tablet by mouth every 6 (six) hours as needed for moderate pain (pain score 4-6). -     HYDROcodone-acetaminophen (NORCO/VICODIN) 5-325 MG tablet; Take 1 tablet by mouth every 6 (six) hours as needed for moderate pain (pain score 4-6). -     HYDROcodone-acetaminophen (NORCO/VICODIN) 5-325 MG tablet; Take 1 tablet by mouth every 6 (six) hours as needed for moderate pain (pain score 4-6).  Acute cystitis with hematuria Keflex as below pending culture. Return to office for new or worsening symptoms, or if symptoms persist.  -     Urinalysis, Routine w reflex microscopic -     WET PREP FOR TRICH, YEAST, CLUE -     cephALEXin (KEFLEX) 500 MG capsule; Take 1 capsule (500 mg total) by mouth 2 (two) times daily for 7 days. -     Urine Culture    Follow-up: Return in about 10 weeks (around 05/20/2024) for chronic follow up. Repeat UDS at next visit.    The patient indicates understanding of these issues and agrees with the plan.  Gabriel Earing, FNP

## 2024-03-12 LAB — CBC WITH DIFFERENTIAL/PLATELET
Basophils Absolute: 0 10*3/uL (ref 0.0–0.2)
Basos: 1 %
EOS (ABSOLUTE): 0.1 10*3/uL (ref 0.0–0.4)
Eos: 2 %
Hematocrit: 39.8 % (ref 34.0–46.6)
Hemoglobin: 13.3 g/dL (ref 11.1–15.9)
Immature Grans (Abs): 0 10*3/uL (ref 0.0–0.1)
Immature Granulocytes: 0 %
Lymphocytes Absolute: 1.5 10*3/uL (ref 0.7–3.1)
Lymphs: 24 %
MCH: 28.6 pg (ref 26.6–33.0)
MCHC: 33.4 g/dL (ref 31.5–35.7)
MCV: 86 fL (ref 79–97)
Monocytes Absolute: 0.5 10*3/uL (ref 0.1–0.9)
Monocytes: 8 %
Neutrophils Absolute: 4 10*3/uL (ref 1.4–7.0)
Neutrophils: 65 %
Platelets: 244 10*3/uL (ref 150–450)
RBC: 4.65 x10E6/uL (ref 3.77–5.28)
RDW: 13 % (ref 11.7–15.4)
WBC: 6.2 10*3/uL (ref 3.4–10.8)

## 2024-03-12 LAB — CMP14+EGFR
ALT: 8 IU/L (ref 0–32)
AST: 17 IU/L (ref 0–40)
Albumin: 4.2 g/dL (ref 3.9–4.9)
Alkaline Phosphatase: 89 IU/L (ref 44–121)
BUN/Creatinine Ratio: 14 (ref 12–28)
BUN: 11 mg/dL (ref 8–27)
Bilirubin Total: 0.9 mg/dL (ref 0.0–1.2)
CO2: 27 mmol/L (ref 20–29)
Calcium: 9.7 mg/dL (ref 8.7–10.3)
Chloride: 101 mmol/L (ref 96–106)
Creatinine, Ser: 0.81 mg/dL (ref 0.57–1.00)
Globulin, Total: 2.8 g/dL (ref 1.5–4.5)
Glucose: 107 mg/dL — ABNORMAL HIGH (ref 70–99)
Potassium: 4 mmol/L (ref 3.5–5.2)
Sodium: 144 mmol/L (ref 134–144)
Total Protein: 7 g/dL (ref 6.0–8.5)
eGFR: 79 mL/min/{1.73_m2} (ref >59)

## 2024-03-12 LAB — LIPID PANEL
Chol/HDL Ratio: 3 ratio (ref 0.0–4.4)
Cholesterol, Total: 167 mg/dL (ref 100–199)
HDL: 55 mg/dL (ref >39)
LDL Chol Calc (NIH): 84 mg/dL (ref 0–99)
Triglycerides: 164 mg/dL — ABNORMAL HIGH (ref 0–149)
VLDL Cholesterol Cal: 28 mg/dL (ref 5–40)

## 2024-03-12 LAB — TSH: TSH: 1.66 u[IU]/mL (ref 0.450–4.500)

## 2024-03-14 LAB — URINE CULTURE

## 2024-03-19 ENCOUNTER — Other Ambulatory Visit: Payer: Self-pay | Admitting: Family Medicine

## 2024-03-19 DIAGNOSIS — M159 Polyosteoarthritis, unspecified: Secondary | ICD-10-CM

## 2024-03-30 ENCOUNTER — Other Ambulatory Visit: Payer: Self-pay | Admitting: Family Medicine

## 2024-03-30 DIAGNOSIS — I10 Essential (primary) hypertension: Secondary | ICD-10-CM

## 2024-04-05 DIAGNOSIS — H25812 Combined forms of age-related cataract, left eye: Secondary | ICD-10-CM | POA: Diagnosis not present

## 2024-04-06 NOTE — H&P (Signed)
 Surgical History & Physical  Patient Name: Kathy Barr  DOB: [birth year]  Surgery: Cataract extraction with intraocular lens implant phacoemulsification; Left Eye Surgeon: Tarri Farm MD Surgery Date: 04/09/2024 Pre-Op Date: 02/23/2024  HPI: A 64 Yr. old female patient 1. The patient is here for cataract eval. Pt. complains of difficulty when driving at night. Both eyes are affected. The episode is constant. Symptoms occur when the patient is driving and reading. The complaint is associated with blurry vision. This is negatively affecting the patient's quality of life and the patient is unable to function adequately in life with the current level of vision. HPI was performed by Tarri Farm .  Medical History: Cataracts  High Blood Pressure  Review of Systems Cardiovascular High Blood Pressure  Social Former smoker   Medication Hydrocodone -acetaminophen , Meloxicam , Hydrochlorothiazide , Losartan , Amoxicillin -pot clavulanate, Potassium chloride , Prednisolone acetate, Ilevro, Moxifloxacin  Sx/Procedures Neck surgery, Hip Replacement  Drug Allergies  NKDA  History & Physical: Heent: cataracts NECK: supple without bruits LUNGS: lungs clear to auscultation CV: regular rate and rhythm Abdomen: soft and non-tender  Impression & Plan: Assessment: 1.  COMBINED FORMS AGE RELATED CATARACT; Both Eyes (H25.813) 2.  BLEPHARITIS; Right Upper Lid, Right Lower Lid, Left Upper Lid, Left Lower Lid (H01.001, H01.002,H01.004,H01.005) 3.  DERMATOCHALASIS, no surgery; Right Upper Lid, Left Upper Lid (H02.831, H02.834) 4.  Pinguecula; Right Eye (H11.151) 5.  ASTIGMATISM, REGULAR; Right Eye (H52.221)  Plan: 1.  Cataract accounts for the patient's decreased vision. This visual impairment is not correctable with a tolerable change in glasses or contact lenses. Cataract surgery with an implantation of a new lens should significantly improve the visual and functional status of the patient. Discussed  all risks, benefits, alternatives, and potential complications. Discussed the procedures and recovery. Patient desires to have surgery. A-scan ordered and performed today for intra-ocular lens calculations. The surgery will be performed in order to improve vision for driving, reading, and for eye examinations. Recommend phacoemulsification with intra-ocular lens. Recommend Dextenza  for post-operative pain and inflammation. Left Eye worse - first. Dilates poorly - shugarcaine by protocol. Malyugin Ring. Omidira.  2.  Blepharitis is present - recommend regular lid cleaning.  3.  Asymptomatic, recommend observation for now. Findings, prognosis and treatment options reviewed.  4.  Observe; Artificial tears as needed for irritation.  5.  mild to moderate

## 2024-04-07 ENCOUNTER — Encounter (HOSPITAL_COMMUNITY)
Admission: RE | Admit: 2024-04-07 | Discharge: 2024-04-07 | Disposition: A | Discharge status: Home / Self Care | Source: Ambulatory Visit | Attending: Ophthalmology | Admitting: Ophthalmology

## 2024-04-09 ENCOUNTER — Ambulatory Visit (HOSPITAL_BASED_OUTPATIENT_CLINIC_OR_DEPARTMENT_OTHER): Admitting: Anesthesiology

## 2024-04-09 ENCOUNTER — Other Ambulatory Visit: Payer: Self-pay

## 2024-04-09 ENCOUNTER — Ambulatory Visit (HOSPITAL_COMMUNITY)
Admission: RE | Admit: 2024-04-09 | Discharge: 2024-04-09 | Disposition: A | Discharge status: Home / Self Care | Attending: Ophthalmology | Admitting: Ophthalmology

## 2024-04-09 ENCOUNTER — Encounter (HOSPITAL_COMMUNITY): Payer: Self-pay | Admitting: Ophthalmology

## 2024-04-09 ENCOUNTER — Ambulatory Visit (HOSPITAL_COMMUNITY): Admitting: Anesthesiology

## 2024-04-09 ENCOUNTER — Encounter (HOSPITAL_COMMUNITY)
Admission: RE | Disposition: A | Discharge status: Home / Self Care | Payer: Self-pay | Source: Home / Self Care | Attending: Ophthalmology

## 2024-04-09 DIAGNOSIS — J45909 Unspecified asthma, uncomplicated: Secondary | ICD-10-CM | POA: Diagnosis not present

## 2024-04-09 DIAGNOSIS — I10 Essential (primary) hypertension: Secondary | ICD-10-CM | POA: Diagnosis not present

## 2024-04-09 DIAGNOSIS — H25812 Combined forms of age-related cataract, left eye: Secondary | ICD-10-CM

## 2024-04-09 DIAGNOSIS — Z87891 Personal history of nicotine dependence: Secondary | ICD-10-CM | POA: Diagnosis not present

## 2024-04-09 HISTORY — PX: CATARACT EXTRACTION W/PHACO: SHX586

## 2024-04-09 SURGERY — PHACOEMULSIFICATION, CATARACT, WITH IOL INSERTION
Anesthesia: Monitor Anesthesia Care | Site: Eye | Laterality: Left

## 2024-04-09 MED ORDER — LIDOCAINE HCL (PF) 1 % IJ SOLN
INTRAOCULAR | Status: DC | PRN
  Administered 2024-04-09: 1 mL via OPHTHALMIC

## 2024-04-09 MED ORDER — SODIUM HYALURONATE 10 MG/ML IO SOLUTION
PREFILLED_SYRINGE | INTRAOCULAR | Status: DC | PRN
  Administered 2024-04-09: .85 mL via INTRAOCULAR

## 2024-04-09 MED ORDER — MOXIFLOXACIN HCL 5 MG/ML IO SOLN
INTRAOCULAR | Status: DC | PRN
  Administered 2024-04-09: .2 mL via OPHTHALMIC

## 2024-04-09 MED ORDER — PHENYLEPHRINE HCL 2.5 % OP SOLN
1.0000 [drp] | OPHTHALMIC | Status: AC | PRN
  Administered 2024-04-09 (×3): 1 [drp] via OPHTHALMIC

## 2024-04-09 MED ORDER — MIDAZOLAM HCL 2 MG/2ML IJ SOLN
INTRAMUSCULAR | Status: DC | PRN
  Administered 2024-04-09: 1 mg via INTRAVENOUS

## 2024-04-09 MED ORDER — STERILE WATER FOR IRRIGATION IR SOLN
Status: DC | PRN
  Administered 2024-04-09: 250 mL

## 2024-04-09 MED ORDER — POVIDONE-IODINE 5 % OP SOLN
OPHTHALMIC | Status: DC | PRN
  Administered 2024-04-09: 1 via OPHTHALMIC

## 2024-04-09 MED ORDER — TETRACAINE HCL 0.5 % OP SOLN
1.0000 [drp] | OPHTHALMIC | Status: AC | PRN
  Administered 2024-04-09 (×3): 1 [drp] via OPHTHALMIC

## 2024-04-09 MED ORDER — BSS IO SOLN
INTRAOCULAR | Status: DC | PRN
  Administered 2024-04-09: 15 mL via INTRAOCULAR

## 2024-04-09 MED ORDER — LIDOCAINE HCL 3.5 % OP GEL
1.0000 | Freq: Once | OPHTHALMIC | Status: AC
  Administered 2024-04-09: 1 via OPHTHALMIC

## 2024-04-09 MED ORDER — EPINEPHRINE PF 1 MG/ML IJ SOLN
INTRAOCULAR | Status: DC | PRN
  Administered 2024-04-09: 500 mL

## 2024-04-09 MED ORDER — SODIUM CHLORIDE 0.9% FLUSH
INTRAVENOUS | Status: DC | PRN
  Administered 2024-04-09: 7 mL via INTRAVENOUS

## 2024-04-09 MED ORDER — MIDAZOLAM HCL 2 MG/2ML IJ SOLN
INTRAMUSCULAR | Status: AC
  Filled 2024-04-09: qty 2

## 2024-04-09 MED ORDER — TROPICAMIDE 1 % OP SOLN
1.0000 [drp] | OPHTHALMIC | Status: AC | PRN
  Administered 2024-04-09 (×3): 1 [drp] via OPHTHALMIC

## 2024-04-09 MED ORDER — SODIUM HYALURONATE 23MG/ML IO SOSY
PREFILLED_SYRINGE | INTRAOCULAR | Status: DC | PRN
  Administered 2024-04-09: .6 mL via INTRAOCULAR

## 2024-04-09 SURGICAL SUPPLY — 12 items
CATARACT SUITE SIGHTPATH (MISCELLANEOUS) ×1 IMPLANT
CLOTH BEACON ORANGE TIMEOUT ST (SAFETY) ×1 IMPLANT
EYE SHIELD UNIVERSAL CLEAR (GAUZE/BANDAGES/DRESSINGS) IMPLANT
FEE CATARACT SUITE SIGHTPATH (MISCELLANEOUS) ×1 IMPLANT
GLOVE BIOGEL PI IND STRL 7.0 (GLOVE) ×2 IMPLANT
LENS IOL TECNIS EYHANCE 23.0 (Intraocular Lens) IMPLANT
NDL HYPO 18GX1.5 BLUNT FILL (NEEDLE) ×1 IMPLANT
NEEDLE HYPO 18GX1.5 BLUNT FILL (NEEDLE) ×1 IMPLANT
PAD ARMBOARD POSITIONER FOAM (MISCELLANEOUS) ×1 IMPLANT
SYR TB 1ML LL NO SAFETY (SYRINGE) ×1 IMPLANT
TAPE SURG TRANSPORE 1 IN (GAUZE/BANDAGES/DRESSINGS) IMPLANT
WATER STERILE IRR 250ML POUR (IV SOLUTION) ×1 IMPLANT

## 2024-04-09 NOTE — Op Note (Signed)
 Date of procedure: 04/09/24  Pre-operative diagnosis: Visually significant age-related combined cataract, Left Eye (H25.812)  Post-operative diagnosis: Visually significant age-related combined cataract, Left Eye (H25.812)  Procedure: Removal of cataract via phacoemulsification and insertion of intra-ocular lens [employer] DIB00 +23.0D into the capsular bag of the Left Eye  Attending surgeon: Pleas Brill. Eugene Isadore, MD, MA  Anesthesia: MAC, Topical Akten  Complications: None  Estimated Blood Loss: <64mL (minimal)  Specimens: None  Implants: As above  Indications:  Visually significant age-related cataract, Left Eye  Procedure:  The patient was seen and identified in the pre-operative area. The operative eye was identified and dilated.  The operative eye was marked.  Topical anesthesia was administered to the operative eye.     The patient was then to the operative suite and placed in the supine position.  A timeout was performed confirming the patient, procedure to be performed, and all other relevant information.   The patient's face was prepped and draped in the usual fashion for intra-ocular surgery.  A lid speculum was placed into the operative eye and the surgical microscope moved into place and focused.  An inferotemporal paracentesis was created using a 20 gauge paracentesis blade.  Shugarcaine was injected into the anterior chamber.  Viscoelastic was injected into the anterior chamber.  A temporal clear-corneal main wound incision was created using a 2.63mm microkeratome.  A continuous curvilinear capsulorrhexis was initiated using an irrigating cystitome and completed using capsulorrhexis forceps.  Hydrodissection and hydrodeliniation were performed.  Viscoelastic was injected into the anterior chamber.  A phacoemulsification handpiece and a chopper as a second instrument were used to remove the nucleus and epinucleus. The irrigation/aspiration handpiece was used to remove any  remaining cortical material.   The capsular bag was reinflated with viscoelastic, checked, and found to be intact.  The intraocular lens was inserted into the capsular bag.  The irrigation/aspiration handpiece was used to remove any remaining viscoelastic.  The clear corneal wound and paracentesis wounds were then hydrated and checked with Weck-Cels to be watertight. 0.1mL of Moxfloxacin was injected into the anterior chamber. The lid-speculum was removed.  The drape was removed.  The patient's face was cleaned with a wet and dry 4x4.    A clear shield was taped over the eye. The patient was taken to the post-operative care unit in good condition, having tolerated the procedure well.  Post-Op Instructions: The patient will follow up at [facility] for a same day post-operative evaluation and will receive all other orders and instructions.

## 2024-04-09 NOTE — Anesthesia Postprocedure Evaluation (Signed)
 Anesthesia Post Note  Patient: Kathy Barr  Procedure(s) Performed: PHACOEMULSIFICATION, CATARACT, WITH IOL INSERTION (Left: Eye)  Patient location during evaluation: Phase II Anesthesia Type: MAC Level of consciousness: awake and alert Pain management: pain level controlled Vital Signs Assessment: post-procedure vital signs reviewed and stable Respiratory status: spontaneous breathing, nonlabored ventilation and respiratory function stable Cardiovascular status: stable and blood pressure returned to baseline Postop Assessment: no apparent nausea or vomiting Anesthetic complications: no   There were no known notable events for this encounter.   Last Vitals:  Vitals:   04/09/24 1026 04/09/24 1146  BP: 119/72 (!) 144/124  Pulse: 82 77  Resp: 20 17  Temp: 36.6 C (!) 36.3 C  SpO2: 100% 100%    Last Pain:  Vitals:   04/09/24 1146  TempSrc: Oral  PainSc: 0-No pain                 Hayze Gazda L Yusuke Beza

## 2024-04-09 NOTE — Discharge Instructions (Addendum)
 Please discharge patient when stable, will follow up today with Dr. June Leap at the [facility] office immediately following discharge.  Leave shield in place until visit.  All paperwork with discharge instructions will be given at the office.  [facility] Address:  [address]  [place], [place] [address]

## 2024-04-09 NOTE — Anesthesia Procedure Notes (Signed)
 Procedure Name: MAC Date/Time: 04/09/2024 11:24 AM  Performed by: Sherwin Donate, CRNAPre-anesthesia Checklist: Patient identified, Emergency Drugs available, Suction available and Patient being monitored Patient Re-evaluated:Patient Re-evaluated prior to induction Oxygen Delivery Method: Nasal cannula Placement Confirmation: positive ETCO2

## 2024-04-09 NOTE — Transfer of Care (Signed)
 Immediate Anesthesia Transfer of Care Note  Patient: Kathy Barr  Procedure(s) Performed: PHACOEMULSIFICATION, CATARACT, WITH IOL INSERTION (Left: Eye)  Patient Location: Short Stay  Anesthesia Type:MAC  Level of Consciousness: awake, alert , and oriented  Airway & Oxygen Therapy: Patient Spontanous Breathing  Post-op Assessment: Report given to RN and Post -op Vital signs reviewed and stable  Post vital signs: Reviewed and stable  Last Vitals:  Vitals Value Taken Time  BP    Temp    Pulse    Resp    SpO2      Last Pain:  Vitals:   04/09/24 1026  TempSrc: Oral  PainSc: 0-No pain         Complications: No notable events documented.

## 2024-04-09 NOTE — Interval H&P Note (Signed)
 History and Physical Interval Note:  04/09/2024 11:18 AM  Kathy Barr  has presented today for surgery, with the diagnosis of combined forms age related cataract, left eye.  The various methods of treatment have been discussed with the patient and family. After consideration of risks, benefits and other options for treatment, the patient has consented to  Procedure(s): PHACOEMULSIFICATION, CATARACT, WITH IOL INSERTION (Left) as a surgical intervention.  The patient's history has been reviewed, patient examined, no change in status, stable for surgery.  I have reviewed the patient's chart and labs.  Questions were answered to the patient's satisfaction.     Tarri Farm

## 2024-04-09 NOTE — Anesthesia Preprocedure Evaluation (Signed)
 Anesthesia Evaluation  Patient identified by MRN, date of birth, ID band Patient awake    Reviewed: Allergy & Precautions, H&P , NPO status , Patient's Chart, lab work & pertinent test results, reviewed documented beta blocker date and time   Airway Mallampati: II  TM Distance: >3 FB Neck ROM: full    Dental no notable dental hx. (+) Dental Advisory Given, Teeth Intact   Pulmonary shortness of breath, asthma , former smoker   Pulmonary exam normal breath sounds clear to auscultation       Cardiovascular Exercise Tolerance: Good hypertension, Normal cardiovascular exam Rhythm:regular Rate:Normal     Neuro/Psych Seizures -,   Neuromuscular disease  negative psych ROS   GI/Hepatic Neg liver ROS,GERD  ,,IBS   Endo/Other  negative endocrine ROS    Renal/GU negative Renal ROS  negative genitourinary   Musculoskeletal  (+) Arthritis , Osteoarthritis,    Abdominal   Peds  Hematology negative hematology ROS (+)   Anesthesia Other Findings   Reproductive/Obstetrics negative OB ROS                             Anesthesia Physical Anesthesia Plan  ASA: 2  Anesthesia Plan: MAC   Post-op Pain Management: Minimal or no pain anticipated   Induction:   PONV Risk Score and Plan:   Airway Management Planned: Natural Airway and Nasal Cannula  Additional Equipment: None  Intra-op Plan:   Post-operative Plan:   Informed Consent: I have reviewed the patients History and Physical, chart, labs and discussed the procedure including the risks, benefits and alternatives for the proposed anesthesia with the patient or authorized representative who has indicated his/her understanding and acceptance.     Dental Advisory Given  Plan Discussed with: CRNA  Anesthesia Plan Comments:        Anesthesia Quick Evaluation

## 2024-04-12 ENCOUNTER — Encounter (HOSPITAL_COMMUNITY): Payer: Self-pay | Admitting: Ophthalmology

## 2024-04-16 ENCOUNTER — Encounter (HOSPITAL_COMMUNITY): Admission: RE | Admit: 2024-04-16 | Source: Ambulatory Visit

## 2024-04-16 NOTE — H&P (Signed)
 Surgical History & Physical  Patient Name: Kathy Barr  DOB: 03-12-1956 Surgery: Cataract extraction with intraocular lens implant phacoemulsification; Left Eye Surgeon: Tarri Farm MD Surgery Date: 04/23/2024 Pre-Op Date: 04/15/2024  HPI: A 78 Yr. old female patient 1. The patient is returning after cataract surgery. The left eye is affected. Status post cataract surgery, which began 6 days ago: Since the last visit, the affected area feels improvement. The patient's vision is improved. The condition's severity is constant. Patient is following medication instructions. 2. The patient is returning for a cataract follow-up of the right eye. Since the last visit, the affected area is tolerating. The patient's vision is blurry. The condition's severity is constant. Patient is not taking medications. This is negatively affecting the patient's quality of life and the patient is unable to function adequately in life with the current level of vision. HPI Completed by Dr. Tarri Farm  Medical History: Cataracts  High Blood Pressure  Review of Systems Cardiovascular High Blood Pressure  Social Former smoker   Medication Prednisolone acetate, Ilevro, Moxifloxacin ,  Hydrocodone -acetaminophen , Meloxicam , Hydrochlorothiazide , Losartan , Amoxicillin -pot clavulanate, Potassium chloride   Sx/Procedures Phaco c IOL OS,  Neck surgery, Hip Replacement  Drug Allergies  NKDA  History & Physical: Heent: cataract NECK: supple without bruits LUNGS: lungs clear to auscultation CV: regular rate and rhythm Abdomen: soft and non-tender  Impression & Plan: Assessment: 1.  LENS FRAGMENTS IN EYE FOLLOWING CATARACT SURGERY; Left Eye (H59.022) 2.  CATARACT EXTRACTION STATUS; Left Eye (Z98.42) 3.  COMBINED FORMS AGE RELATED CATARACT; Right Eye (H25.811)  Plan: 1.  Found at post-op exam. Recommend immediate removal. Risks, benefits, alternatives, and complications discussed.  2.  1 week after cataract  surgery. Doing well with improved vision and normal eye pressure. Call with any problems or concerns. Stop Vigamox . Continue Ilevro 1 drop 1x/day for 3 more weeks. Continue Pred Acetate 1 drop 2x/day for 3 more weeks.  3.  will address after fragment is healed.

## 2024-04-21 ENCOUNTER — Encounter (HOSPITAL_COMMUNITY)
Admission: RE | Admit: 2024-04-21 | Discharge: 2024-04-21 | Disposition: A | Discharge status: Home / Self Care | Source: Ambulatory Visit | Attending: Ophthalmology | Admitting: Ophthalmology

## 2024-04-21 ENCOUNTER — Encounter (HOSPITAL_COMMUNITY): Payer: Self-pay

## 2024-04-23 ENCOUNTER — Ambulatory Visit (HOSPITAL_COMMUNITY): Admit: 2024-04-23 | Admitting: Ophthalmology

## 2024-04-23 ENCOUNTER — Other Ambulatory Visit: Payer: Self-pay

## 2024-04-23 ENCOUNTER — Encounter (HOSPITAL_COMMUNITY): Payer: Self-pay | Admitting: Ophthalmology

## 2024-04-23 ENCOUNTER — Ambulatory Visit (HOSPITAL_COMMUNITY)
Admission: RE | Admit: 2024-04-23 | Discharge: 2024-04-23 | Disposition: A | Discharge status: Home / Self Care | Attending: Ophthalmology | Admitting: Ophthalmology

## 2024-04-23 ENCOUNTER — Ambulatory Visit (HOSPITAL_COMMUNITY): Admitting: Registered Nurse

## 2024-04-23 ENCOUNTER — Encounter (HOSPITAL_COMMUNITY)
Admission: RE | Disposition: A | Discharge status: Home / Self Care | Payer: Self-pay | Source: Home / Self Care | Attending: Ophthalmology

## 2024-04-23 ENCOUNTER — Ambulatory Visit (HOSPITAL_BASED_OUTPATIENT_CLINIC_OR_DEPARTMENT_OTHER): Admitting: Registered Nurse

## 2024-04-23 DIAGNOSIS — Z87891 Personal history of nicotine dependence: Secondary | ICD-10-CM | POA: Diagnosis not present

## 2024-04-23 DIAGNOSIS — J45909 Unspecified asthma, uncomplicated: Secondary | ICD-10-CM | POA: Diagnosis not present

## 2024-04-23 DIAGNOSIS — H59022 Cataract (lens) fragments in eye following cataract surgery, left eye: Secondary | ICD-10-CM

## 2024-04-23 DIAGNOSIS — Z189 Retained foreign body fragments, unspecified material: Secondary | ICD-10-CM | POA: Insufficient documentation

## 2024-04-23 DIAGNOSIS — H25811 Combined forms of age-related cataract, right eye: Secondary | ICD-10-CM | POA: Insufficient documentation

## 2024-04-23 DIAGNOSIS — Z9842 Cataract extraction status, left eye: Secondary | ICD-10-CM | POA: Insufficient documentation

## 2024-04-23 HISTORY — PX: REMOVAL RETAINED LENS: SHX6464

## 2024-04-23 SURGERY — REMOVAL, RETAINED LENS MATTER
Anesthesia: Monitor Anesthesia Care | Site: Eye | Laterality: Left

## 2024-04-23 SURGERY — PHACOEMULSIFICATION, CATARACT, WITH IOL INSERTION
Anesthesia: Monitor Anesthesia Care | Laterality: Right

## 2024-04-23 MED ORDER — CHLORHEXIDINE GLUCONATE 0.12 % MT SOLN: 15.0000 mL | Freq: Once | OROMUCOSAL | Status: DC

## 2024-04-23 MED ORDER — LACTATED RINGERS IV SOLN: INTRAVENOUS | Status: DC

## 2024-04-23 MED ORDER — SODIUM CHLORIDE 0.9% FLUSH
INTRAVENOUS | Status: DC | PRN
Start: 2024-04-23 — End: 2024-04-23
  Administered 2024-04-23: 10 mL via INTRAVENOUS

## 2024-04-23 MED ORDER — STERILE WATER FOR IRRIGATION IR SOLN
Status: DC | PRN
  Administered 2024-04-23: 250 mL

## 2024-04-23 MED ORDER — ORAL CARE MOUTH RINSE: 15.0000 mL | Freq: Once | OROMUCOSAL | Status: DC

## 2024-04-23 MED ORDER — MIDAZOLAM HCL 2 MG/2ML IJ SOLN
INTRAMUSCULAR | Status: DC | PRN
  Administered 2024-04-23: 1 mg via INTRAVENOUS

## 2024-04-23 MED ORDER — MIDAZOLAM HCL 2 MG/2ML IJ SOLN
INTRAMUSCULAR | Status: AC
  Filled 2024-04-23: qty 2

## 2024-04-23 MED ORDER — SIGHTPATH DOSE#1 SODIUM HYALURONATE 10 MG/ML IO SOLUTION
PREFILLED_SYRINGE | INTRAOCULAR | Status: DC | PRN
  Administered 2024-04-23: .85 mL via INTRAOCULAR

## 2024-04-23 MED ORDER — TETRACAINE 0.5 % OP SOLN OPTIME - NO CHARGE
OPHTHALMIC | Status: DC | PRN
  Administered 2024-04-23: 2 [drp] via OPHTHALMIC

## 2024-04-23 MED ORDER — TETRACAINE HCL 0.5 % OP SOLN
1.0000 [drp] | OPHTHALMIC | Status: AC | PRN
  Administered 2024-04-23 (×3): 1 [drp] via OPHTHALMIC

## 2024-04-23 MED ORDER — MOXIFLOXACIN HCL 5 MG/ML IO SOLN
INTRAOCULAR | Status: DC | PRN
  Administered 2024-04-23: .2 mL via INTRACAMERAL

## 2024-04-23 MED ORDER — BSS IO SOLN
INTRAOCULAR | Status: DC | PRN
  Administered 2024-04-23: 500 mL via INTRAOCULAR
  Administered 2024-04-23: 15 mL via INTRAOCULAR

## 2024-04-23 MED ORDER — POVIDONE-IODINE 5 % OP SOLN
OPHTHALMIC | Status: DC | PRN
  Administered 2024-04-23: 1 via OPHTHALMIC

## 2024-04-23 SURGICAL SUPPLY — 12 items
CATARACT SUITE SIGHTPATH (MISCELLANEOUS) ×1 IMPLANT
CLOTH BEACON ORANGE TIMEOUT ST (SAFETY) ×1 IMPLANT
EYE SHIELD UNIVERSAL CLEAR (GAUZE/BANDAGES/DRESSINGS) IMPLANT
FEE CATARACT SUITE SIGHTPATH (MISCELLANEOUS) ×1 IMPLANT
GLOVE BIOGEL PI IND STRL 7.0 (GLOVE) ×2 IMPLANT
NDL HYPO 18GX1.5 BLUNT FILL (NEEDLE) ×1 IMPLANT
NEEDLE HYPO 18GX1.5 BLUNT FILL (NEEDLE) ×1 IMPLANT
PAD ARMBOARD POSITIONER FOAM (MISCELLANEOUS) ×1 IMPLANT
SYR TB 1ML LL NO SAFETY (SYRINGE) ×1 IMPLANT
TAPE SURG TRANSPORE 1 IN (GAUZE/BANDAGES/DRESSINGS) IMPLANT
TIP IRRIGATON/ASPIRATION (MISCELLANEOUS) IMPLANT
WATER STERILE IRR 250ML POUR (IV SOLUTION) ×1 IMPLANT

## 2024-04-23 NOTE — Transfer of Care (Signed)
 Immediate Anesthesia Transfer of Care Note  Patient: Kathy Barr  Procedure(s) Performed: REMOVAL, RETAINED LENS MATTER (Left: Eye)  Patient Location: Short Stay  Anesthesia Type:MAC  Level of Consciousness: awake, alert , and oriented  Airway & Oxygen Therapy: Patient Spontanous Breathing  Post-op Assessment: Report given to RN and Post -op Vital signs reviewed and stable  Post vital signs: Reviewed and stable  Last Vitals:  Vitals Value Taken Time  BP    Temp    Pulse    Resp    SpO2      Last Pain:  Vitals:   04/23/24 1241  TempSrc: Oral  PainSc: 0-No pain      Patients Stated Pain Goal: 5 (04/23/24 1241)  Complications: No notable events documented.

## 2024-04-23 NOTE — Interval H&P Note (Signed)
 History and Physical Interval Note:  04/23/2024 12:36 PM  Kathy Barr  has presented today for surgery, with the diagnosis of Retained lens fragment removal, left eye.  The various methods of treatment have been discussed with the patient and family. After consideration of risks, benefits and other options for treatment, the patient has consented to  Procedure(s): REMOVAL, RETAINED LENS MATTER (Left) as a surgical intervention.  The patient's history has been reviewed, patient examined, no change in status, stable for surgery.  I have reviewed the patient's chart and labs.  Questions were answered to the patient's satisfaction.     Tarri Farm

## 2024-04-23 NOTE — Anesthesia Procedure Notes (Signed)
 Procedure Name: MAC Date/Time: 04/23/2024 12:49 PM  Performed by: Sherwin Donate, CRNAPre-anesthesia Checklist: Patient identified, Emergency Drugs available, Suction available and Patient being monitored Patient Re-evaluated:Patient Re-evaluated prior to induction Oxygen Delivery Method: Nasal cannula Placement Confirmation: positive ETCO2

## 2024-04-23 NOTE — Anesthesia Postprocedure Evaluation (Signed)
 Anesthesia Post Note  Patient: Kathy Barr  Procedure(s) Performed: REMOVAL, RETAINED LENS MATTER (Left: Eye)  Patient location during evaluation: Phase II Anesthesia Type: MAC Level of consciousness: awake Pain management: pain level controlled Vital Signs Assessment: post-procedure vital signs reviewed and stable Respiratory status: spontaneous breathing and respiratory function stable Cardiovascular status: blood pressure returned to baseline and stable Postop Assessment: no headache and no apparent nausea or vomiting Anesthetic complications: no Comments: Late entry   No notable events documented.   Last Vitals:  Vitals:   04/23/24 1241 04/23/24 1304  BP: 112/73 120/72  Pulse: 75 80  Resp: 20 20  Temp: 36.4 C (!) 36.4 C  SpO2: 100% 100%    Last Pain:  Vitals:   04/23/24 1304  TempSrc: Oral  PainSc:                  Coretha Dew

## 2024-04-23 NOTE — Discharge Instructions (Signed)
 Please discharge patient when stable, will follow up today with Dr. June Leap at the [facility] office immediately following discharge.  Leave shield in place until visit.  All paperwork with discharge instructions will be given at the office.  [facility] Address:  [address]  [place], [place] [address]

## 2024-04-23 NOTE — Op Note (Signed)
 Date of procedure: 04/23/24  Pre-operative diagnosis: Retained lens material after cataract surgery Left Eye   Post-operative diagnosis:Retained lens material after cataract surgery Left Eye  Procedure: Removal of retained lens material using aspiration, left eye  Attending surgeon: Pleas Brill. Ahmya Bernick, MD, MA  Anesthesia: MAC, Topical Akten   Complications: None  Estimated Blood Loss: <49mL (minimal)  Specimens: None  Implants: As above  Indications:  Retained lens material after cataract surgery Left Eye  Procedure:  The patient was seen and identified in the pre-operative area. The operative eye was identified and dilated.  The operative eye was marked.  Topical anesthesia was administered to the operative eye.     The patient was then to the operative suite and placed in the supine position.  A timeout was performed confirming the patient, procedure to be performed, and all other relevant information.   The patient's face was prepped and draped in the usual fashion for intra-ocular surgery.  A lid speculum was placed into the operative eye and the surgical microscope moved into place and focused.  An inferotemporal paracentesis was created using a 20 gauge paracentesis blade.   Viscoelastic was injected into the anterior chamber.  A superior paracentesis incision made. The lens fragment was then removed from the anterior chamber using the bimanual irrigation/aspiration handpiece. The paracentesis wounds were then hydrated and checked with Weck-Cels to be watertight. 0.1mL of Moxfloxacin was injected into the anterior chamber. The lid-speculum was removed.  The drape was removed.  The patient's face was cleaned with a wet and dry 4x4.    A clear shield was taped over the eye. The patient was taken to the post-operative care unit in good condition, having tolerated the procedure well.  Post-Op Instructions: The patient will follow up at [facility] for a same day post-operative  evaluation and will receive all other orders and instructions.

## 2024-04-23 NOTE — Anesthesia Preprocedure Evaluation (Signed)
 Anesthesia Evaluation  Patient identified by MRN, date of birth, ID band Patient awake    Reviewed: Allergy & Precautions, H&P , NPO status , Patient's Chart, lab work & pertinent test results, reviewed documented beta blocker date and time   Airway Mallampati: II  TM Distance: >3 FB Neck ROM: full    Dental no notable dental hx. (+) Dental Advisory Given, Teeth Intact   Pulmonary shortness of breath, asthma , former smoker   Pulmonary exam normal breath sounds clear to auscultation       Cardiovascular Exercise Tolerance: Good hypertension, Normal cardiovascular exam Rhythm:regular Rate:Normal     Neuro/Psych Seizures -,   Neuromuscular disease  negative psych ROS   GI/Hepatic Neg liver ROS,GERD  ,,IBS   Endo/Other  negative endocrine ROS    Renal/GU negative Renal ROS  negative genitourinary   Musculoskeletal  (+) Arthritis , Osteoarthritis,    Abdominal   Peds  Hematology negative hematology ROS (+)   Anesthesia Other Findings   Reproductive/Obstetrics negative OB ROS                             Anesthesia Physical Anesthesia Plan  ASA: 2  Anesthesia Plan: MAC   Post-op Pain Management: Minimal or no pain anticipated   Induction:   PONV Risk Score and Plan:   Airway Management Planned: Natural Airway and Nasal Cannula  Additional Equipment: None  Intra-op Plan:   Post-operative Plan:   Informed Consent: I have reviewed the patients History and Physical, chart, labs and discussed the procedure including the risks, benefits and alternatives for the proposed anesthesia with the patient or authorized representative who has indicated his/her understanding and acceptance.     Dental Advisory Given  Plan Discussed with: CRNA  Anesthesia Plan Comments:        Anesthesia Quick Evaluation

## 2024-04-24 ENCOUNTER — Encounter (HOSPITAL_COMMUNITY): Payer: Self-pay | Admitting: Ophthalmology

## 2024-05-20 ENCOUNTER — Ambulatory Visit: Admitting: Family Medicine

## 2024-05-24 ENCOUNTER — Encounter: Payer: Self-pay | Admitting: Family Medicine

## 2024-05-24 ENCOUNTER — Ambulatory Visit (INDEPENDENT_AMBULATORY_CARE_PROVIDER_SITE_OTHER): Admitting: Family Medicine

## 2024-05-24 VITALS — BP 124/68 | HR 83 | Temp 97.5°F | Ht 64.0 in | Wt 149.4 lb

## 2024-05-24 DIAGNOSIS — Z79899 Other long term (current) drug therapy: Secondary | ICD-10-CM | POA: Diagnosis not present

## 2024-05-24 DIAGNOSIS — J4520 Mild intermittent asthma, uncomplicated: Secondary | ICD-10-CM | POA: Diagnosis not present

## 2024-05-24 DIAGNOSIS — M51362 Other intervertebral disc degeneration, lumbar region with discogenic back pain and lower extremity pain: Secondary | ICD-10-CM | POA: Diagnosis not present

## 2024-05-24 DIAGNOSIS — I10 Essential (primary) hypertension: Secondary | ICD-10-CM | POA: Diagnosis not present

## 2024-05-24 DIAGNOSIS — M159 Polyosteoarthritis, unspecified: Secondary | ICD-10-CM

## 2024-05-24 DIAGNOSIS — E782 Mixed hyperlipidemia: Secondary | ICD-10-CM | POA: Diagnosis not present

## 2024-05-24 DIAGNOSIS — R300 Dysuria: Secondary | ICD-10-CM

## 2024-05-24 LAB — MICROSCOPIC EXAMINATION
Renal Epithel, UA: NONE SEEN /HPF
Yeast, UA: NONE SEEN

## 2024-05-24 LAB — URINALYSIS, ROUTINE W REFLEX MICROSCOPIC
Bilirubin, UA: NEGATIVE
Glucose, UA: NEGATIVE
Ketones, UA: NEGATIVE
Nitrite, UA: NEGATIVE
Protein,UA: NEGATIVE
Specific Gravity, UA: 1.01 (ref 1.005–1.030)
Urobilinogen, Ur: 0.2 mg/dL (ref 0.2–1.0)
pH, UA: 6 (ref 5.0–7.5)

## 2024-05-24 MED ORDER — HYDROCODONE-ACETAMINOPHEN 5-325 MG PO TABS: 1.0000 | ORAL_TABLET | Freq: Four times a day (QID) | ORAL | 0 refills | Status: DC | PRN

## 2024-05-24 NOTE — Progress Notes (Unsigned)
 Established Patient Office Visit  Subjective:  Patient ID: Kathy Barr, female    DOB: Nov 08, 1956  Age: 68 y.o. MRN: [id number]  CC:  Chief Complaint  Patient presents with   Medical Management of Chronic Issues    HPI Kathy Barr presents for chronic follow up.  HTN Complaint with meds - Yes Current Medications - losartan  and HCTZ  Checking BP at home ranging- not checking at home Pertinent ROS:  Headache - No Fatigue - No Visual Disturbances - No Chest pain - No Dyspnea - No Palpitations - No LE edema - No  2. HLD Regular diet, no structured exercise.   3. Asthma Denies symptoms.   4. Dysuria Intermittent dysuria with urgency and some urge incontinence. Denies fever, flank pain, hematuria. Not sexually active.   5. Generalized OA DDD Pain assessment: Cause of pain- generalized OA Pain location- all joints, mostly shoulder, hips, back, knees, hands Pain on scale of 1-10:  8 She takes her pain medication 2x a day some days and not all other days. She also takes meloxicam , biofeeze, and plain tylenol  prn. She also uses topical pain ointments. She has tried PT multiple times. She has had numerous joint injections. She also uses a Scientist, clinical (histocompatibility and immunogenetics). She has started using a cane prn. Recently saw ortho and has plans for right total knee replacement but this hasn't been scheduled yet. Frequency- daily What increases pain- activity, bending, twisting, stairs What makes pain Better-rest, pain medicine improves pain to 6/10 Effects on ADL - difficulty doing housework,  Any change in general medical condition-no  Current opioids rx- Norco q 6 hours prn # meds rx- 60 tablets monthly Effectiveness of current meds- brings pain down to a 5/10 Adverse reactions from pain meds- denies Morphine  equivalent- 150 mme per Rx Pill count performed-No Last drug screen - 02/15/22 ( high risk q81m, moderate risk q29m, low risk yearly ) Urine drug screen today- No Was the NCCSR reviewed-  yes  If yes were their any concerning findings? - no  Overdose risk: 140  Pain contract signed on: 09/03/23   Last took pain medication 2 days ago.   Past Medical History:  Diagnosis Date   Arthritis    all over (03/18/2013)   Carpal tunnel syndrome    right (03/18/2013)   Fracture 2013   RLE   GERD (gastroesophageal reflux disease)    OTC   Hypertension    IBS (irritable bowel syndrome)    Osteoarthritis of left hip    Osteopenia 05/28/2021   Seizures (HCC) 1960's; 1978   when I was little; when I was 7 months pregnant (03/18/2013)   Shortness of breath    not since I quit smoking (03/18/2013)    Past Surgical History:  Procedure Laterality Date   ANTERIOR CERVICAL DECOMP/DISCECTOMY FUSION  ?2003   ANTERIOR CERVICAL DECOMP/DISCECTOMY FUSION N/A 04/24/2018   Procedure: C6-7 PLATE REMOVAL, V7-8, C5-6 ANTERIOR CERVICAL DECOMPRESSION/DISCECTOMY & FUSION, ALLOGRAFT, PLATE;  Surgeon: Adah Acron, MD;  Location: [facility] OR;  Service: Orthopedics;  Laterality: N/A;   CATARACT EXTRACTION W/PHACO Left 04/09/2024   Procedure: PHACOEMULSIFICATION, CATARACT, WITH IOL INSERTION;  Surgeon: Tarri Farm, MD;  Location: [facility] ORS;  Service: Ophthalmology;  Laterality: Left;  CDE: 20.11   CHOLECYSTECTOMY  2003   COLONOSCOPY N/A 02/17/2014   Procedure: COLONOSCOPY;  Surgeon: Ruby Corporal, MD;  Location: [facility] SUITE;  Service: Endoscopy;  Laterality: N/A;  145-moved to 10:30 Ann to notify pt   COLONOSCOPY N/A 08/25/2019  Procedure: COLONOSCOPY;  Surgeon: Ruby Corporal, MD;  Location: [facility];  Service: Endoscopy;  Laterality: N/A;  1030   FOOT ARTHROTOMY Right 1990's   pin, after removing a bone    JOINT REPLACEMENT Left    hip and right   pinched nerve     POLYPECTOMY  08/25/2019   Procedure: POLYPECTOMY;  Surgeon: Ruby Corporal, MD;  Location: [facility];  Service: Endoscopy;;   REMOVAL RETAINED LENS Left 04/23/2024   Procedure: REMOVAL, RETAINED LENS MATTER;  Surgeon:  Tarri Farm, MD;  Location: [facility];  Service: Ophthalmology;  Laterality: Left;   TOTAL HIP ARTHROPLASTY  06/30/2012   Procedure: TOTAL HIP ARTHROPLASTY;  Surgeon: Alphonso Jean, MD;  Location: [facility] OR;  Service: Orthopedics;  Laterality: Left;  Left total hip replacement with metal and polypropylene pore coated implants   TOTAL HIP ARTHROPLASTY Right 03/16/2013   Procedure: RIGHT TOTAL HIP ARTHROPLASTY- right ;  Surgeon: Alphonso Jean, MD;  Location: [facility] OR;  Service: Orthopedics;  Laterality: Right;   TUBAL LIGATION  1978   VAGINAL HYSTERECTOMY  ~ 2002/06/26    Family History  Problem Relation Age of Onset   Hypertension Father    Diabetes Father    Hypertension Sister    Diabetes Sister    Hypertension Brother    Diabetes Brother    Diabetes Mother    Hypertension Mother    Hyperlipidemia Mother    COPD Brother    Heart disease Brother    Hypertension Brother     Social History   Socioeconomic History   Marital status: Divorced    Spouse name: Not on file   Number of children: 1   Years of education: 9   Highest education level: 9th grade  Occupational History    Employer: DISABLED  Tobacco Use   Smoking status: Former    Current packs/day: 0.25    Average packs/day: 0.3 packs/day for 25.0 years (6.3 ttl pk-yrs)    Types: Cigarettes   Smokeless tobacco: Never   Tobacco comments:    Light smoker - has tried to quit, but always starts back  Vaping Use   Vaping status: Never Used  Substance and Sexual Activity   Alcohol use: No    Alcohol/week: 0.0 standard drinks of alcohol   Drug use: No   Sexual activity: Yes  Other Topics Concern   Not on file  Social History Narrative   Her significant other of 15 years passed away in 2020/06/26   06-25-22 - She recently adopted a one year old. (From her ex-fiance's grandchild who's parents didn't take care of her well and she was taken by social services)   Daughter and mother live nearby   Social Drivers of Health   Financial  Resource Strain: Low Risk  (08/01/2023)   Overall Financial Resource Strain (CARDIA)    Difficulty of Paying Living Expenses: Not hard at all  Food Insecurity: No Food Insecurity (08/01/2023)   Hunger Vital Sign    Worried About Running Out of Food in the Last Year: Never true    Ran Out of Food in the Last Year: Never true  Transportation Needs: No Transportation Needs (08/01/2023)   PRAPARE - Administrator, Civil Service (Medical): No    Lack of Transportation (Non-Medical): No  Physical Activity: Insufficiently Active (08/01/2023)   Exercise Vital Sign    Days of Exercise per Week: 3 days    Minutes of Exercise per Session: 30  min  Stress: No Stress Concern Present (08/01/2023)   [employer] of Occupational Health - Occupational Stress Questionnaire    Feeling of Stress : Not at all  Social Connections: Socially Isolated (08/01/2023)   Social Connection and Isolation Panel    Frequency of Communication with Friends and Family: More than three times a week    Frequency of Social Gatherings with Friends and Family: Once a week    Attends Religious Services: Never    Database administrator or Organizations: No    Attends Banker Meetings: Never    Marital Status: Divorced  Catering manager Violence: Not At Risk (08/01/2023)   Humiliation, Afraid, Rape, and Kick questionnaire    Fear of Current or Ex-Partner: No    Emotionally Abused: No    Physically Abused: No    Sexually Abused: No    Outpatient Medications Prior to Visit  Medication Sig Dispense Refill   albuterol  (VENTOLIN  HFA) 108 (90 Base) MCG/ACT inhaler INHALE 2 PUFFS INTO LUNGS EVERY 6 HOURS AS NEEDED FOR WHEEZING OR SHORTNESS OF BREATH 18 g 2   Calcium Carb-Cholecalciferol  (CALCIUM 600+D3 PO) Take 1 tablet by mouth 2 (two) times daily.     hydrochlorothiazide  (HYDRODIURIL ) 25 MG tablet Take 1 tablet by mouth once daily 90 tablet 0   HYDROcodone -acetaminophen  (NORCO/VICODIN) 5-325 MG tablet Take  1 tablet by mouth every 6 (six) hours as needed for moderate pain (pain score 4-6). 60 tablet 0   loratadine  (CLARITIN ) 10 MG tablet Take 10 mg by mouth daily.     losartan  (COZAAR ) 50 MG tablet Take 1 tablet (50 mg total) by mouth daily. 90 tablet 3   meclizine  (ANTIVERT ) 12.5 MG tablet Take 1 tablet (12.5 mg total) by mouth 3 (three) times daily as needed for dizziness. 30 tablet 0   meloxicam  (MOBIC ) 7.5 MG tablet Take 1 tablet by mouth once daily 90 tablet 0   naloxone  (NARCAN ) nasal spray 4 mg/0.1 mL Nasally for overdose 1 each 3   potassium chloride  (KLOR-CON ) 10 MEQ tablet Take 1 tablet by mouth once daily 90 tablet 3   No facility-administered medications prior to visit.    No Known Allergies  ROS Review of Systems As per HPI.    Objective:    Physical Exam Vitals and nursing note reviewed.  Constitutional:      General: She is not in acute distress.    Appearance: Normal appearance. She is not ill-appearing, toxic-appearing or diaphoretic.  HENT:     Head: Normocephalic and atraumatic.     Mouth/Throat:     Mouth: Mucous membranes are moist.     Pharynx: Oropharynx is clear.   Eyes:     Extraocular Movements: Extraocular movements intact.     Conjunctiva/sclera: Conjunctivae normal.     Pupils: Pupils are equal, round, and reactive to light.    Cardiovascular:     Rate and Rhythm: Normal rate and regular rhythm.     Pulses: Normal pulses.     Heart sounds: Normal heart sounds. No murmur heard. Pulmonary:     Effort: Pulmonary effort is normal. No respiratory distress.     Breath sounds: Normal breath sounds.  Abdominal:     General: There is no distension.     Tenderness: There is no abdominal tenderness. There is no right CVA tenderness, left CVA tenderness, guarding or rebound.     Hernia: No hernia is present.   Musculoskeletal:        General: No  swelling.     Lumbar back: Tenderness present. No swelling, edema, deformity, signs of trauma or bony  tenderness.     Right lower leg: No edema.     Left lower leg: No edema.   Skin:    General: Skin is warm and dry.   Neurological:     General: No focal deficit present.     Mental Status: She is alert and oriented to person, place, and time.   Psychiatric:        Mood and Affect: Mood normal.        Behavior: Behavior normal.        Thought Content: Thought content normal.        Judgment: Judgment normal.     BP 124/68   Pulse 83   Temp (!) 97.5 F (36.4 C) (Temporal)   Ht 5' 4 (1.626 m)   Wt 149 lb 6.4 oz (67.8 kg)   SpO2 99%   BMI 25.64 kg/m  Wt Readings from Last 3 Encounters:  05/24/24 149 lb 6.4 oz (67.8 kg)  04/23/24 163 lb 2.3 oz (74 kg)  04/21/24 165 lb (74.8 kg)     Assessment & Plan:   Primary hypertension BP at goal.   Mixed hyperlipidemia Last LDL at 84.  Mild intermittent asthma without complication Well controlled on current regimen.   Generalized OA Degeneration of intervertebral disc of lumbar region with discogenic back pain and lower extremity pain Controlled substance agreement signed Stable. PDMP reviewed, no red flags. CSA and UDS updated today.  -     ToxASSURE Select 13 (MW), Urine -     HYDROcodone -Acetaminophen ; Take 1 tablet by mouth every 6 (six) hours as needed for moderate pain (pain score 4-6).  Dispense: 60 tablet; Refill: 0 -     HYDROcodone -Acetaminophen ; Take 1 tablet by mouth every 6 (six) hours as needed for moderate pain (pain score 4-6).  Dispense: 60 tablet; Refill: 0 -     HYDROcodone -Acetaminophen ; Take 1 tablet by mouth every 6 (six) hours as needed for moderate pain (pain score 4-6).  Dispense: 60 tablet; Refill: 0  Dysuria UA pending.  -     Urinalysis, Routine w reflex microscopic -     Urine Culture -     Microscopic Examination  Follow-up: Return in about 14 weeks (around 08/30/2024) for chronic follow up.   The patient indicates understanding of these issues and agrees with the plan.  Albertha Huger,  FNP

## 2024-05-24 NOTE — Patient Instructions (Signed)
 For [name]:  [facility] [address] [place], [place] [address] [phone]

## 2024-05-26 ENCOUNTER — Ambulatory Visit: Payer: Self-pay | Admitting: Family Medicine

## 2024-05-26 DIAGNOSIS — R300 Dysuria: Secondary | ICD-10-CM

## 2024-05-26 MED ORDER — CEPHALEXIN 500 MG PO CAPS: 500.0000 mg | ORAL_CAPSULE | Freq: Two times a day (BID) | ORAL | 0 refills | Status: AC

## 2024-05-27 LAB — URINE CULTURE

## 2024-05-27 NOTE — Telephone Encounter (Signed)
 Copied from CRM [id number]. Topic: Clinical - Lab/Test Results >> May 27, 2024  2:50 PM Zipporah Him wrote: Reason for CRM: Patient called back, missed a call. Relayed latest lab note to patient, patient verbalized understanding. No further questions.

## 2024-05-29 LAB — TOXASSURE SELECT 13 (MW), URINE

## 2024-06-08 ENCOUNTER — Other Ambulatory Visit: Payer: Self-pay | Admitting: Family Medicine

## 2024-06-08 DIAGNOSIS — Z1231 Encounter for screening mammogram for malignant neoplasm of breast: Secondary | ICD-10-CM

## 2024-06-28 ENCOUNTER — Other Ambulatory Visit: Payer: Self-pay | Admitting: Family Medicine

## 2024-06-28 DIAGNOSIS — I10 Essential (primary) hypertension: Secondary | ICD-10-CM

## 2024-07-19 ENCOUNTER — Ambulatory Visit
Admission: RE | Admit: 2024-07-19 | Discharge: 2024-07-19 | Disposition: A | Discharge status: Home / Self Care | Source: Ambulatory Visit | Attending: Family Medicine | Admitting: Family Medicine

## 2024-07-19 DIAGNOSIS — Z1231 Encounter for screening mammogram for malignant neoplasm of breast: Secondary | ICD-10-CM

## 2024-08-20 ENCOUNTER — Other Ambulatory Visit: Payer: Self-pay | Admitting: Family Medicine

## 2024-08-20 DIAGNOSIS — E876 Hypokalemia: Secondary | ICD-10-CM

## 2024-08-30 ENCOUNTER — Encounter: Payer: Self-pay | Admitting: Family Medicine

## 2024-08-30 ENCOUNTER — Ambulatory Visit: Admitting: Family Medicine

## 2024-08-30 VITALS — BP 91/60 | HR 70 | Temp 97.5°F | Ht 64.0 in | Wt 146.2 lb

## 2024-08-30 DIAGNOSIS — E782 Mixed hyperlipidemia: Secondary | ICD-10-CM

## 2024-08-30 DIAGNOSIS — Z79899 Other long term (current) drug therapy: Secondary | ICD-10-CM

## 2024-08-30 DIAGNOSIS — M159 Polyosteoarthritis, unspecified: Secondary | ICD-10-CM

## 2024-08-30 DIAGNOSIS — G919 Hydrocephalus, unspecified: Secondary | ICD-10-CM

## 2024-08-30 DIAGNOSIS — Z23 Encounter for immunization: Secondary | ICD-10-CM | POA: Diagnosis not present

## 2024-08-30 DIAGNOSIS — J4520 Mild intermittent asthma, uncomplicated: Secondary | ICD-10-CM | POA: Diagnosis not present

## 2024-08-30 DIAGNOSIS — M51362 Other intervertebral disc degeneration, lumbar region with discogenic back pain and lower extremity pain: Secondary | ICD-10-CM

## 2024-08-30 DIAGNOSIS — I10 Essential (primary) hypertension: Secondary | ICD-10-CM | POA: Diagnosis not present

## 2024-08-30 MED ORDER — HYDROCODONE-ACETAMINOPHEN 5-325 MG PO TABS: 1.0000 | ORAL_TABLET | Freq: Four times a day (QID) | ORAL | 0 refills | Status: DC | PRN

## 2024-08-30 NOTE — Progress Notes (Signed)
 Established Patient Office Visit  Subjective:  Patient ID: Kathy Barr, female    DOB: Dec 01, 1956  Age: 68 y.o. MRN: [id number]  CC:  Chief Complaint  Patient presents with   Medical Management of Chronic Issues    HPI Kathy Barr presents for chronic follow up.   Hypertension - Fatigue present this morning, attributed to inadequate food and fluid intake (only consumed a sip of soda with medication) - Low blood pressure noted today; last recorded measurement was 120 systolic at home - No dizziness, lightheadedness, chest pain, or shortness of breath - Does not regularly monitor blood pressure at home - Complaint with losartan  and HCTZ  Sleep disturbance - Sleep disrupted due to sharing a bed with her foster child, who moves frequently during the night  Psychosocial stressors - Experiences stress from frequent calls from her mother and managing her foster child's behavioral issues  Asthma symptoms - Asthma symptoms currently stable      Chronic pain Generalized OA DDD Pain assessment: Cause of pain- generalized OA Pain location- all joints, mostly shoulder, hips, back, knees, hands Pain on scale of 1-10:  8 She takes her pain medication 2x a day some days and not all other days. She also takes meloxicam , biofeeze, and plain tylenol  prn. She also uses topical pain ointments. She has tried PT multiple times. She has had numerous joint injections. She also uses a Scientist, clinical (histocompatibility and immunogenetics). She has started using a cane prn. Recently saw ortho and has plans for right total knee replacement at some point Frequency- daily What increases pain- activity, bending, twisting, stairs What makes pain Better-rest, pain medicine improves pain to 6/10 Effects on ADL - difficulty doing housework,  Any change in general medical condition-no  Current opioids rx- Norco q 6 hours prn. She doesn't take this every day.  # meds rx- 60 tablets monthly Effectiveness of current meds- brings pain down to a  5/10 Adverse reactions from pain meds- denies Morphine  equivalent- 150 mme per Rx Pill count performed-No Last drug screen - 02/15/22 ( high risk q45m, moderate risk q1m, low risk yearly ) Urine drug screen today- No Was the NCCSR reviewed- yes  If yes were their any concerning findings? - no. She uses sparingly.   Overdose risk: 140  Pain contract signed on: June 2025   Last took pain medication yesterday morning  Past Medical History:  Diagnosis Date   Arthritis    all over (03/18/2013)   Carpal tunnel syndrome    right (03/18/2013)   Fracture 2013   RLE   GERD (gastroesophageal reflux disease)    OTC   Hypertension    IBS (irritable bowel syndrome)    Osteoarthritis of left hip    Osteopenia 05/28/2021   Seizures (HCC) 1960's; 1978   when I was little; when I was 7 months pregnant (03/18/2013)   Shortness of breath    not since I quit smoking (03/18/2013)    Past Surgical History:  Procedure Laterality Date   ANTERIOR CERVICAL DECOMP/DISCECTOMY FUSION  ?2003   ANTERIOR CERVICAL DECOMP/DISCECTOMY FUSION N/A 04/24/2018   Procedure: C6-7 PLATE REMOVAL, R5-4, C5-6 ANTERIOR CERVICAL DECOMPRESSION/DISCECTOMY & FUSION, ALLOGRAFT, PLATE;  Surgeon: Barbarann Oneil BROCKS, MD;  Location: [facility];  Service: Orthopedics;  Laterality: N/A;   CATARACT EXTRACTION W/PHACO Left 04/09/2024   Procedure: PHACOEMULSIFICATION, CATARACT, WITH IOL INSERTION;  Surgeon: Harrie Agent, MD;  Location: [facility];  Service: Ophthalmology;  Laterality: Left;  CDE: 20.11   CHOLECYSTECTOMY  2003  COLONOSCOPY N/A 02/17/2014   Procedure: COLONOSCOPY;  Surgeon: Claudis RAYMOND Rivet, MD;  Location: [facility];  Service: Endoscopy;  Laterality: N/A;  145-moved to 10:30 Ann to notify pt   COLONOSCOPY N/A 08/25/2019   Procedure: COLONOSCOPY;  Surgeon: Rivet Claudis RAYMOND, MD;  Location: [facility];  Service: Endoscopy;  Laterality: N/A;  1030   FOOT ARTHROTOMY Right 1990's   pin, after removing a bone    JOINT  REPLACEMENT Left    hip and right   pinched nerve     POLYPECTOMY  08/25/2019   Procedure: POLYPECTOMY;  Surgeon: Rivet Claudis RAYMOND, MD;  Location: [facility];  Service: Endoscopy;;   REMOVAL RETAINED LENS Left 04/23/2024   Procedure: REMOVAL, RETAINED LENS MATTER;  Surgeon: Harrie Agent, MD;  Location: [facility];  Service: Ophthalmology;  Laterality: Left;   TOTAL HIP ARTHROPLASTY  06/30/2012   Procedure: TOTAL HIP ARTHROPLASTY;  Surgeon: Agent FORBES Better, MD;  Location: [facility];  Service: Orthopedics;  Laterality: Left;  Left total hip replacement with metal and polypropylene pore coated implants   TOTAL HIP ARTHROPLASTY Right 03/16/2013   Procedure: RIGHT TOTAL HIP ARTHROPLASTY- right ;  Surgeon: Agent FORBES Better, MD;  Location: [facility];  Service: Orthopedics;  Laterality: Right;   TUBAL LIGATION  1978   VAGINAL HYSTERECTOMY  ~ September 29, 2002    Family History  Problem Relation Age of Onset   Hypertension Father    Diabetes Father    Hypertension Sister    Diabetes Sister    Hypertension Brother    Diabetes Brother    Diabetes Mother    Hypertension Mother    Hyperlipidemia Mother    COPD Brother    Heart disease Brother    Hypertension Brother     Social History   Socioeconomic History   Marital status: Divorced    Spouse name: Not on file   Number of children: 1   Years of education: 9   Highest education level: 9th grade  Occupational History    Employer: DISABLED  Tobacco Use   Smoking status: Former    Current packs/day: 0.25    Average packs/day: 0.3 packs/day for 25.0 years (6.3 ttl pk-yrs)    Types: Cigarettes   Smokeless tobacco: Never   Tobacco comments:    Light smoker - has tried to quit, but always starts back  Vaping Use   Vaping status: Never Used  Substance and Sexual Activity   Alcohol use: No    Alcohol/week: 0.0 standard drinks of alcohol   Drug use: No   Sexual activity: Yes  Other Topics Concern   Not on file  Social History Narrative   Her significant other  of 15 years passed away in 09-29-2020   2022/06/20 - She recently adopted a one year old. (From her ex-fiance's grandchild who's parents didn't take care of her well and she was taken by social services)   Daughter and mother live nearby   Social Drivers of Health   Financial Resource Strain: Low Risk  (08/01/2023)   Overall Financial Resource Strain (CARDIA)    Difficulty of Paying Living Expenses: Not hard at all  Food Insecurity: No Food Insecurity (08/01/2023)   Hunger Vital Sign    Worried About Running Out of Food in the Last Year: Never true    Ran Out of Food in the Last Year: Never true  Transportation Needs: No Transportation Needs (08/01/2023)   PRAPARE - Administrator, Civil Service (Medical):  No    Lack of Transportation (Non-Medical): No  Physical Activity: Insufficiently Active (08/01/2023)   Exercise Vital Sign    Days of Exercise per Week: 3 days    Minutes of Exercise per Session: 30 min  Stress: No Stress Concern Present (08/01/2023)   [employer] of Occupational Health - Occupational Stress Questionnaire    Feeling of Stress : Not at all  Social Connections: Socially Isolated (08/01/2023)   Social Connection and Isolation Panel    Frequency of Communication with Friends and Family: More than three times a week    Frequency of Social Gatherings with Friends and Family: Once a week    Attends Religious Services: Never    Database administrator or Organizations: No    Attends Banker Meetings: Never    Marital Status: Divorced  Catering manager Violence: Not At Risk (08/01/2023)   Humiliation, Afraid, Rape, and Kick questionnaire    Fear of Current or Ex-Partner: No    Emotionally Abused: No    Physically Abused: No    Sexually Abused: No    Outpatient Medications Prior to Visit  Medication Sig Dispense Refill   albuterol  (VENTOLIN  HFA) 108 (90 Base) MCG/ACT inhaler INHALE 2 PUFFS INTO LUNGS EVERY 6 HOURS AS NEEDED FOR WHEEZING OR SHORTNESS  OF BREATH 18 g 2   Calcium Carb-Cholecalciferol  (CALCIUM 600+D3 PO) Take 1 tablet by mouth 2 (two) times daily.     hydrochlorothiazide  (HYDRODIURIL ) 25 MG tablet Take 1 tablet by mouth once daily 90 tablet 0   HYDROcodone -acetaminophen  (NORCO/VICODIN) 5-325 MG tablet Take 1 tablet by mouth every 6 (six) hours as needed for moderate pain (pain score 4-6). 60 tablet 0   loratadine  (CLARITIN ) 10 MG tablet Take 10 mg by mouth daily.     losartan  (COZAAR ) 50 MG tablet Take 1 tablet (50 mg total) by mouth daily. 90 tablet 3   meclizine  (ANTIVERT ) 12.5 MG tablet Take 1 tablet (12.5 mg total) by mouth 3 (three) times daily as needed for dizziness. 30 tablet 0   meloxicam  (MOBIC ) 7.5 MG tablet Take 1 tablet by mouth once daily 90 tablet 0   potassium chloride  (KLOR-CON ) 10 MEQ tablet Take 1 tablet by mouth once daily 90 tablet 0   naloxone  (NARCAN ) nasal spray 4 mg/0.1 mL Nasally for overdose (Patient not taking: Reported on 08/30/2024) 1 each 3   No facility-administered medications prior to visit.    No Known Allergies  ROS Review of Systems As per HPI.    Objective:    Physical Exam Constitutional:      General: She is not in acute distress.    Appearance: She is well-developed. She is not ill-appearing, toxic-appearing or diaphoretic.  Neck:     Thyroid : No thyromegaly.     Vascular: No JVD.     Trachea: Trachea normal.  Cardiovascular:     Rate and Rhythm: Normal rate and regular rhythm.     Heart sounds: Normal heart sounds. No murmur heard.    No friction rub. No gallop.  Pulmonary:     Effort: Pulmonary effort is normal.     Breath sounds: Normal breath sounds.  Musculoskeletal:     Cervical back: Full passive range of motion without pain.     Right lower leg: No edema.     Left lower leg: No edema.  Skin:    General: Skin is warm and dry.  Neurological:     Mental Status: She is alert and oriented to  person, place, and time.     Deep Tendon Reflexes: Reflexes are normal  and symmetric.  Psychiatric:        Mood and Affect: Mood normal.        Behavior: Behavior normal.        Thought Content: Thought content normal.        Judgment: Judgment normal.     BP 91/60   Pulse 70   Temp (!) 97.5 F (36.4 C) (Temporal)   Ht 5' 4 (1.626 m)   Wt 146 lb 3.2 oz (66.3 kg)   SpO2 99%   BMI 25.10 kg/m  Wt Readings from Last 3 Encounters:  05/24/24 149 lb 6.4 oz (67.8 kg)  04/23/24 163 lb 2.3 oz (74 kg)  04/21/24 165 lb (74.8 kg)   BP Readings from Last 3 Encounters:  08/30/24 91/60  05/24/24 124/68  04/23/24 120/72     Assessment & Plan:   Kathy Barr was seen today for medical management of chronic issues.  Diagnoses and all orders for this visit:  Primary hypertension BP soft today. Discussed hydration. Monitor BP at home and notify for high or low readings for adjustment of medication as needed.   Mixed hyperlipidemia Last LDL 84. Diet, exercise, weight loss.   Hydrocephalus, unspecified type (HCC) Denies symptoms. CT from 12/25 with stable ventricle dilation.   Mild intermittent asthma without complication Well controlled on current regimen.   Generalized OA Degeneration of intervertebral disc of lumbar region with discogenic back pain and lower extremity pain Controlled substance agreement signed Well controlled on current regimen. PDMP reviewed no red flags. CSA is UTD. Patient does not take medication daily and last UDS was negative and has been in the past. She reports today norco yesterday. Repeat UDS today.  -     HYDROcodone -acetaminophen  (NORCO/VICODIN) 5-325 MG tablet; Take 1 tablet by mouth every 6 (six) hours as needed for moderate pain (pain score 4-6). -     HYDROcodone -acetaminophen  (NORCO/VICODIN) 5-325 MG tablet; Take 1 tablet by mouth every 6 (six) hours as needed for moderate pain (pain score 4-6). -     HYDROcodone -acetaminophen  (NORCO/VICODIN) 5-325 MG tablet; Take 1 tablet by mouth every 6 (six) hours as needed for moderate  pain (pain score 4-6). -     Cancel: ToxASSURE Select 13 (MW), Urine -     ToxASSURE Select 13 (MW), Urine  Encounter for immunization -     Flu vaccine HIGH DOSE PF(Fluzone Trivalent)   Follow-up: Return in about 3 months (around 11/29/2024) for chronic follow up.   The patient indicates understanding of these issues and agrees with the plan.  Kathy CHRISTELLA Search, FNP

## 2024-09-02 LAB — TOXASSURE SELECT 13 (MW), URINE

## 2024-09-03 ENCOUNTER — Ambulatory Visit: Payer: Self-pay | Admitting: Family Medicine

## 2024-09-03 DIAGNOSIS — H25811 Combined forms of age-related cataract, right eye: Secondary | ICD-10-CM | POA: Diagnosis not present

## 2024-09-07 ENCOUNTER — Encounter (HOSPITAL_COMMUNITY): Payer: Self-pay

## 2024-09-07 ENCOUNTER — Other Ambulatory Visit: Payer: Self-pay

## 2024-09-07 ENCOUNTER — Encounter (HOSPITAL_COMMUNITY)
Admission: RE | Admit: 2024-09-07 | Discharge: 2024-09-07 | Disposition: A | Discharge status: Home / Self Care | Source: Ambulatory Visit | Attending: Ophthalmology | Admitting: Ophthalmology

## 2024-09-07 NOTE — H&P (Signed)
 Surgical History & Physical  Patient Name: Kathy Barr  DOB: [birth year]  Surgery: Cataract extraction with intraocular lens implant phacoemulsification; Right Eye Surgeon: Lynwood Hermann MD Surgery Date: 09/10/2024 Pre-Op Date: 07/06/2024  HPI: A 11 Yr. old female patient presents for an overdue 2 week CEIOL PO and an increase of floaters OS. Patient states that they are intermittent and describes them as black bugs. Patient reports that she has FOL OS every morning when I wake up. Patient states that her vision is good OS. Patient states that her OD is stable, blurred but the same. Patient denies any ocular pain. The patient's vision is continuously blurry in the right eye. She has trouble with glare and seeing people's faces. This is negatively affecting the patient's quality of life and the patient is unable to function adequately in life with the current level of vision. Patient states that she still has drops but states that she has completed her regimen as instructed. HPI Completed by Dr. Lynwood Hermann  Medical History: Cataracts  High Blood Pressure  Review of Systems Cardiovascular High Blood Pressure  Social Former smoker   Medication Prednisolone acetate, Ilevro, Moxifloxacin ,  Hydrocodone -acetaminophen , Meloxicam , Hydrochlorothiazide , Losartan , Amoxicillin -pot clavulanate, Potassium chloride   Sx/Procedures Phaco c IOL OS, Removal of retained lens fragment,  Neck surgery, Hip Replacement  Drug Allergies  NKDA  History & Physical: Heent: cataract NECK: supple without bruits LUNGS: lungs clear to auscultation CV: regular rate and rhythm Abdomen: soft and non-tender  Impression & Plan: Assessment: 1.  NUCLEAR SCLEROSIS AGE RELATED; Right Eye (H25.11) 2.  LENS FRAGMENTS IN EYE FOLLOWING CATARACT SURGERY; Left Eye (H59.022) - Resolved 3.  CATARACT EXTRACTION STATUS; Left Eye (Z98.42)  Plan: 1.  Cataract accounts for the patient's decreased vision. This visual  impairment is not correctable with a tolerable change in glasses or contact lenses. Cataract surgery with an implantation of a new lens should significantly improve the visual and functional status of the patient. Discussed all risks, benefits, alternatives, and potential complications. Discussed the procedures and recovery. Patient desires to have surgery. A-scan ordered and performed today for intra-ocular lens calculations. The surgery will be performed in order to improve vision for driving, reading, and for eye examinations. Recommend phacoemulsification with intra-ocular lens. Recommend Dextenza  for post-operative pain and inflammation. History of refractive Surgery: None Use of Eye Pressure Lowering Drops: None Right Eye. Surgery required to correct imbalance of vision. Dilates well - shugarcaine or Lidocaine +Omidira by protocol  2.  Same day post-op exam. Doing well. All post-op precautions discussed and instructions reviewed. Patient is to continue post op drops.  3.  Well healed.

## 2024-09-10 ENCOUNTER — Ambulatory Visit (HOSPITAL_COMMUNITY): Admitting: Anesthesiology

## 2024-09-10 ENCOUNTER — Encounter (HOSPITAL_COMMUNITY): Admission: RE | Disposition: A | Payer: Self-pay | Source: Home / Self Care | Attending: Ophthalmology

## 2024-09-10 ENCOUNTER — Encounter (HOSPITAL_COMMUNITY): Payer: Self-pay | Admitting: Ophthalmology

## 2024-09-10 ENCOUNTER — Ambulatory Visit (HOSPITAL_BASED_OUTPATIENT_CLINIC_OR_DEPARTMENT_OTHER): Admitting: Anesthesiology

## 2024-09-10 ENCOUNTER — Ambulatory Visit (HOSPITAL_COMMUNITY)
Admission: RE | Admit: 2024-09-10 | Discharge: 2024-09-10 | Disposition: A | Discharge status: Home / Self Care | Attending: Ophthalmology | Admitting: Ophthalmology

## 2024-09-10 DIAGNOSIS — H25811 Combined forms of age-related cataract, right eye: Secondary | ICD-10-CM

## 2024-09-10 DIAGNOSIS — I10 Essential (primary) hypertension: Secondary | ICD-10-CM

## 2024-09-10 DIAGNOSIS — Z9842 Cataract extraction status, left eye: Secondary | ICD-10-CM | POA: Diagnosis not present

## 2024-09-10 DIAGNOSIS — Z87891 Personal history of nicotine dependence: Secondary | ICD-10-CM

## 2024-09-10 DIAGNOSIS — R569 Unspecified convulsions: Secondary | ICD-10-CM | POA: Diagnosis not present

## 2024-09-10 HISTORY — PX: CATARACT EXTRACTION W/PHACO: SHX586

## 2024-09-10 SURGERY — PHACOEMULSIFICATION, CATARACT, WITH IOL INSERTION
Anesthesia: Monitor Anesthesia Care | Site: Eye | Laterality: Right

## 2024-09-10 MED ORDER — SODIUM HYALURONATE 23MG/ML IO SOSY
PREFILLED_SYRINGE | INTRAOCULAR | Status: DC | PRN
  Administered 2024-09-10: .6 mL via INTRAOCULAR

## 2024-09-10 MED ORDER — TROPICAMIDE 1 % OP SOLN
1.0000 [drp] | OPHTHALMIC | Status: DC | PRN
  Administered 2024-09-10 (×2): 1 [drp] via OPHTHALMIC

## 2024-09-10 MED ORDER — BSS IO SOLN
INTRAOCULAR | Status: DC | PRN
  Administered 2024-09-10: 15 mL via INTRAOCULAR

## 2024-09-10 MED ORDER — PHENYLEPHRINE HCL 2.5 % OP SOLN
1.0000 [drp] | OPHTHALMIC | Status: DC | PRN
  Administered 2024-09-10 (×2): 1 [drp] via OPHTHALMIC

## 2024-09-10 MED ORDER — MIDAZOLAM HCL 2 MG/2ML IJ SOLN
INTRAMUSCULAR | Status: AC
  Filled 2024-09-10: qty 2

## 2024-09-10 MED ORDER — PHENYLEPHRINE-KETOROLAC 1-0.3 % IO SOLN
INTRAOCULAR | Status: DC | PRN
  Administered 2024-09-10: 500 mL via OPHTHALMIC

## 2024-09-10 MED ORDER — SODIUM HYALURONATE 10 MG/ML IO SOLUTION
PREFILLED_SYRINGE | INTRAOCULAR | Status: DC | PRN
  Administered 2024-09-10: .85 mL via INTRAOCULAR

## 2024-09-10 MED ORDER — TETRACAINE HCL 0.5 % OP SOLN
1.0000 [drp] | OPHTHALMIC | Status: AC | PRN
  Administered 2024-09-10 (×3): 1 [drp] via OPHTHALMIC

## 2024-09-10 MED ORDER — STERILE WATER FOR IRRIGATION IR SOLN
Status: DC | PRN
  Administered 2024-09-10: 1

## 2024-09-10 MED ORDER — LIDOCAINE HCL 3.5 % OP GEL
1.0000 | Freq: Once | OPHTHALMIC | Status: AC
  Administered 2024-09-10: 1 via OPHTHALMIC

## 2024-09-10 MED ORDER — MIDAZOLAM HCL 2 MG/2ML IJ SOLN
INTRAMUSCULAR | Status: DC | PRN
Start: 2024-09-10 — End: 2024-09-10
  Administered 2024-09-10: 1 mg via INTRAVENOUS

## 2024-09-10 MED ORDER — MOXIFLOXACIN HCL 5 MG/ML IO SOLN
INTRAOCULAR | Status: DC | PRN
  Administered 2024-09-10: .2 mL via INTRACAMERAL

## 2024-09-10 MED ORDER — LIDOCAINE HCL (PF) 1 % IJ SOLN
INTRAMUSCULAR | Status: DC | PRN
  Administered 2024-09-10: 1 mL

## 2024-09-10 MED ORDER — LACTATED RINGERS IV SOLN: INTRAVENOUS | Status: DC

## 2024-09-10 MED ORDER — SODIUM CHLORIDE 0.9% FLUSH
INTRAVENOUS | Status: DC | PRN
Start: 2024-09-10 — End: 2024-09-10
  Administered 2024-09-10: 3 mL via INTRAVENOUS

## 2024-09-10 MED ORDER — POVIDONE-IODINE 5 % OP SOLN
OPHTHALMIC | Status: DC | PRN
  Administered 2024-09-10: 1 via OPHTHALMIC

## 2024-09-10 SURGICAL SUPPLY — 11 items
CLOTH BEACON ORANGE TIMEOUT ST (SAFETY) ×1 IMPLANT
EYE SHIELD UNIVERSAL CLEAR (GAUZE/BANDAGES/DRESSINGS) IMPLANT
FEE CATARACT SUITE SIGHTPATH (MISCELLANEOUS) ×1 IMPLANT
GLOVE BIOGEL PI IND STRL 7.0 (GLOVE) ×2 IMPLANT
LENS IOL TECNIS EYHANCE 23.5 (Intraocular Lens) IMPLANT
NDL HYPO 18GX1.5 BLUNT FILL (NEEDLE) ×1 IMPLANT
NEEDLE HYPO 18GX1.5 BLUNT FILL (NEEDLE) ×1 IMPLANT
PAD ARMBOARD POSITIONER FOAM (MISCELLANEOUS) ×1 IMPLANT
SYR TB 1ML LL NO SAFETY (SYRINGE) ×1 IMPLANT
TAPE SURG TRANSPORE 1 IN (GAUZE/BANDAGES/DRESSINGS) IMPLANT
WATER STERILE IRR 250ML POUR (IV SOLUTION) ×1 IMPLANT

## 2024-09-10 NOTE — Op Note (Signed)
 Date of procedure: 09/10/24  Pre-operative diagnosis:  Visually significant combined form age-related cataract, Right Eye (H25.811)  Post-operative diagnosis:  Visually significant combined form age-related cataract, Right Eye (H25.811)  Procedure: Removal of cataract via phacoemulsification and insertion of intra-ocular lens Vicci and Johnson DIB00 +23.5D into the capsular bag of the Right Eye  Attending surgeon: Lynwood LABOR. Chanya Chrisley, MD, MA  Anesthesia: MAC, Topical Akten   Complications: None  Estimated Blood Loss: <53mL (minimal)  Specimens: None  Implants: As above  Indications:  Visually significant age-related cataract, Right Eye  Procedure:  The patient was seen and identified in the pre-operative area. The operative eye was identified and dilated.  The operative eye was marked.  Topical anesthesia was administered to the operative eye.     The patient was then to the operative suite and placed in the supine position.  A timeout was performed confirming the patient, procedure to be performed, and all other relevant information.   The patient's face was prepped and draped in the usual fashion for intra-ocular surgery.  A lid speculum was placed into the operative eye and the surgical microscope moved into place and focused.  A superotemporal paracentesis was created using a 20 gauge paracentesis blade. Omidria was injected into the anterior chamber. Shugarcaine was injected into the anterior chamber.  Viscoelastic was injected into the anterior chamber.  A temporal clear-corneal main wound incision was created using a 2.62mm microkeratome.  A continuous curvilinear capsulorrhexis was initiated using an irrigating cystitome and completed using capsulorrhexis forceps.  Hydrodissection and hydrodeliniation were performed.  Viscoelastic was injected into the anterior chamber.  A phacoemulsification handpiece and a chopper as a second instrument were used to remove the nucleus and epinucleus.  The irrigation/aspiration handpiece was used to remove any remaining cortical material.   The capsular bag was reinflated with viscoelastic, checked, and found to be intact.  The intraocular lens was inserted into the capsular bag.  The irrigation/aspiration handpiece was used to remove any remaining viscoelastic.  The clear corneal wound and paracentesis wounds were then hydrated and checked with Weck-Cels to be watertight. 0.1mL of Moxfloxacin was injected into the anterior chamber. The lid-speculum was removed.  The drape was removed.  The patient's face was cleaned with a wet and dry 4x4. A clear shield was taped over the eye. The patient was taken to the post-operative care unit in good condition, having tolerated the procedure well.  Post-Op Instructions: The patient will follow up at [facility] for a same day post-operative evaluation and will receive all other orders and instructions.

## 2024-09-10 NOTE — Anesthesia Preprocedure Evaluation (Addendum)
 Anesthesia Evaluation  Patient identified by MRN, date of birth, ID band Patient awake    Reviewed: Allergy & Precautions, H&P , NPO status , Patient's Chart, lab work & pertinent test results, reviewed documented beta blocker date and time   Airway Mallampati: II  TM Distance: >3 FB Neck ROM: full    Dental  (+) Lower Dentures, Upper Dentures   Pulmonary shortness of breath, asthma , Current Smoker, former smoker   Pulmonary exam normal breath sounds clear to auscultation       Cardiovascular Exercise Tolerance: Good hypertension, Normal cardiovascular exam Rhythm:regular Rate:Normal     Neuro/Psych Seizures -,   Neuromuscular disease  negative psych ROS   GI/Hepatic Neg liver ROS,GERD  ,,IBS   Endo/Other  negative endocrine ROS    Renal/GU negative Renal ROS  negative genitourinary   Musculoskeletal  (+) Arthritis , Osteoarthritis,    Abdominal   Peds  Hematology negative hematology ROS (+)   Anesthesia Other Findings   Reproductive/Obstetrics negative OB ROS                              Anesthesia Physical Anesthesia Plan  ASA: 2  Anesthesia Plan: MAC   Post-op Pain Management: Minimal or no pain anticipated   Induction:   PONV Risk Score and Plan:   Airway Management Planned: Natural Airway and Nasal Cannula  Additional Equipment: None  Intra-op Plan:   Post-operative Plan:   Informed Consent: I have reviewed the patients History and Physical, chart, labs and discussed the procedure including the risks, benefits and alternatives for the proposed anesthesia with the patient or authorized representative who has indicated his/her understanding and acceptance.     Dental Advisory Given  Plan Discussed with: CRNA  Anesthesia Plan Comments:         Anesthesia Quick Evaluation

## 2024-09-10 NOTE — Transfer of Care (Signed)
 Immediate Anesthesia Transfer of Care Note  Patient: Kathy Barr  Procedure(s) Performed: PHACOEMULSIFICATION, CATARACT, WITH IOL INSERTION (Right: Eye)  Patient Location: Short Stay  Anesthesia Type:MAC  Level of Consciousness: awake and patient cooperative  Airway & Oxygen Therapy: Patient Spontanous Breathing  Post-op Assessment: Report given to RN and Post -op Vital signs reviewed and stable  Post vital signs: Reviewed and stable  Last Vitals:  Vitals Value Taken Time  BP 119/59 09/10/24 10:21  Temp 36.6 C 09/10/24 10:21  Pulse 66 09/10/24 10:21  Resp 18 09/10/24 10:21  SpO2 100 % 09/10/24 10:21    Last Pain:  Vitals:   09/10/24 1021  TempSrc: Oral  PainSc: 0-No pain      Patients Stated Pain Goal: 8 (09/10/24 1021)  Complications: No notable events documented.

## 2024-09-10 NOTE — Interval H&P Note (Signed)
 History and Physical Interval Note:  09/10/2024 9:54 AM  Kathy Barr  has presented today for surgery, with the diagnosis of combined forms age related cataract, right eye.  The various methods of treatment have been discussed with the patient and family. After consideration of risks, benefits and other options for treatment, the patient has consented to  Procedure(s): PHACOEMULSIFICATION, CATARACT, WITH IOL INSERTION (Right) as a surgical intervention.  The patient's history has been reviewed, patient examined, no change in status, stable for surgery.  I have reviewed the patient's chart and labs.  Questions were answered to the patient's satisfaction.     HARRIE AGENT

## 2024-09-10 NOTE — Anesthesia Postprocedure Evaluation (Signed)
 Anesthesia Post Note  Patient: Kathy Barr  Procedure(s) Performed: PHACOEMULSIFICATION, CATARACT, WITH IOL INSERTION (Right: Eye)  Patient location during evaluation: PACU Anesthesia Type: MAC Level of consciousness: awake and alert Pain management: pain level controlled Vital Signs Assessment: post-procedure vital signs reviewed and stable Respiratory status: spontaneous breathing, nonlabored ventilation, respiratory function stable and patient connected to nasal cannula oxygen Cardiovascular status: stable and blood pressure returned to baseline Postop Assessment: no apparent nausea or vomiting Anesthetic complications: no   There were no known notable events for this encounter.   Last Vitals:  Vitals:   09/10/24 0938 09/10/24 1021  BP: 138/62 (!) 119/59  Pulse: 71 66  Resp: 18 18  Temp: 36.4 C 36.6 C  SpO2: 99% 100%    Last Pain:  Vitals:   09/10/24 1021  TempSrc: Oral  PainSc: 0-No pain                 Shade Kaley L Mishael Krysiak

## 2024-09-10 NOTE — Anesthesia Procedure Notes (Signed)
 Date/Time: 09/10/2024 10:00 AM  Performed by: Barbarann Verneita RAMAN, CRNAPre-anesthesia Checklist: Patient identified, Emergency Drugs available, Suction available, Timeout performed and Patient being monitored Patient Re-evaluated:Patient Re-evaluated prior to induction Oxygen Delivery Method: Nasal Cannula

## 2024-09-10 NOTE — Discharge Instructions (Addendum)
 Please discharge patient when stable, will follow up today with Dr. June Leap at the [facility] office immediately following discharge.  Leave shield in place until visit.  All paperwork with discharge instructions will be given at the office.  [facility] Address:  [address]  [place], [place] [address]

## 2024-09-25 ENCOUNTER — Other Ambulatory Visit: Payer: Self-pay | Admitting: Family Medicine

## 2024-09-25 DIAGNOSIS — I10 Essential (primary) hypertension: Secondary | ICD-10-CM

## 2024-10-04 ENCOUNTER — Ambulatory Visit (INDEPENDENT_AMBULATORY_CARE_PROVIDER_SITE_OTHER)

## 2024-10-04 VITALS — BP 91/60 | HR 70 | Ht 64.0 in | Wt 146.0 lb

## 2024-10-04 DIAGNOSIS — Z0000 Encounter for general adult medical examination without abnormal findings: Secondary | ICD-10-CM

## 2024-10-04 NOTE — Progress Notes (Signed)
 Subjective:   Kathy Barr is a 68 y.o. who presents for a Medicare Wellness preventive visit.  As a reminder, Annual Wellness Visits don't include a physical exam, and some assessments may be limited, especially if this visit is performed virtually. We may recommend an in-person follow-up visit with your provider if needed.  Visit Complete: Virtual I connected with  Kathy Barr on 10/04/24 by a audio enabled telemedicine application and verified that I am speaking with the correct person using two identifiers.  Patient Location: Home  Provider Location: Home Office  I discussed the limitations of evaluation and management by telemedicine. The patient expressed understanding and agreed to proceed.  Vital Signs: Because this visit was a virtual/telehealth visit, some criteria may be missing or patient reported. Any vitals not documented were not able to be obtained and vitals that have been documented are patient reported.  VideoDeclined- This patient declined Librarian, academic. Therefore the visit was completed with audio only.  Persons Participating in Visit: Patient.  AWV Questionnaire: No: Patient Medicare AWV questionnaire was not completed prior to this visit.  Cardiac Risk Factors include: advanced age (>63men, >77 women);dyslipidemia;hypertension;smoking/ tobacco exposure     Objective:    Today's Vitals   10/04/24 1238  BP: 91/60  Pulse: 70  Weight: 146 lb (66.2 kg)  Height: 5' 4 (1.626 m)   Body mass index is 25.06 kg/m.     10/04/2024   10:10 AM 09/10/2024    9:38 AM 09/07/2024   10:33 AM 04/23/2024   11:56 AM 08/01/2023    8:34 AM 05/29/2022    3:44 PM 05/28/2021    3:52 PM  Advanced Directives  Does Patient Have a Medical Advance Directive? No No No No Yes Yes No  Type of Agricultural Consultant;Living will Healthcare Power of [place];Living will   Copy of Healthcare Power of Attorney in Chart?      No - copy requested No - copy requested   Would patient like information on creating a medical advance directive?  No - Patient declined No - Patient declined    No - Patient declined    Current Medications (verified) Outpatient Encounter Medications as of 10/04/2024  Medication Sig   albuterol  (VENTOLIN  HFA) 108 (90 Base) MCG/ACT inhaler INHALE 2 PUFFS INTO LUNGS EVERY 6 HOURS AS NEEDED FOR WHEEZING OR SHORTNESS OF BREATH   Calcium Carb-Cholecalciferol  (CALCIUM 600+D3 PO) Take 1 tablet by mouth 2 (two) times daily.   hydrochlorothiazide  (HYDRODIURIL ) 25 MG tablet Take 1 tablet by mouth once daily   [START ON 11/06/2024] HYDROcodone -acetaminophen  (NORCO/VICODIN) 5-325 MG tablet Take 1 tablet by mouth every 6 (six) hours as needed for moderate pain (pain score 4-6).   [START ON 10/07/2024] HYDROcodone -acetaminophen  (NORCO/VICODIN) 5-325 MG tablet Take 1 tablet by mouth every 6 (six) hours as needed for moderate pain (pain score 4-6).   HYDROcodone -acetaminophen  (NORCO/VICODIN) 5-325 MG tablet Take 1 tablet by mouth every 6 (six) hours as needed for moderate pain (pain score 4-6).   loratadine  (CLARITIN ) 10 MG tablet Take 10 mg by mouth daily.   losartan  (COZAAR ) 50 MG tablet Take 1 tablet (50 mg total) by mouth daily.   meclizine  (ANTIVERT ) 12.5 MG tablet Take 1 tablet (12.5 mg total) by mouth 3 (three) times daily as needed for dizziness.   meloxicam  (MOBIC ) 7.5 MG tablet Take 1 tablet by mouth once daily   potassium chloride  (KLOR-CON ) 10 MEQ tablet Take 1  tablet by mouth once daily   naloxone  (NARCAN ) nasal spray 4 mg/0.1 mL Nasally for overdose (Patient not taking: Reported on 10/04/2024)   No facility-administered encounter medications on file as of 10/04/2024.    Allergies (verified) Patient has no known allergies.   History: Past Medical History:  Diagnosis Date   Arthritis    all over (03/18/2013)   Carpal tunnel syndrome    right (03/18/2013)   Fracture 2013   RLE   GERD  (gastroesophageal reflux disease)    OTC   Hypertension    IBS (irritable bowel syndrome)    Osteoarthritis of left hip    Osteopenia 05/28/2021   Seizures (HCC) 1960's; 1978   when I was little; when I was 7 months pregnant (03/18/2013)   Shortness of breath    not since I quit smoking (03/18/2013)   Past Surgical History:  Procedure Laterality Date   ANTERIOR CERVICAL DECOMP/DISCECTOMY FUSION  ?2003   ANTERIOR CERVICAL DECOMP/DISCECTOMY FUSION N/A 04/24/2018   Procedure: C6-7 PLATE REMOVAL, R5-4, C5-6 ANTERIOR CERVICAL DECOMPRESSION/DISCECTOMY & FUSION, ALLOGRAFT, PLATE;  Surgeon: Barbarann Oneil BROCKS, MD;  Location: [facility];  Service: Orthopedics;  Laterality: N/A;   CATARACT EXTRACTION W/PHACO Left 04/09/2024   Procedure: PHACOEMULSIFICATION, CATARACT, WITH IOL INSERTION;  Surgeon: Harrie Agent, MD;  Location: [facility];  Service: Ophthalmology;  Laterality: Left;  CDE: 20.11   CATARACT EXTRACTION W/PHACO Right 09/10/2024   Procedure: PHACOEMULSIFICATION, CATARACT, WITH IOL INSERTION;  Surgeon: Harrie Agent, MD;  Location: [facility];  Service: Ophthalmology;  Laterality: Right;  CDE: 11.24   CHOLECYSTECTOMY  2003   COLONOSCOPY N/A 02/17/2014   Procedure: COLONOSCOPY;  Surgeon: Claudis RAYMOND Rivet, MD;  Location: [facility];  Service: Endoscopy;  Laterality: N/A;  145-moved to 10:30 Ann to notify pt   COLONOSCOPY N/A 08/25/2019   Procedure: COLONOSCOPY;  Surgeon: Rivet Claudis RAYMOND, MD;  [facility]: [facility];  Service: Endoscopy;  Laterality: N/A;  1030   FOOT ARTHROTOMY Right 1990's   pin, after removing a bone    JOINT REPLACEMENT Left    hip and right   pinched nerve     POLYPECTOMY  08/25/2019   Procedure: POLYPECTOMY;  Surgeon: Rivet Claudis RAYMOND, MD;  [facility]: [facility];  Service: Endoscopy;;   REMOVAL RETAINED LENS Left 04/23/2024   Procedure: REMOVAL, RETAINED LENS MATTER;  Surgeon: Harrie Agent, MD;  Location: [facility];  Service: Ophthalmology;  Laterality: Left;   TOTAL HIP  ARTHROPLASTY  06/30/2012   Procedure: TOTAL HIP ARTHROPLASTY;  Surgeon: Agent FORBES Better, MD;  Location: [facility];  Service: Orthopedics;  Laterality: Left;  Left total hip replacement with metal and polypropylene pore coated implants   TOTAL HIP ARTHROPLASTY Right 03/16/2013   Procedure: RIGHT TOTAL HIP ARTHROPLASTY- right ;  Surgeon: Agent FORBES Better, MD;  Location: [facility];  Service: Orthopedics;  Laterality: Right;   TUBAL LIGATION  1978   VAGINAL HYSTERECTOMY  ~ 2003   Family History  Problem Relation Age of Onset   Hypertension Father    Diabetes Father    Hypertension Sister    Diabetes Sister    Hypertension Brother    Diabetes Brother    Diabetes Mother    Hypertension Mother    Hyperlipidemia Mother    COPD Brother    Heart disease Brother    Hypertension Brother    Social History   Socioeconomic History   Marital status: Divorced    Spouse name: Not on file   Number of  children: 1   Years of education: 9   Highest education level: 9th grade  Occupational History    Employer: DISABLED  Tobacco Use   Smoking status: Some Days    Current packs/day: 0.25    Average packs/day: 0.3 packs/day for 25.0 years (6.3 ttl pk-yrs)    Types: Cigarettes   Smokeless tobacco: Never   Tobacco comments:    Light smoker - has tried to quit, but always starts back  Vaping Use   Vaping status: Never Used  Substance and Sexual Activity   Alcohol use: No    Alcohol/week: 0.0 standard drinks of alcohol   Drug use: No   Sexual activity: Yes  Other Topics Concern   Not on file  Social History Narrative   Her significant other of 15 years passed away in 11-07-2020   06/24/22 - She recently adopted a one year old. (From her ex-fiance's grandchild who's parents didn't take care of her well and she was taken by social services)   Daughter and mother live nearby   Social Drivers of Health   Financial Resource Strain: High Risk (10/04/2024)   Overall Financial Resource Strain (CARDIA)    Difficulty  of Paying Living Expenses: Hard  Food Insecurity: No Food Insecurity (10/04/2024)   Hunger Vital Sign    Worried About Running Out of Food in the Last Year: Never true    Ran Out of Food in the Last Year: Never true  Transportation Needs: No Transportation Needs (10/04/2024)   PRAPARE - Administrator, Civil Service (Medical): No    Lack of Transportation (Non-Medical): No  Physical Activity: Insufficiently Active (10/04/2024)   Exercise Vital Sign    Days of Exercise per Week: 3 days    Minutes of Exercise per Session: 30 min  Stress: No Stress Concern Present (10/04/2024)   [employer] of Occupational Health - Occupational Stress Questionnaire    Feeling of Stress: Only a little  Social Connections: Socially Isolated (10/04/2024)   Social Connection and Isolation Panel    Frequency of Communication with Friends and Family: More than three times a week    Frequency of Social Gatherings with Friends and Family: Once a week    Attends Religious Services: Never    Database Administrator or Organizations: No    Attends Engineer, Structural: Never    Marital Status: Divorced    Tobacco Counseling Ready to quit: No Counseling given: Yes Tobacco comments: Light smoker - has tried to quit, but always starts back    Clinical Intake:  Pre-visit preparation completed: Yes  Pain : No/denies pain     Nutritional Risks: None Diabetes: No  Lab Results  Component Value Date   HGBA1C 4.8 09/03/2023   HGBA1C 4.6 (L) 05/16/2022   HGBA1C 5.1 06/19/2020     How often do you need to have someone help you when you read instructions, pamphlets, or other written materials from your doctor or pharmacy?: 1 - Never  Interpreter Needed?: No  Information entered by :: alia T/cma   Activities of Daily Living       10/04/2024   10:06 AM 09/07/2024   10:31 AM  In your present state of health, do you have any difficulty performing the following activities:   Hearing? 1   Vision? 0   Difficulty concentrating or making decisions? 0   Walking or climbing stairs? 0   Dressing or bathing? 0   Doing errands, shopping? 0 0  Preparing Food and eating ? N   Using the Toilet? N   In the past six months, have you accidently leaked urine? N   Do you have problems with loss of bowel control? N   Managing your Medications? N   Managing your Finances? N   Housekeeping or managing your Housekeeping? N     Patient Care Team: Joesph Annabella HERO, FNP as PCP - General (Family Medicine) Nicholaus Sherlean CROME, [facility] (Inactive) as Pharmacist (Pharmacist) Vicci Mcardle, OD (Optometry)  I have updated your Care Teams any recent Medical Services you may have received from other providers in the past year.     Assessment:   This is a routine wellness examination for Ilda.  Hearing/Vision screen Hearing Screening - Comments:: Pt have a little bit of hearing dif Vision Screening - Comments:: Pt wear glasses/pt goes to [facility] [address] in [place]/last 2025   Goals Addressed             This Visit's Progress    Exercise 3x per week (30 min per time)   On track      Depression Screen     10/04/2024   10:11 AM 08/30/2024   10:29 AM 05/24/2024    3:12 PM 03/11/2024    2:19 PM 12/17/2023    1:02 PM 12/08/2023   10:13 AM 09/03/2023    9:22 AM  PHQ 2/9 Scores  PHQ - 2 Score 1 2 1  0 0 1 2  PHQ- 9 Score 3 4 4  0 0 4 13    Fall Risk     10/04/2024   10:04 AM 08/30/2024   10:29 AM 05/24/2024    3:11 PM 03/11/2024    2:19 PM 12/17/2023    1:02 PM  Fall Risk   Falls in the past year? 0 0 1 1 0  Number falls in past yr: 0  1 1   Injury with Fall? 0  1 0   Risk for fall due to : No Fall Risks  History of fall(s) History of fall(s)   Follow up Falls evaluation completed  Falls evaluation completed Falls evaluation completed     MEDICARE RISK AT HOME:  Medicare Risk at Home Any stairs in or around the home?: Yes If so, are there any without handrails?: Yes Home  free of loose throw rugs in walkways, pet beds, electrical cords, etc?: Yes Adequate lighting in your home to reduce risk of falls?: Yes Life alert?: No Use of a cane, walker or w/c?: Yes Grab bars in the bathroom?: Yes Shower chair or bench in shower?: Yes Elevated toilet seat or a handicapped toilet?: Yes  TIMED UP AND GO:  Was the test performed?  no  Cognitive Function: 6CIT completed        10/04/2024   10:10 AM 08/01/2023    8:35 AM 05/29/2022    3:46 PM 05/28/2021    3:43 PM  6CIT Screen  What Year? 0 points 0 points 0 points 0 points  What month? 0 points 0 points 0 points 0 points  What time? 0 points 0 points 0 points 0 points  Count back from 20 0 points 0 points 0 points 0 points  Months in reverse 0 points 0 points 0 points 0 points  Repeat phrase 0 points 0 points 4 points 0 points  Total Score 0 points 0 points 4 points 0 points    Immunizations Immunization History  Administered Date(s) Administered   Fluad Quad(high Dose 65+) 08/28/2022  Fluad Trivalent(High Dose 65+) 09/03/2023   INFLUENZA, HIGH DOSE SEASONAL PF 09/30/2020, 09/22/2021, 08/30/2024   Influenza, Quadrivalent, Recombinant, Inj, Pf 09/12/2018   Influenza,inj,Quad PF,6+ Mos 08/27/2017, 09/12/2018, 08/23/2019   Influenza-Unspecified 10/17/2015, 09/05/2016, 08/23/2019   Moderna Sars-Covid-2 Vaccination 02/21/2020, 03/20/2020   PNEUMOCOCCAL CONJUGATE-20 08/17/2021   Pneumococcal Polysaccharide-23 03/18/2013, 06/19/2020   Td 05/03/1998, 05/30/2021   Tdap 03/21/2011   Zoster Recombinant(Shingrix) 09/12/2018, 01/13/2019    Screening Tests Health Maintenance  Topic Date Due   COVID-19 Vaccine (3 - Moderna risk series) 12/05/2024 (Originally 04/17/2020)   Mammogram  07/19/2025   DEXA SCAN  09/04/2025   Medicare Annual Wellness (AWV)  10/04/2025   Colonoscopy  08/24/2026   DTaP/Tdap/Td (4 - Td or Tdap) 05/31/2031   Pneumococcal Vaccine: 50+ Years  Completed   Influenza Vaccine  Completed    Hepatitis C Screening  Completed   Zoster Vaccines- Shingrix  Completed   Meningococcal B Vaccine  Aged Out    Health Maintenance Items Addressed: See Nurse Notes at the end of this note  Additional Screening:  Vision Screening: Recommended annual ophthalmology exams for early detection of glaucoma and other disorders of the eye. Is the patient up to date with their annual eye exam?  Yes  Who is the provider or what is the name of the office in which the patient attends annual eye exams? [facility]  Dental Screening: Recommended annual dental exams for proper oral hygiene  Community Resource Referral / Chronic Care Management: CRR required this visit?  No   CCM required this visit?  No   Plan:    I have personally reviewed and noted the following in the patient's chart:   Medical and social history Use of alcohol, tobacco or illicit drugs  Current medications and supplements including opioid prescriptions. Patient is not currently taking opioid prescriptions. Functional ability and status Nutritional status Physical activity Advanced directives List of other physicians Hospitalizations, surgeries, and ER visits in previous 12 months Vitals Screenings to include cognitive, depression, and falls Referrals and appointments  In addition, I have reviewed and discussed with patient certain preventive protocols, quality metrics, and best practice recommendations. A written personalized care plan for preventive services as well as general preventive health recommendations were provided to patient.   Ozie Ned, CMA   10/04/2024   After Visit Summary: (MyChart) Due to this being a telephonic visit, the after visit summary with patients personalized plan was offered to patient via MyChart   Notes: Nothing significant to report at this time.

## 2024-10-04 NOTE — Patient Instructions (Signed)
 [name],  Thank you for taking the time for your Medicare Wellness Visit. I appreciate your continued commitment to your health goals. Please review the care plan we discussed, and feel free to reach out if I can assist you further.  Medicare recommends these wellness visits once per year to help you and your care team stay ahead of potential health issues. These visits are designed to focus on prevention, allowing your provider to concentrate on managing your acute and chronic conditions during your regular appointments.  Please note that Annual Wellness Visits do not include a physical exam. Some assessments may be limited, especially if the visit was conducted virtually. If needed, we may recommend a separate in-person follow-up with your provider.  Ongoing Care Seeing your primary care provider every 3 to 6 months helps us  monitor your health and provide consistent, personalized care.   Referrals If a referral was made during today's visit and you haven't received any updates within two weeks, please contact the referred provider directly to check on the status.  Recommended Screenings:  Health Maintenance  Topic Date Due   Medicare Annual Wellness Visit  07/31/2024   COVID-19 Vaccine (3 - Moderna risk series) 12/05/2024*   Breast Cancer Screening  07/19/2025   DEXA scan (bone density measurement)  09/04/2025   Colon Cancer Screening  08/24/2026   DTaP/Tdap/Td vaccine (4 - Td or Tdap) 05/31/2031   Pneumococcal Vaccine for age over 42  Completed   Flu Shot  Completed   Hepatitis C Screening  Completed   Zoster (Shingles) Vaccine  Completed   Meningitis B Vaccine  Aged Out  *Topic was postponed. The date shown is not the original due date.       10/04/2024   10:10 AM  Advanced Directives  Does Patient Have a Medical Advance Directive? No   Advance Care Planning is important because it: Ensures you receive medical care that aligns with your values, goals, and  preferences. Provides guidance to your family and loved ones, reducing the emotional burden of decision-making during critical moments.  Vision: Annual vision screenings are recommended for early detection of glaucoma, cataracts, and diabetic retinopathy. These exams can also reveal signs of chronic conditions such as diabetes and high blood pressure.  Dental: Annual dental screenings help detect early signs of oral cancer, gum disease, and other conditions linked to overall health, including heart disease and diabetes.  Please see the attached documents for additional preventive care recommendations.

## 2024-11-23 ENCOUNTER — Other Ambulatory Visit: Payer: Self-pay | Admitting: Family Medicine

## 2024-11-23 DIAGNOSIS — E876 Hypokalemia: Secondary | ICD-10-CM

## 2024-11-29 ENCOUNTER — Encounter: Payer: Self-pay | Admitting: Family Medicine

## 2024-11-29 ENCOUNTER — Ambulatory Visit: Payer: Self-pay | Admitting: Family Medicine

## 2024-11-29 VITALS — BP 118/71 | HR 77 | Ht 64.0 in | Wt 147.0 lb

## 2024-11-29 DIAGNOSIS — M51362 Other intervertebral disc degeneration, lumbar region with discogenic back pain and lower extremity pain: Secondary | ICD-10-CM

## 2024-11-29 DIAGNOSIS — Z0000 Encounter for general adult medical examination without abnormal findings: Secondary | ICD-10-CM

## 2024-11-29 DIAGNOSIS — M159 Polyosteoarthritis, unspecified: Secondary | ICD-10-CM

## 2024-11-29 DIAGNOSIS — E876 Hypokalemia: Secondary | ICD-10-CM

## 2024-11-29 DIAGNOSIS — E782 Mixed hyperlipidemia: Secondary | ICD-10-CM

## 2024-11-29 DIAGNOSIS — Z79899 Other long term (current) drug therapy: Secondary | ICD-10-CM

## 2024-11-29 DIAGNOSIS — I10 Essential (primary) hypertension: Secondary | ICD-10-CM | POA: Diagnosis not present

## 2024-11-29 LAB — CMP14+EGFR
ALT: 8 IU/L (ref 0–32)
AST: 18 IU/L (ref 0–40)
Albumin: 4 g/dL (ref 3.9–4.9)
Alkaline Phosphatase: 81 IU/L (ref 49–135)
BUN/Creatinine Ratio: 14 (ref 12–28)
BUN: 10 mg/dL (ref 8–27)
Bilirubin Total: 0.9 mg/dL (ref 0.0–1.2)
CO2: 24 mmol/L (ref 20–29)
Calcium: 9.5 mg/dL (ref 8.7–10.3)
Chloride: 101 mmol/L (ref 96–106)
Creatinine, Ser: 0.69 mg/dL (ref 0.57–1.00)
Globulin, Total: 2.7 g/dL (ref 1.5–4.5)
Glucose: 91 mg/dL (ref 70–99)
Potassium: 3.9 mmol/L (ref 3.5–5.2)
Sodium: 139 mmol/L (ref 134–144)
Total Protein: 6.7 g/dL (ref 6.0–8.5)
eGFR: 94 mL/min/1.73

## 2024-11-29 LAB — CBC WITH DIFFERENTIAL/PLATELET
Basophils Absolute: 0 x10E3/uL (ref 0.0–0.2)
Basos: 1 %
EOS (ABSOLUTE): 0.1 x10E3/uL (ref 0.0–0.4)
Eos: 3 %
Hematocrit: 42.6 % (ref 34.0–46.6)
Hemoglobin: 14.1 g/dL (ref 11.1–15.9)
Immature Grans (Abs): 0 x10E3/uL (ref 0.0–0.1)
Immature Granulocytes: 0 %
Lymphocytes Absolute: 1.3 x10E3/uL (ref 0.7–3.1)
Lymphs: 25 %
MCH: 28.7 pg (ref 26.6–33.0)
MCHC: 33.1 g/dL (ref 31.5–35.7)
MCV: 87 fL (ref 79–97)
Monocytes Absolute: 0.5 x10E3/uL (ref 0.1–0.9)
Monocytes: 9 %
Neutrophils Absolute: 3.2 x10E3/uL (ref 1.4–7.0)
Neutrophils: 62 %
Platelets: 237 x10E3/uL (ref 150–450)
RBC: 4.91 x10E6/uL (ref 3.77–5.28)
RDW: 12.7 % (ref 11.7–15.4)
WBC: 5.1 x10E3/uL (ref 3.4–10.8)

## 2024-11-29 MED ORDER — LOSARTAN POTASSIUM 50 MG PO TABS: 50.0000 mg | ORAL_TABLET | Freq: Every day | ORAL | 3 refills | Status: DC

## 2024-11-29 MED ORDER — HYDROCODONE-ACETAMINOPHEN 5-325 MG PO TABS: 1.0000 | ORAL_TABLET | Freq: Four times a day (QID) | ORAL | 0 refills | Status: AC | PRN

## 2024-11-29 MED ORDER — HYDROCHLOROTHIAZIDE 25 MG PO TABS: 25.0000 mg | ORAL_TABLET | Freq: Every day | ORAL | 1 refills | Status: AC

## 2024-11-29 MED ORDER — POTASSIUM CHLORIDE ER 10 MEQ PO TBCR: 10.0000 meq | EXTENDED_RELEASE_TABLET | Freq: Every day | ORAL | 0 refills | Status: DC

## 2024-11-29 NOTE — Patient Instructions (Signed)

## 2024-11-29 NOTE — Progress Notes (Signed)
 "  Complete physical exam  Patient: Kathy Barr   DOB: 04/02/1956   68 y.o. Female  MRN: [id number]  Subjective:    Chief Complaint  Patient presents with   Medical Management of Chronic Issues   Annual Exam    Kathy Barr is a 68 y.o. female who presents today for a complete physical exam. She reports consuming a general diet. The patient does not participate in regular exercise at present. She generally feels fairly well. She reports sleeping poorly. Her foster child sleeps with her and this disrupts her sleep. She does not have additional problems to discuss today.   Chronic pain Generalized OA DDD Pain assessment: Cause of pain- generalized OA Pain location- all joints, mostly shoulder, hips, back, knees, hands Pain on scale of 1-10:  8 She takes her pain medication 2x a day some days and not all other days. She also takes meloxicam , biofeeze, and tylenol  arthritis  prn. She also uses topical pain ointments. She has tried PT multiple times. She has had numerous joint injections. She also uses a scientist, clinical (histocompatibility and immunogenetics). She has started using a cane prn. She has plans for right total knee replacement at some point Frequency- daily What increases pain- activity, bending, twisting, stairs, providing care for her foster child What makes pain Better-rest, pain medicine improves pain to 6/10 Effects on ADL - difficulty doing housework, physical activity with foster child Any change in general medical condition-no   Current opioids rx- Norco q 6 hours prn. She doesn't take this every day.  # meds rx- 60 tablets monthly Effectiveness of current meds- brings pain down to a 5/10 Adverse reactions from pain meds- denies Morphine  equivalent- 10 avg mme/day Pill count performed-No Last drug screen - 08/30/24 ( high risk q95m, moderate risk q63m, low risk yearly ) Urine drug screen today- No Was the NCCSR reviewed- yes             If yes were their any concerning findings? - no. She uses sparingly.     Overdose risk: 140   Pain contract signed on: June 2025  Most recent fall risk assessment:    11/29/2024   10:14 AM  Fall Risk   Falls in the past year? 0  Number falls in past yr: 0  Injury with Fall? 0  Risk for fall due to : No Fall Risks  Follow up Falls evaluation completed     Most recent depression screenings:    11/29/2024   10:14 AM 10/04/2024   10:11 AM  PHQ 2/9 Scores  PHQ - 2 Score 0 1  PHQ- 9 Score 3 3      Data saved with a previous flowsheet row definition        Patient Care Team: Joesph Annabella HERO, FNP as PCP - General (Family Medicine) Nicholaus Sherlean CROME, [facility] (Inactive) as Pharmacist (Pharmacist) Vicci Mcardle, OD (Optometry)   Show/hide medication list[1]  ROS Negative unless specially indicated above in HPI.     Objective:     BP 118/71   Pulse 77   Ht 5' 4 (1.626 m)   Wt 147 lb (66.7 kg)   SpO2 98%   BMI 25.23 kg/m    Physical Exam Constitutional:      General: She is not in acute distress.    Appearance: She is well-developed. She is not ill-appearing, toxic-appearing or diaphoretic.  HENT:     Right Ear: Tympanic membrane, ear canal and external ear normal.  Left Ear: Tympanic membrane, ear canal and external ear normal.     Nose: Nose normal.     Mouth/Throat:     Mouth: Mucous membranes are moist.     Pharynx: Oropharynx is clear.  Eyes:     General:        Right eye: No discharge.        Left eye: No discharge.     Conjunctiva/sclera: Conjunctivae normal.  Neck:     Thyroid : No thyroid  mass, thyromegaly or thyroid  tenderness.     Vascular: No JVD.     Trachea: Trachea normal.  Cardiovascular:     Rate and Rhythm: Normal rate and regular rhythm.     Heart sounds: Normal heart sounds. No murmur heard.    No friction rub. No gallop.  Pulmonary:     Effort: Pulmonary effort is normal.     Breath sounds: Normal breath sounds.  Abdominal:     General: Bowel sounds are normal. There is no distension.      Palpations: Abdomen is soft.     Tenderness: There is no abdominal tenderness. There is no guarding or rebound.  Musculoskeletal:     Cervical back: Full passive range of motion without pain. No rigidity.     Right lower leg: No edema.     Left lower leg: No edema.  Skin:    General: Skin is warm and dry.  Neurological:     Mental Status: She is alert and oriented to person, place, and time.     Deep Tendon Reflexes: Reflexes are normal and symmetric.  Psychiatric:        Mood and Affect: Mood normal.        Behavior: Behavior normal.        Thought Content: Thought content normal.        Judgment: Judgment normal.      No results found for any visits on 11/29/24.     Assessment & Plan:    Routine Health Maintenance and Physical Exam  Kathy Barr was seen today for medical management of chronic issues and annual exam.  Diagnoses and all orders for this visit:  Routine general medical examination at a health care facility  Primary hypertension Well controlled on current regimen.  -     hydrochlorothiazide  (HYDRODIURIL ) 25 MG tablet; Take 1 tablet (25 mg total) by mouth daily. -     losartan  (COZAAR ) 50 MG tablet; Take 1 tablet (50 mg total) by mouth daily. -     CBC with Differential/Platelet -     CMP14+EGFR  Mixed hyperlipidemia Last lipid panel at goal.   Hypokalemia -     potassium chloride  (KLOR-CON ) 10 MEQ tablet; Take 1 tablet (10 mEq total) by mouth daily. -     CMP14+EGFR  Generalized OA Degeneration of intervertebral disc of lumbar region with discogenic back pain and lower extremity pain Controlled substance agreement signed Stable. PDMP reviewed no red flags. CSA and UDS are UTD. She plans to schedule follow up with ortho. She will contact me for a referral if a referral is needed.  -     HYDROcodone -acetaminophen  (NORCO/VICODIN) 5-325 MG tablet; Take 1 tablet by mouth every 6 (six) hours as needed for moderate pain (pain score 4-6). -      HYDROcodone -acetaminophen  (NORCO/VICODIN) 5-325 MG tablet; Take 1 tablet by mouth every 6 (six) hours as needed for moderate pain (pain score 4-6). -     HYDROcodone -acetaminophen  (NORCO/VICODIN) 5-325 MG tablet; Take 1 tablet  by mouth every 6 (six) hours as needed for moderate pain (pain score 4-6).    Immunization History  Administered Date(s) Administered   Fluad Quad(high Dose 65+) 08/28/2022   Fluad Trivalent(High Dose 65+) 09/03/2023   INFLUENZA, HIGH DOSE SEASONAL PF 09/30/2020, 09/22/2021, 08/30/2024   Influenza, Quadrivalent, Recombinant, Inj, Pf 09/12/2018   Influenza,inj,Quad PF,6+ Mos 08/27/2017, 09/12/2018, 08/23/2019   Influenza-Unspecified 10/17/2015, 09/05/2016, 08/23/2019   Moderna Sars-Covid-2 Vaccination 02/21/2020, 03/20/2020   PNEUMOCOCCAL CONJUGATE-20 08/17/2021   Pneumococcal Polysaccharide-23 03/18/2013, 06/19/2020   Td 05/03/1998, 05/30/2021   Tdap 03/21/2011   Zoster Recombinant(Shingrix) 09/12/2018, 01/13/2019    Health Maintenance  Topic Date Due   COVID-19 Vaccine (3 - Moderna risk series) 12/05/2024 (Originally 04/17/2020)   Mammogram  07/19/2025   Bone Density Scan  09/04/2025   Medicare Annual Wellness (AWV)  10/04/2025   Colonoscopy  08/24/2026   DTaP/Tdap/Td (4 - Td or Tdap) 05/31/2031   Pneumococcal Vaccine: 50+ Years  Completed   Influenza Vaccine  Completed   Hepatitis C Screening  Completed   Zoster Vaccines- Shingrix  Completed   Meningococcal B Vaccine  Aged Out    Discussed health benefits of physical activity, and encouraged her to engage in regular exercise appropriate for her age and condition.  Problem List Items Addressed This Visit       Cardiovascular and Mediastinum   Primary hypertension   Relevant Medications   hydrochlorothiazide  (HYDRODIURIL ) 25 MG tablet   losartan  (COZAAR ) 50 MG tablet   Other Relevant Orders   CBC with Differential/Platelet   CMP14+EGFR     Musculoskeletal and Integument   Generalized OA    Relevant Medications   HYDROcodone -acetaminophen  (NORCO/VICODIN) 5-325 MG tablet (Start on 12/06/2024)   HYDROcodone -acetaminophen  (NORCO/VICODIN) 5-325 MG tablet (Start on 01/05/2025)   HYDROcodone -acetaminophen  (NORCO/VICODIN) 5-325 MG tablet (Start on 02/04/2025)   DDD (degenerative disc disease), lumbar   Relevant Medications   HYDROcodone -acetaminophen  (NORCO/VICODIN) 5-325 MG tablet (Start on 12/06/2024)   HYDROcodone -acetaminophen  (NORCO/VICODIN) 5-325 MG tablet (Start on 01/05/2025)   HYDROcodone -acetaminophen  (NORCO/VICODIN) 5-325 MG tablet (Start on 02/04/2025)     Other   Controlled substance agreement signed   Relevant Medications   HYDROcodone -acetaminophen  (NORCO/VICODIN) 5-325 MG tablet (Start on 12/06/2024)   HYDROcodone -acetaminophen  (NORCO/VICODIN) 5-325 MG tablet (Start on 01/05/2025)   HYDROcodone -acetaminophen  (NORCO/VICODIN) 5-325 MG tablet (Start on 02/04/2025)   Hypokalemia   Relevant Medications   potassium chloride  (KLOR-CON ) 10 MEQ tablet   Other Relevant Orders   CMP14+EGFR   Other Visit Diagnoses       Routine general medical examination at a health care facility    -  Primary      Return in about 3 months (around 02/27/2025) for chronic follow up.   The patient indicates understanding of these issues and agrees with the plan.  Annabella CHRISTELLA Search, FNP      [1]  Outpatient Medications Prior to Visit  Medication Sig   albuterol  (VENTOLIN  HFA) 108 (90 Base) MCG/ACT inhaler INHALE 2 PUFFS INTO LUNGS EVERY 6 HOURS AS NEEDED FOR WHEEZING OR SHORTNESS OF BREATH   Calcium Carb-Cholecalciferol  (CALCIUM 600+D3 PO) Take 1 tablet by mouth 2 (two) times daily.   hydrochlorothiazide  (HYDRODIURIL ) 25 MG tablet Take 1 tablet by mouth once daily   HYDROcodone -acetaminophen  (NORCO/VICODIN) 5-325 MG tablet Take 1 tablet by mouth every 6 (six) hours as needed for moderate pain (pain score 4-6).   loratadine  (CLARITIN ) 10 MG tablet Take 10 mg by mouth daily.   losartan   (COZAAR ) 50 MG tablet Take  1 tablet (50 mg total) by mouth daily.   meclizine  (ANTIVERT ) 12.5 MG tablet Take 1 tablet (12.5 mg total) by mouth 3 (three) times daily as needed for dizziness.   meloxicam  (MOBIC ) 7.5 MG tablet Take 1 tablet by mouth once daily   naloxone  (NARCAN ) nasal spray 4 mg/0.1 mL Nasally for overdose   potassium chloride  (KLOR-CON ) 10 MEQ tablet Take 1 tablet by mouth once daily   No facility-administered medications prior to visit.   "

## 2024-12-06 ENCOUNTER — Telehealth: Payer: Self-pay | Admitting: Family Medicine

## 2024-12-06 ENCOUNTER — Ambulatory Visit: Payer: Self-pay | Admitting: Family Medicine

## 2024-12-06 NOTE — Telephone Encounter (Unsigned)
 Copied from CRM [id number]. Topic: Clinical - Lab/Test Results >> Dec 06, 2024  1:32 PM Kathy Barr wrote: Reason for CRM: patient returning call for Lab results read results from provider no additional questions at this time

## 2025-01-10 ENCOUNTER — Telehealth: Payer: Self-pay

## 2025-01-10 ENCOUNTER — Encounter: Admitting: Orthopedic Surgery

## 2025-01-10 DIAGNOSIS — I10 Essential (primary) hypertension: Secondary | ICD-10-CM

## 2025-01-10 DIAGNOSIS — E876 Hypokalemia: Secondary | ICD-10-CM

## 2025-01-10 NOTE — Telephone Encounter (Signed)
 Copied from CRM [id number]. Topic: Clinical - Prescription Issue >> Jan 10, 2025  9:15 AM Miquel SAILOR wrote: Reason for CRM: HYDROcodone -acetaminophen  (NORCO/VICODIN) 5-325 MG tablet (Starting on 02/04/2025) potassium chloride  (KLOR-CON ) 10 MEQ tablet losartan  (COZAAR ) 50 MG tablet   Franky from [facility]: Calling to see if office recieved fax from them. Needs call back [phone]

## 2025-01-10 NOTE — Telephone Encounter (Signed)
 TCB to [facility], we may have rcvd fax but we do not do these RFs unless pt authorizes us  that they have changed pharmacy's to them and they are not listed on pt's pharmacy list. Requested refills will not be sent. They will reach out to pt and pt will have to inform us  if she is changing pharmacy's.

## 2025-01-12 MED ORDER — POTASSIUM CHLORIDE ER 10 MEQ PO TBCR: 10.0000 meq | EXTENDED_RELEASE_TABLET | Freq: Every day | ORAL | 0 refills | Status: AC

## 2025-01-12 MED ORDER — LOSARTAN POTASSIUM 50 MG PO TABS: 50.0000 mg | ORAL_TABLET | Freq: Every day | ORAL | 2 refills | Status: AC

## 2025-01-12 NOTE — Telephone Encounter (Unsigned)
 Copied from CRM [id number]. Topic: Clinical - Prescription Issue >> Jan 10, 2025  9:15 AM Miquel SAILOR wrote: Reason for CRM: HYDROcodone -acetaminophen  (NORCO/VICODIN) 5-325 MG tablet (Starting on 02/04/2025) potassium chloride  (KLOR-CON ) 10 MEQ tablet losartan  (COZAAR ) 50 MG tablet   Franky from [facility]: Calling to see if office recieved fax from them. Needs call back [phone] >> Jan 12, 2025 10:32 AM Emylou G wrote: Patient called .SABRA Said she is okay with the mail order service for her refills.. that way she doesn't have to drive.. Can you clarify with her if she can still get her pain meds via the mail or does still needs to go get them herself?

## 2025-01-12 NOTE — Telephone Encounter (Signed)
 Informed pt that Losartan  & Potassium were sent to [facility] but it is best to keep controlled medications at a local pharmacy. Pt verbalizes understanding.

## 2025-01-12 NOTE — Addendum Note (Signed)
 Addended by: INA RAMP D on: 01/12/2025 01:50 PM   Modules accepted: Orders
